# Patient Record
Sex: Male | Born: 1965 | Race: White | Hispanic: No | Marital: Single | Smoking: Never smoker
Health system: Southern US, Community
[De-identification: ages and names within clinical notes are randomized; demographics above are authoritative.]

## PROBLEM LIST (undated history)

## (undated) DIAGNOSIS — K649 Unspecified hemorrhoids: Secondary | ICD-10-CM

## (undated) DIAGNOSIS — I214 Non-ST elevation (NSTEMI) myocardial infarction: Secondary | ICD-10-CM

## (undated) DIAGNOSIS — T8859XA Other complications of anesthesia, initial encounter: Secondary | ICD-10-CM

## (undated) DIAGNOSIS — I10 Essential (primary) hypertension: Secondary | ICD-10-CM

## (undated) DIAGNOSIS — K8020 Calculus of gallbladder without cholecystitis without obstruction: Secondary | ICD-10-CM

## (undated) DIAGNOSIS — K824 Cholesterolosis of gallbladder: Secondary | ICD-10-CM

## (undated) DIAGNOSIS — T4145XA Adverse effect of unspecified anesthetic, initial encounter: Secondary | ICD-10-CM

## (undated) DIAGNOSIS — R112 Nausea with vomiting, unspecified: Secondary | ICD-10-CM

## (undated) DIAGNOSIS — I2510 Atherosclerotic heart disease of native coronary artery without angina pectoris: Secondary | ICD-10-CM

## (undated) DIAGNOSIS — Z9889 Other specified postprocedural states: Secondary | ICD-10-CM

## (undated) DIAGNOSIS — K219 Gastro-esophageal reflux disease without esophagitis: Secondary | ICD-10-CM

## (undated) DIAGNOSIS — F419 Anxiety disorder, unspecified: Secondary | ICD-10-CM

## (undated) HISTORY — DX: Anxiety disorder, unspecified: F41.9

## (undated) HISTORY — DX: Cholesterolosis of gallbladder: K82.4

## (undated) HISTORY — PX: HEMORROIDECTOMY: SUR656

## (undated) HISTORY — DX: Gastro-esophageal reflux disease without esophagitis: K21.9

## (undated) HISTORY — DX: Calculus of gallbladder without cholecystitis without obstruction: K80.20

## (undated) HISTORY — DX: Non-ST elevation (NSTEMI) myocardial infarction: I21.4

## (undated) HISTORY — PX: KNEE ARTHROSCOPY: SUR90

---

## 2001-02-12 ENCOUNTER — Encounter: Payer: Self-pay | Admitting: Orthopedic Surgery

## 2001-02-16 ENCOUNTER — Encounter: Payer: Self-pay | Admitting: Orthopedic Surgery

## 2001-02-16 ENCOUNTER — Ambulatory Visit (HOSPITAL_COMMUNITY): Admission: RE | Admit: 2001-02-16 | Discharge: 2001-02-16 | Payer: Self-pay | Admitting: Orthopedic Surgery

## 2001-02-24 ENCOUNTER — Ambulatory Visit (HOSPITAL_COMMUNITY): Admission: RE | Admit: 2001-02-24 | Discharge: 2001-02-24 | Payer: Self-pay | Admitting: Orthopedic Surgery

## 2002-01-27 ENCOUNTER — Ambulatory Visit (HOSPITAL_COMMUNITY): Admission: RE | Admit: 2002-01-27 | Discharge: 2002-01-27 | Payer: Self-pay | Admitting: Family Medicine

## 2003-03-17 ENCOUNTER — Ambulatory Visit (HOSPITAL_COMMUNITY): Admission: RE | Admit: 2003-03-17 | Discharge: 2003-03-17 | Payer: Self-pay | Admitting: General Surgery

## 2005-02-12 ENCOUNTER — Ambulatory Visit: Payer: Self-pay | Admitting: Orthopedic Surgery

## 2005-02-14 ENCOUNTER — Ambulatory Visit (HOSPITAL_COMMUNITY): Admission: RE | Admit: 2005-02-14 | Discharge: 2005-02-14 | Payer: Self-pay | Admitting: Orthopedic Surgery

## 2005-02-21 ENCOUNTER — Ambulatory Visit: Payer: Self-pay | Admitting: Orthopedic Surgery

## 2005-03-06 ENCOUNTER — Encounter (HOSPITAL_COMMUNITY): Admission: RE | Admit: 2005-03-06 | Discharge: 2005-04-05 | Payer: Self-pay | Admitting: Orthopedic Surgery

## 2005-03-12 ENCOUNTER — Ambulatory Visit: Payer: Self-pay | Admitting: Orthopedic Surgery

## 2007-12-26 ENCOUNTER — Ambulatory Visit (HOSPITAL_COMMUNITY): Admission: RE | Admit: 2007-12-26 | Discharge: 2007-12-26 | Payer: Self-pay | Admitting: Internal Medicine

## 2008-02-04 ENCOUNTER — Ambulatory Visit: Payer: Self-pay | Admitting: Internal Medicine

## 2008-02-06 HISTORY — PX: ESOPHAGOGASTRODUODENOSCOPY: SHX1529

## 2008-02-06 HISTORY — PX: BRAVO PH STUDY: SHX5421

## 2008-02-09 ENCOUNTER — Ambulatory Visit: Payer: Self-pay | Admitting: Internal Medicine

## 2008-02-09 ENCOUNTER — Ambulatory Visit (HOSPITAL_COMMUNITY): Admission: RE | Admit: 2008-02-09 | Discharge: 2008-02-09 | Payer: Self-pay | Admitting: Internal Medicine

## 2008-03-16 ENCOUNTER — Ambulatory Visit: Payer: Self-pay | Admitting: Internal Medicine

## 2008-09-16 ENCOUNTER — Ambulatory Visit: Payer: Self-pay | Admitting: Internal Medicine

## 2009-01-24 ENCOUNTER — Telehealth (INDEPENDENT_AMBULATORY_CARE_PROVIDER_SITE_OTHER): Payer: Self-pay | Admitting: *Deleted

## 2009-01-25 ENCOUNTER — Encounter: Payer: Self-pay | Admitting: Internal Medicine

## 2009-07-01 ENCOUNTER — Ambulatory Visit (HOSPITAL_COMMUNITY): Admission: RE | Admit: 2009-07-01 | Discharge: 2009-07-01 | Payer: Self-pay | Admitting: Family Medicine

## 2010-02-27 DIAGNOSIS — K219 Gastro-esophageal reflux disease without esophagitis: Secondary | ICD-10-CM | POA: Insufficient documentation

## 2010-02-27 DIAGNOSIS — K648 Other hemorrhoids: Secondary | ICD-10-CM | POA: Insufficient documentation

## 2010-02-27 DIAGNOSIS — K5900 Constipation, unspecified: Secondary | ICD-10-CM | POA: Insufficient documentation

## 2010-02-28 ENCOUNTER — Ambulatory Visit: Payer: Self-pay | Admitting: Internal Medicine

## 2010-03-09 ENCOUNTER — Encounter: Payer: Self-pay | Admitting: Gastroenterology

## 2010-09-21 ENCOUNTER — Encounter (INDEPENDENT_AMBULATORY_CARE_PROVIDER_SITE_OTHER): Payer: Self-pay | Admitting: *Deleted

## 2010-11-07 NOTE — Medication Information (Signed)
Summary: PA for dexilant  PA for dexilant   Imported By: Hendricks Limes LPN 16/07/9603 54:09:81  _____________________________________________________________________  External Attachment:    Type:   Image     Comment:   External Document

## 2010-11-07 NOTE — Assessment & Plan Note (Signed)
Summary: MEDICATIONS REFILLS/SS   Visit Type:  Follow-up Visit Primary Care Provider:  [facility]  Chief Complaint:  F/U medications.  History of Present Illness: Pleasant 45 year old gentleman with long-standing gastroesophageal reflux disease. Here for two-year followup. Symptoms well-controlled on Dexalant 60 mg orally daily. Over time, he notes if he misses even a single dose of Dexilant, within  24 hours, he has recurrent symptoms. He has not had any melena or dysphagia. He has gained almost 20 pounds since his last visit.  Due for routine screening colonoscopy at age 63. He has not had any intercurrent illnesses.  Current Medications (verified): 1)  Alprazolam 0.5 Mg Tabs (Alprazolam) .... As Needed 2)  Multivitamins  Tabs (Multiple Vitamin) .... Once Daily 3)  Dexilant 60 Mg .... Take 1 Tablet By Mouth Once A Day  Allergies (verified): No Known Drug Allergies  Past History:  Family History: Last updated: 02/28/2010 Father: Living age 69   healthy Mother: Living age 41  healthy Siblings: None  Social History: Last updated: 02/28/2010 Marital Status:single Children: none Occupation: Maintenance  Past Medical History: Reflux Anxiety Allergies  Past Surgical History: HEMORRHOIDECTOMY LEFT KNEE SURGERY  Family History: Father: Living age 71   healthy Mother: Living age 22  healthy Siblings: None  Social History: Marital Status:single Children: none Occupation: Maintenance  Vital Signs:  Patient profile:   45 year old male Height:      72 inches Weight:      197 pounds BMI:     26.81 Temp:     98.3 degrees F oral Pulse rate:   84 / minute BP sitting:   134 / 88  (left arm) Cuff size:   regular  Vitals Entered By: Cloria Spring LPN (Feb 28, 2010 10:21 AM)  Physical Exam  General:  pleasant alert gentleman in no acute distress Eyes:  no scleral icterus. Conjunctivae were pink Abdomen:  nondistended positive bowel sounds soft nontender without  appreciable mass or organomegaly  Impression & Recommendations: Impression: 45 year old gentleman who has  well controlled GERD. He is gaining weight. I reviewed the multi-pronged approach to reflux with this gentleman today.  Recommendations: Continue Dexalant 60 mg orally daily -  two-year prescription refill provided. I feel the benefits of this approach far outweigh the risks. Weight loss encouraged. Unless something comes up, will plan to see him back in 2 years.  Appended Document: Orders Update    Clinical Lists Changes  Orders: Added new Service order of Est. Patient Level III (16109) - Signed      Appended Document: MEDICATIONS REFILLS/SS reminder in computer

## 2010-11-09 NOTE — Letter (Signed)
Summary: Recall Office Visit  [facility]  [address]   [place], [place] [address]   Phone: [phone]  Fax: [phone]      September 21, 2010   NORMAL RECINOS [address] [place], [place]  [address] 1966/06/25   Dear Mr. Bussie,   According to our records, it is time for you to schedule a follow-up office visit with [place].   At your convenience, please call [phone] to schedule an office visit. If you have any questions, concerns, or feel that this letter is in error, we would appreciate your call.   Sincerely,    Diana Eves  [facility] Ph: [phone]   Fax: [phone]

## 2011-02-20 NOTE — Assessment & Plan Note (Signed)
NAMEMarland Kitchen  Eric Fisher, Eric Fisher              CHART#:  [id number]   DATE:  09/16/2008                       DOB:  1966-10-06   FOLLOWUP:  Gastroesophageal reflux disease.   The patient was last seen on 03/16/2008, in followup who is having  refractory reflux symptoms manifested primarily by atypical chest pain.  Prior cardiac workup negative. The Bravo pH study demonstrated abnormal  gastroesophageal reflux.  These symptoms, however, now have been nicely  squelched with Kapidex 60 mg orally daily.  He is essentially  asymptomatic these days.  He tells me he was taking Kapidex first thing  in the morning on 2 occasions since June and had recurrence of his  symptoms by me today.  He is not having odynophagia or dysphagia.  Overall, he is very pleased with his response with Kapidex.  There is no  family history of GI malignancies.  No tobacco or alcohol use.   PHYSICAL EXAMINATION:  GENERAL:  Today, a pleasant 45 year old  gentleman, resting comfortably.  VITAL SIGNS:  Weight 178.5, height 6 feet, temperature 97.8, BP 120/78,  and pulse 72.  SKIN:  Warm and dry.  ABDOMEN:  Flat, positive bowel sounds, soft, nontender without  appreciable mass or organomegaly.   ASSESSMENT:  Gastroesophageal reflux disease with atypical chest pain.  His primary symptom resolved on Kapidex 60 mg orally daily.  We had a  discussion about multiform approaches for treatment of gastroesophageal  reflux disease.  Given his relatively young age, I did talk him briefly  about the option of antireflux procedure unless it could potentially  give him a long-lasting symptom relief, but likely would not be a  lifelong fix.  The potential for new problems related reflux surgery are  also discussed.  I told the patient it was entirely safe for him to plan  to take Kapidex for a long haul, if he did not mind taking an extra  capsule daily.  He is variable content with taking this approach for the  time being.  He consequently  have recommended he just simply stay on  Kapidex 60 mg orally daily.  He will see that he has refills for the  next 2 years.  Unless something comes up, I will touch base within 2  years from now.      [name], M.D.  Electronically Signed    RMR/MEDQ  D:  09/16/2008  T:  09/16/2008  Job:  [id number]   cc:   Patrica Duel, M.D.

## 2011-02-20 NOTE — Consult Note (Signed)
NAME:  Eric Fisher, PANIK NO.:  [id number]   MEDICAL RECORD NO.:  [id number]          PATIENT TYPE:  AMB   LOCATION:  DAY                           FACILITY:  [facility]   PHYSICIAN:  R. Roetta Sessions, M.D. DATE OF BIRTH:  [birth year]   DATE OF CONSULTATION:  02/04/2008  DATE OF DISCHARGE:                                 CONSULTATION   REASON FOR CONSULTATION:  Noncardiac chest pain.   HISTORY OF PRESENT ILLNESS:  The patient is a 45 year old Caucasian  gentleman who presents today for further evaluation of what was thought  to be a noncardiac chest pain.  He says over the past 9 weeks, he has  developed chest tightening and discomfort in his chest.  He states he  feels like he has a 25-pound weight on his chest.  He states the pain is  constant.  If he plays golf, it seems to be a little bit worse.  However, he states he is able to walk and exert himself without any  increased discomfort.  He denies any shortness of breath.  When this  occurs, he feels nauseated and loses his appetite.  He states in the  beginning, he lost about 12 to 15 pounds within the first couple of week  of these symptoms.  He denies any vomiting.  Every 3 to 4 days, he will  also have a sharper pain, which migrates throughout the chest left to  the right side.  He denies any typical heartburn or indigestion.  He has  had sensation of something in his throat over the past several weeks.  This seems to be unrelated to his chest discomfort.  He sometimes does a  hard cough and has a thick mucus come out.  He says his food is going  down okay.  His bowel movements are regular.  He denies any blood in the  stool.  He has been tried on Aciphex for about 2 weeks, Protonix for  about 3 weeks, currently on omeprazole, and has not noticed any  improvement with any of these medications.  He has taken Maalox on one  occasion which seemed to help but has not helped on subsequent  occasions.  He has been  evaluated by Dr. Allyson Sabal.  He reports having an  unremarkable echocardiogram and stress test.  He states all of his blood  work and thyroid check was normal.  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  Labs from December 24, 2007, revealed a normal CBC,  sed rate, the fibrin D-dimer was 0.22.  MET 7, LFTs, and TSH all were  normal.   CURRENT MEDICATIONS:  1. Omeprazole 20 mg daily.  2. Xanax 0.5 mg p.r.n.  3. Fluticasone 50 mcg daily.   ALLERGIES:  No known drug allergies.   PAST MEDICAL HISTORY:  1. He was treated for sinus infection several weeks ago.  2. History of hemorrhoids.  3. He has had hemorrhoid surgery.  4. Left knee surgery.  5. The patient has had a flexible sigmoidoscopy in February 2001,      which revealed internal hemorrhoids, otherwise normal study to 50      cm.   FAMILY HISTORY:  Negative for chronic GI illnesses and colorectal  cancers.   SOCIAL HISTORY:  He is single.  He does maintenance work.  He has never  been a smoker.  No alcohol use.   REVIEW OF SYSTEMS:  See HPI for GI.  CONSTITUTIONAL:  See HPI.  CARDIOPULMONARY:  See HPI.  GENITOURINARY:  No dysuria or hematuria.   PHYSICAL EXAMINATION:  VITAL SIGNS:  Weight 169, height 6 feet,  temperature 98.1, blood pressure 120/88, and pulse 88.  GENERAL:  Pleasant, well-nourished, and well-developed Caucasian  gentleman in no acute distress.  SKIN:  Warm and dry.  No jaundice.  HEENT:  Sclerae nonicteric.  Oropharyngeal mucosa moist and pink.  No  lesions, erythema, or exudate.  NECK:  No lymphadenopathy or thyromegaly.  CHEST:  Lungs are clear to auscultation.  CARDIAC:  Reveals regular rate and rhythm.  Normal S1 and S2.  No  murmurs, rubs, or gallops.  ABDOMEN:  Positive bowel sounds.   Abdomen is soft, nontender, and  nondistended.  No organomegaly or masses.  No rebound or guarding.  No  abdominal bruits or hernias.  LOWER EXTREMITIES:  No edema.   IMPRESSION:  Shrihaan is a 46 year old gentleman with 8- to 9-week  history of atypical chest pain.  He describes having tightness in his  chest as well as feeling like a weight on his chest.  His symptoms are  not exertional.  He has associated nausea and anorexia.  He had a 12- to  15-pound weight loss initially, but this has now stabilized.  He also  describes globus-type symptoms.  He denies any dysphagia.  He denies any  typical heartburn-like symptoms.  He has not responded to PPI therapy as  outlined above.  I feel that it is unlikely that his chest pain is  related to reflux, given that he has been on multiple PPIs without any  response.   PLAN:  1. EGD in the near future with Dr. Jena Gauss.  If his EGD is normal      endoscopically, then we would proceed Bravo pH testing off the PPI      therapy.  Therefore, we will have the patient hold his omeprazole      for now.  2. Further recommendations to follow.   I would like to thank Dr. Jena Gauss  for allowing [place] to take part in the  care of this patient.      Tana Coast, P.AJonathon Bellows, M.D.  Electronically Signed    LL/MEDQ  D:  02/04/2008  T:  02/05/2008  Job:  [id number]   cc:   Patrica Duel, M.D.  Fax: [phone]

## 2011-02-20 NOTE — Op Note (Signed)
NAME:  Eric Fisher, Eric Fisher NO.:  [id number]   MEDICAL RECORD NO.:  [id number]          PATIENT TYPE:  AMB   LOCATION:  DAY                           FACILITY:  [facility]   PHYSICIAN:  R. Roetta Sessions, M.D. DATE OF BIRTH:  14-Jan-1966   DATE OF PROCEDURE:  02/09/2008  DATE OF DISCHARGE:                               OPERATIVE REPORT   Esophagogastroduodenoscopy with Bravo probe placement.   INDICATIONS FOR PROCEDURE:  A 45 year old gentleman with atypical chest  pain.  Cardiac workup has been unrevealing.  He does have typical reflux  symptoms and really has not had any meaningful response to the empiric  acid suppression therapy.  EGD with Bravo placement as appropriate is  now being done.  Risks, benefits, and alternatives have been reviewed.  Please see documentation in the medical record.   PROCEDURE NOTE:  O2 saturation, blood pressure, pulse, and respirations  were monitored throughout the entire procedure.   CONSCIOUS SEDATION:  Versed 7 mg IV and Demerol 125 mg IV in divided  doses.   INSTRUMENT:  Pentax video chip system.   FINDINGS:  Examination of the tubular esophagus revealed normal-  appearing mucosa.  EG junction identified at 41 cm/42 cm from the  incisors with the stomach inflated and deflated respectively.  The EG  junction was easily traversed.  Stomach:  Gastric cavity was empty and  insufflated well with air.  Thorough examination of the gastric mucosa  including retroflex view of the proximal stomach and esophagogastric  junction demonstrated only a small hiatal hernia.  Pylorus was patent  and easily traversed.  Examination of the bulb and second portion  revealed no abnormalities.   THERAPEUTIC/DIAGNOSTIC MANEUVERS PERFORMED:  Scope was withdrawn.  The  Bravo introducing deployment catheter was obtained after the Bravo probe  had been calibrated.  It was advanced blindly to 36 cm with the  incisors.  It was deployed in standard fashion.  A  look back revealed  the probe was indeed attached to the mucosa at the proper level.  The  patient tolerated the procedure well.   IMPRESSION:  Normal esophagus, small hiatal hernia, otherwise normal  stomach, D1 and D2, status post Bravo pH probe placement as described  above.   RECOMMENDATIONS:  No acid suppression whatsoever for the next 2 days  (during the stay).  The patient is to go back to his usual routine.  Keep the diary as instructed by nursing staff.  Further recommendations  to follow.      [name], M.D.  Electronically Signed     RMR/MEDQ  D:  02/09/2008  T:  02/10/2008  Job:  [id number]   cc:   Patrica Duel, M.D.  Fax: [phone]

## 2011-02-20 NOTE — Assessment & Plan Note (Signed)
NAME:  Eric Fisher, Eric Fisher              CHART#:  [id number]   DATE:  03/16/2008                       DOB:  10/15/1965   PRIMARY CARE PHYSICIAN:  Patrica Duel, MD.   CHIEF COMPLAINT:  Followup refractory GERD/atypical chest pain.   PROBLEM LIST:  1. Chronic refractory GERD with positive Bravo pH study by Dr. Jena Gauss      on Feb 09, 2008.  He had abnormal GERD documented during the study      and 2/3 episodes of chest pain correlated with reflux.  2. Constipation since the addition of Kapidex.  3. Hemorrhoidectomy.  4. Flexible sigmoidoscopy in February 2001, internal hemorrhoids;      otherwise, normal exam to 50 cm.   SUBJECTIVE:  Eric Fisher is a 45 year old Caucasian male.  He had recent  abnormal Bravo pH study off PPI.  He has been taking Kapidex 60 mg  b.i.d. for over a month now.  He is feeling much better.  He did have  one episode where he went out to eat pizza and a salad of pineapple and  pepperonis.  He did seem to have some chest pain.  He also notes that  this morning he began to note some chest tightness as well.  The  tightness is not associated with any shortness of breath, cough,  wheezing, dizziness, or palpitations.  He has previously had a normal  cardiac evaluation by [facility] which included a stress  test, echocardiogram, and chest CT; all of which were benign.  He denies  any abdominal pain, does have some transient nausea with these episodes,  but denies any vomiting.  His weight has remained stable.  His appetite  is good.  He denies any dysphagia or odynophagia.  He has noted some  constipation.  He can go up to 4 days without bowel movement since he  started Kapidex; however, he began using fiber 1 cereal and this is no  longer a problem.   CURRENT MEDICATIONS:  See the list from March 16, 2008.   ALLERGIES:  No known drug allergies.   OBJECTIVE:  VITAL SIGNS:  Weight 167.5 pounds, height 72 inches,  temperature 98.1, blood pressure 118/80,  and pulse 80.  GENERAL:  Eric Fisher is a well-developed, well-nourished Caucasian  male, in no acute distress.  HEENT:  Sclerae nonicteric.  Conjunctivae pink.  Oropharynx pink and  moist without any lesions.  NECK:  Supple without mass or thyromegaly.  CHEST:  Heart is regular rate and rhythm.  Normal S1, S2.  No murmurs,  clicks, rubs, or gallops.  Lungs are clear to auscultation bilaterally.  ABDOMEN:  Positive bowel sounds x4.  No bruits auscultated.  Soft,  nontender, and nondistended.  No palpable mass or hepatosplenomegaly.  No rebound, tenderness, or guarding.  EXTREMITIES:  Without clubbing or edema bilaterally.   ASSESSMENT:  Eric Fisher is a 45 year old male with refractory  gastroesophageal reflux disease, which has responded to b.i.d. proton  pump inhibitor.  He does have persistent symptom of intermittent chest  pressure, which is not necessarily related to movement and no associated  factors, such as diaphoresis, shortness of breath, cough, wheezing,  dizziness, or palpitations.  Much of this has resolved since he began  b.i.d. proton pump inhibitor; however, I am not completely convinced  that this is all due to  gastroesophageal reflux disease.   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   Constipation with Kapidex.   PLAN:  1. Fiber supplement of choice.  2. GERD diet and constipation literature given for his review.  3. Continue Kapidex 60 mg.  We will decrease to once daily since he      has relatively good control of his symptoms, #90, with 3 refills.  4. .  If his chest pressure persists, he is going to call for further      evaluation.       Lorenza Burton, N.P.  Electronically Signed     R. Roetta Sessions, M.D.  Electronically Signed    KJ/MEDQ  D:  03/16/2008  T:  03/17/2008  Job:  [id number]   cc:   Patrica Duel, M.D.

## 2011-02-20 NOTE — Op Note (Signed)
NAME:  Eric Fisher, Eric Fisher NO.:  [id number]   MEDICAL RECORD NO.:  [id number]          PATIENT TYPE:  AMB   LOCATION:  DAY                           FACILITY:  [facility]   PHYSICIAN:  R. Roetta Sessions, M.D. DATE OF BIRTH:  10-05-1966   DATE OF PROCEDURE:  02/09/2008  DATE OF DISCHARGE:  02/09/2008                               OPERATIVE REPORT   INDICATIONS FOR PROCEDURE:  Chest pain with unclear etiology.  This is a  45 year old gentleman with a 9-week history of atypical chest pain.  Cardiac workup negative.  He has had some anorexia and nausea.  He has  failed to improve on short-term aspiration therapy in the way of  Protonix and Aciphex.  He has had no dysphagia.  A 48-hour ambulatory pH  probe study is now being done off acid suppression therapy.  This  approach has been discussed with the patient at length.   FINDINGS:  EGD Feb 09, 2008.  Please see dictated procedure note.  The  patient had a small hiatal hernia.  This procedure was done off acid  suppression therapy.  Findings on day #1:  Number of reflux episodes  155, number of long reflux symptoms greater than 5 minutes 1, duration  of longest reflux episode minutes 29.  Time of pH less than 4 minutes  131, fraction time pH less than four 10.4, DeMeester score 41.7.  Day  #2:  Number of reflux episodes 51, number of longest reflux episode  greater than 5 minutes 2, duration of longest reflux in minutes 9, pH  less than 4 minutes 41, fraction time pH less than four 3.3, DeMeester  score 12.4.   On reviewing the patient's diary, the patient had 3 episodes of chest  pain during the study.  Two of which correlated with reflux, one did  not.  The patient was otherwise virtually asymptomatic during the study.   IMPRESSION:  The patient has abnormal gastroesophageal reflux documented  during the study.   However, he only had 3 episodes of chest pain during the 2 days.  Two of  which were correlated with episodes of  reflux.   RECOMMENDATIONS:  The patient deserves a longer course of more  aggressive acid suppression therapy.  Acid reflux may be a major  contributing factor to his recent chest pain.  I specifically recommend  Capadex 60 mg orally twice daily for the next month.  We will see him  back in the office in 1 month to assess his progress.      [name], M.D.  Electronically Signed     RMR/MEDQ  D:  02/16/2008  T:  02/16/2008  Job:  [id number]

## 2011-02-23 NOTE — H&P (Signed)
   NAME:  Eric Fisher, Eric Fisher                       ACCOUNT NO.:  [id number]   MEDICAL RECORD NO.:  [id number]                   PATIENT TYPE:  AMB   LOCATION:  DAY                                  FACILITY:  [facility]   PHYSICIAN:  Dalia Heading, M.D.               DATE OF BIRTH:  January 17, 1966   DATE OF ADMISSION:  DATE OF DISCHARGE:                                HISTORY & PHYSICAL   CHIEF COMPLAINT:  Thrombosed hemorrhoid.   HISTORY OF PRESENT ILLNESS:  The patient is a 45 year old white male who was  referred for evaluation, treatment of a thrombosed hemorrhoid.  He has had  hemorrhoidal problems in the past, but recently the rectal pain has been  worsening over the past three days.  He does notice a swelling in the  rectum.  No active bleeding is noted.   PAST MEDICAL HISTORY:  Unremarkable.   PAST SURGICAL HISTORY:  Knee surgery.   CURRENT MEDICATIONS:  A pain medication.   ALLERGIES:  No known drug allergies.   REVIEW OF SYSTEMS:  Noncontributory.   PHYSICAL EXAMINATION:  GENERAL:  The patient is a well-developed, well-  nourished,  white male in no acute distress.  VITAL SIGNS:  He is afebrile and vital signs are stable.  LUNGS:  Clear to auscultation with equal breath sounds bilaterally.  HEART:  Reveals a regular rate and rhythm without S3, S4 or murmurs.  RECTAL:  Reveals a thrombosed hemorrhoid noted along the left lateral aspect  of the anus.  It is tender to touch.  Examination is limited secondary to  pain.   IMPRESSION:  Thrombosed hemorrhoid.    PLAN:  The patient was scheduled for a hemorrhoidectomy on March 17, 2003.  The risks and benefits of the procedure including bleeding, infection and  recurrence of the hemorrhoids were fully explained to the patient.  He gave  informed consent.                                                 Dalia Heading, M.D.    MAJ/MEDQ  D:  03/16/2003  T:  03/16/2003  Job:  [id number]   cc:   Patrica Duel, M.D.  [address], [address]  [place]  [place] [address]  Fax: [phone]

## 2011-02-23 NOTE — Op Note (Signed)
   NAME:  OZ, GAMMEL                       ACCOUNT NO.:  [id number]   MEDICAL RECORD NO.:  [id number]                   PATIENT TYPE:  AMB   LOCATION:  DAY                                  FACILITY:  [facility]   PHYSICIAN:  Dalia Heading, M.D.               DATE OF BIRTH:  May 30, 1966   DATE OF PROCEDURE:  03/17/2003  DATE OF DISCHARGE:                                 OPERATIVE REPORT   PREOPERATIVE DIAGNOSIS:  Thrombosed hemorrhoid.   POSTOPERATIVE DIAGNOSIS:  Thrombosed hemorrhoid.   PROCEDURE:  Internal and external hemorrhoidectomy.   SURGEON:  Dalia Heading, M.D.   ANESTHESIA:  General   INDICATIONS:  The patient is a 45 year old white male who presents with a  thrombosed hemorrhoid along the left lateral aspect of the anus.  The risks  and benefits of the procedure including bleeding, infection, pain, and the  possibility of recurrence of the hemorrhoid were fully explained to the  patient, who gave informed consent.   DESCRIPTION OF PROCEDURE:  The patient was placed in the lithotomy position  after general anesthesia was administered. The perineum was prepped and  draped usual sterile technique with Betadine.  Surgical site confirmation  was performed.   The patient had a large internal and external hemorrhoid noted along the  left lateral aspect of the anus. This was excised sharply without  difficulty.  The mucosal edges were reapproximated using a running 2-0  Vicryl running suture.  The rest of the rectum and anus were within normal  limits.  Sensorcaine 0.5% was instilled into the surrounding wound.  No  abnormal bleeding was noted at the end of the procedure.  The rectum was  packed with Surgicel and viscous Xylocaine.   All tape and needle counts correct at the end of the procedure.  The patient  was awakened and transferred to PACU in stable condition.   COMPLICATIONS:  None.    SPECIMEN:  Thrombosed hemorrhoid.   BLOOD LOSS:  Minimal.                                         Dalia Heading, M.D.    MAJ/MEDQ  D:  03/17/2003  T:  03/17/2003  Job:  [id number]   cc:   Patrica Duel, M.D.  [address]  [place]  [place] [address]  Fax: [phone]

## 2011-03-09 DIAGNOSIS — I214 Non-ST elevation (NSTEMI) myocardial infarction: Secondary | ICD-10-CM

## 2011-03-09 HISTORY — DX: Non-ST elevation (NSTEMI) myocardial infarction: I21.4

## 2011-03-11 ENCOUNTER — Inpatient Hospital Stay (HOSPITAL_COMMUNITY)
Admission: EM | Admit: 2011-03-11 | Discharge: 2011-03-16 | DRG: 122 | Disposition: A | Discharge status: Home / Self Care | Payer: BC Managed Care – PPO | Source: Other Acute Inpatient Hospital | Attending: Internal Medicine | Admitting: Internal Medicine

## 2011-03-11 ENCOUNTER — Emergency Department (HOSPITAL_COMMUNITY)
Admission: EM | Admit: 2011-03-11 | Discharge: 2011-03-11 | Disposition: A | Discharge status: Other Acute Inpatient Hospital | Payer: BC Managed Care – PPO | Attending: Emergency Medicine | Admitting: Emergency Medicine

## 2011-03-11 ENCOUNTER — Emergency Department (HOSPITAL_COMMUNITY): Payer: BC Managed Care – PPO

## 2011-03-11 DIAGNOSIS — I2510 Atherosclerotic heart disease of native coronary artery without angina pectoris: Secondary | ICD-10-CM

## 2011-03-11 DIAGNOSIS — R079 Chest pain, unspecified: Secondary | ICD-10-CM

## 2011-03-11 DIAGNOSIS — R7309 Other abnormal glucose: Secondary | ICD-10-CM | POA: Diagnosis present

## 2011-03-11 DIAGNOSIS — Z7901 Long term (current) use of anticoagulants: Secondary | ICD-10-CM

## 2011-03-11 DIAGNOSIS — R0602 Shortness of breath: Secondary | ICD-10-CM | POA: Insufficient documentation

## 2011-03-11 DIAGNOSIS — I219 Acute myocardial infarction, unspecified: Secondary | ICD-10-CM | POA: Insufficient documentation

## 2011-03-11 DIAGNOSIS — K219 Gastro-esophageal reflux disease without esophagitis: Secondary | ICD-10-CM | POA: Diagnosis present

## 2011-03-11 DIAGNOSIS — Z7982 Long term (current) use of aspirin: Secondary | ICD-10-CM

## 2011-03-11 DIAGNOSIS — I214 Non-ST elevation (NSTEMI) myocardial infarction: Principal | ICD-10-CM | POA: Diagnosis present

## 2011-03-11 HISTORY — PX: CARDIAC CATHETERIZATION: SHX172

## 2011-03-11 LAB — BASIC METABOLIC PANEL
CO2: 24 mEq/L (ref 19–32)
Chloride: 105 mEq/L (ref 96–112)
Creatinine, Ser: 0.7 mg/dL (ref 0.4–1.5)
GFR calc Af Amer: >60 mL/min (ref >60)
Glucose, Bld: 130 mg/dL — ABNORMAL HIGH (ref 70–99)

## 2011-03-11 LAB — DIFFERENTIAL
Basophils Relative: 0 % (ref 0–1)
Eosinophils Absolute: 0.1 10*3/uL (ref 0.0–0.7)
Eosinophils Relative: 1 % (ref 0–5)
Monocytes Absolute: 0.5 10*3/uL (ref 0.1–1.0)
Monocytes Relative: 6 % (ref 3–12)
Neutro Abs: 6 10*3/uL (ref 1.7–7.7)

## 2011-03-11 LAB — CARDIAC PANEL(CRET KIN+CKTOT+MB+TROPI)
Relative Index: 9.3 — ABNORMAL HIGH (ref 0.0–2.5)
Troponin I: >25 ng/mL (ref <0.30)

## 2011-03-11 LAB — CBC
Hemoglobin: 13 g/dL (ref 13.0–17.0)
MCH: 29.9 pg (ref 26.0–34.0)
MCHC: 34.6 g/dL (ref 30.0–36.0)
RDW: 12.8 % (ref 11.5–15.5)

## 2011-03-11 LAB — GLUCOSE, CAPILLARY: Glucose-Capillary: 99 mg/dL (ref 70–99)

## 2011-03-11 LAB — MRSA PCR SCREENING: MRSA by PCR: NEGATIVE

## 2011-03-11 LAB — CK TOTAL AND CKMB (NOT AT ARMC)
CK, MB: 32.4 ng/mL (ref 0.3–4.0)
Relative Index: 8.5 — ABNORMAL HIGH (ref 0.0–2.5)
Total CK: 241 U/L — ABNORMAL HIGH (ref 7–232)

## 2011-03-12 LAB — CBC
Platelets: 145 10*3/uL — ABNORMAL LOW (ref 150–400)
RBC: 4.22 MIL/uL (ref 4.22–5.81)
WBC: 7.5 10*3/uL (ref 4.0–10.5)

## 2011-03-12 LAB — LIPID PANEL
HDL: 41 mg/dL (ref >39)
LDL Cholesterol: 93 mg/dL (ref 0–99)
Triglycerides: 125 mg/dL (ref <150)

## 2011-03-12 LAB — BASIC METABOLIC PANEL
GFR calc Af Amer: >60 mL/min (ref >60)
GFR calc non Af Amer: >60 mL/min (ref >60)
Potassium: 3.6 mEq/L (ref 3.5–5.1)
Sodium: 139 mEq/L (ref 135–145)

## 2011-03-12 LAB — HEPARIN LEVEL (UNFRACTIONATED): Heparin Unfractionated: 0.3 IU/mL (ref 0.30–0.70)

## 2011-03-13 DIAGNOSIS — I219 Acute myocardial infarction, unspecified: Secondary | ICD-10-CM

## 2011-03-13 HISTORY — PX: TRANSTHORACIC ECHOCARDIOGRAM: SHX275

## 2011-03-13 LAB — HEPARIN LEVEL (UNFRACTIONATED): Heparin Unfractionated: 0.59 IU/mL (ref 0.30–0.70)

## 2011-03-13 NOTE — Consult Note (Signed)
NAME:  Eric Fisher, Eric Fisher NO.:  [id number]  MEDICAL RECORD NO.:  [id number]           PATIENT TYPE:  I  LOCATION:  2920                         FACILITY:  [facility]  PHYSICIAN:  Salvatore Decent. Cornelius Moras, M.D. DATE OF BIRTH:  [birth year]  DATE OF CONSULTATION:  03/11/2011 DATE OF DISCHARGE:                                CONSULTATION   REQUESTING PHYSICIAN:  Arturo Morton. 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  HISTORY OF PRESENT ILLNESS:  Eric Fisher is a 45 year old previously healthy white male with no risk factors for coronary artery disease. The patient presented to [facility] after he was awoke from his sleep last night with severe pain in his left elbow that ultimately radiated to the left upper chest.  Pain was associated with some mild nausea but no shortness of breath or diaphoresis.  The patient took baby aspirin but continued to have pain and ultimately went to the emergency department at [facility].  Initial electrocardiogram has demonstrated sinus tachycardia with slight ST-segment elevation in lead III and lead aVF.  Repeat EKG 2 hours later showed some slight improvement the cardiac enzymes were abnormal with a total CK of 241 with a CK-MB of 14 and a troponin-I of 1.5.  Followup enzymes went up further with total CK 380, CK-MB 32, and troponin-I 3.4.  The patient was admitted to [facility] and underwent cardiac catheterization earlier today by Dr. Riley Kill.  He was found to have a large ectatic proximal left anterior descending coronary artery.  There is subtotal occlusion of first septal perforating branch.  There is some suggestion on some pictures that there may be clot or plaque in the left anterior descending coronary artery but there is no flow-limiting lesions.  There may be some lesion at the proximal takeoff of a large diagonal branch but again no flow-limiting lesions.  No  other significant coronary artery disease was noted and left ventricular function is normal. Cardiothoracic surgical consultation was requested.  REVIEW OF SYSTEMS:  Documented in the chart and otherwise unremarkable.  PAST MEDICAL HISTORY:  Notable only for GE reflux disease.  The patient has no previous history of hypertension, hyperlipidemia, diabetes, nor heart disease.  PAST SURGICAL HISTORY:  Notable for arthroscopic surgery of left knee and hemorrhoidectomy.  FAMILY HISTORY:  Noncontributory.  MEDICATIONS AT HOME:  Dexilant 60 mg daily.  DRUG ALLERGIES:  None known.  SOCIAL HISTORY:  The patient lives in [place], works in Production designer, theatre/television/film. He is a nonsmoker.  He only rarely uses alcohol.  PHYSICAL EXAMINATION:  GENERAL:  Notable for well-appearing male who appears his stated age in no acute distress.  He is without chest pain. VITAL SIGNS:  Blood pressure is normal.  He is in sinus rhythm. HEENT:  Unrevealing. CHEST:  Clear to auscultation.  No wheezes, rales, or rhonchi are noted. CARDIOVASCULAR:  Notable for regular rate and rhythm. ABDOMEN:  Soft, nontender. EXTREMITIES:  Warm and well perfused.  There is no lower extremity edema.  LABORATORY DATA:  Notable only for abnormal cardiac enzymes as noted previously.  All other  laboratory data are normal.  Cardiac catheterization performed by Dr. Riley Kill is reviewed.  This demonstrates a very large somewhat ectatic proximal left anterior descending coronary artery.  There is no flow-limiting lesion in this vessel at all.  There is subtotal occlusion of first septal perforating branch.  There may be some clot and/or plaque in the wall of the proximal left anterior descending coronary artery as suggested on some views, but there is clearly no flow-limiting lesion.  There is no aneurysm at all.  There may be some plaque at the ostium of the diagonal branch but again no flow-limiting stenosis.  There is normal  left ventricular function.  All of the other coronary artery anatomy is normal.  IMPRESSION:  I suspect the first septal perforating branch is the culprit for the patient's acute coronary syndrome and mildly positive cardiac enzymes.  The possibility of embolization cannot be ruled out. At this point, I cannot recommend surgical intervention.  I agree with plans for delayed repeat catheterization with possible intracoronary ultrasound to further sort out the coronary anatomy, but I am skeptical that surgery will play a role in this gentleman's care.  RECOMMENDATIONS:  Please contact us if repeat catheterization demonstrate findings that suggest that this patient might benefit from surgical revascularization.     Salvatore Decent. Cornelius Moras, M.D.     CHO/MEDQ  D:  03/11/2011  T:  03/12/2011  Job:  [id number]  cc:   Arturo Morton. Riley Kill, MD, [facility] [name], M.D.  Electronically Signed by Tressie Stalker M.D. on 03/13/2011 03:48:51 PM

## 2011-03-14 LAB — BASIC METABOLIC PANEL
BUN: 10 mg/dL (ref 6–23)
Calcium: 8.9 mg/dL (ref 8.4–10.5)
Creatinine, Ser: 0.71 mg/dL (ref 0.4–1.5)
GFR calc non Af Amer: >60 mL/min (ref >60)
Glucose, Bld: 96 mg/dL (ref 70–99)
Potassium: 3.5 mEq/L (ref 3.5–5.1)

## 2011-03-14 LAB — CBC
HCT: 39.8 % (ref 39.0–52.0)
MCH: 30.4 pg (ref 26.0–34.0)
MCHC: 35.7 g/dL (ref 30.0–36.0)
MCV: 85.2 fL (ref 78.0–100.0)
RBC: 4.67 MIL/uL (ref 4.22–5.81)
RDW: 13.1 % (ref 11.5–15.5)
WBC: 7.1 10*3/uL (ref 4.0–10.5)

## 2011-03-14 LAB — HEPARIN LEVEL (UNFRACTIONATED)

## 2011-03-15 LAB — CBC
MCHC: 34.9 g/dL (ref 30.0–36.0)
Platelets: 160 10*3/uL (ref 150–400)
RDW: 13.1 % (ref 11.5–15.5)
WBC: 6.1 10*3/uL (ref 4.0–10.5)

## 2011-03-15 LAB — HEPARIN LEVEL (UNFRACTIONATED): Heparin Unfractionated: 0.38 IU/mL (ref 0.30–0.70)

## 2011-03-15 LAB — BASIC METABOLIC PANEL
Calcium: 8.6 mg/dL (ref 8.4–10.5)
Creatinine, Ser: 0.65 mg/dL (ref 0.4–1.5)
GFR calc Af Amer: >60 mL/min (ref >60)
GFR calc non Af Amer: >60 mL/min (ref >60)
Sodium: 138 mEq/L (ref 135–145)

## 2011-03-16 DIAGNOSIS — I214 Non-ST elevation (NSTEMI) myocardial infarction: Secondary | ICD-10-CM

## 2011-03-16 LAB — CBC
Hemoglobin: 14.3 g/dL (ref 13.0–17.0)
MCH: 29.5 pg (ref 26.0–34.0)
Platelets: 172 10*3/uL (ref 150–400)
RBC: 4.84 MIL/uL (ref 4.22–5.81)

## 2011-03-16 LAB — PROTIME-INR
INR: 1.07 (ref 0.00–1.49)
Prothrombin Time: 14.1 seconds (ref 11.6–15.2)

## 2011-03-20 ENCOUNTER — Ambulatory Visit (INDEPENDENT_AMBULATORY_CARE_PROVIDER_SITE_OTHER): Payer: BC Managed Care – PPO | Admitting: *Deleted

## 2011-03-20 DIAGNOSIS — I749 Embolism and thrombosis of unspecified artery: Secondary | ICD-10-CM

## 2011-03-21 DIAGNOSIS — I749 Embolism and thrombosis of unspecified artery: Secondary | ICD-10-CM | POA: Insufficient documentation

## 2011-03-22 ENCOUNTER — Ambulatory Visit (INDEPENDENT_AMBULATORY_CARE_PROVIDER_SITE_OTHER): Payer: BC Managed Care – PPO | Admitting: *Deleted

## 2011-03-22 DIAGNOSIS — I749 Embolism and thrombosis of unspecified artery: Secondary | ICD-10-CM

## 2011-03-22 LAB — POCT INR: INR: 2.7

## 2011-03-25 ENCOUNTER — Emergency Department (HOSPITAL_COMMUNITY): Payer: BC Managed Care – PPO

## 2011-03-25 ENCOUNTER — Inpatient Hospital Stay (HOSPITAL_COMMUNITY)
Admission: EM | Admit: 2011-03-25 | Discharge: 2011-03-28 | DRG: 247 | Disposition: A | Discharge status: Home / Self Care | Payer: BC Managed Care – PPO | Source: Other Acute Inpatient Hospital | Attending: Cardiovascular Disease | Admitting: Cardiovascular Disease

## 2011-03-25 ENCOUNTER — Emergency Department (HOSPITAL_COMMUNITY)
Admission: EM | Admit: 2011-03-25 | Discharge: 2011-03-25 | Disposition: A | Discharge status: Home / Self Care | Payer: BC Managed Care – PPO | Source: Home / Self Care | Attending: Emergency Medicine | Admitting: Emergency Medicine

## 2011-03-25 DIAGNOSIS — K219 Gastro-esophageal reflux disease without esophagitis: Secondary | ICD-10-CM | POA: Diagnosis present

## 2011-03-25 DIAGNOSIS — R209 Unspecified disturbances of skin sensation: Secondary | ICD-10-CM | POA: Diagnosis present

## 2011-03-25 DIAGNOSIS — Z79899 Other long term (current) drug therapy: Secondary | ICD-10-CM | POA: Insufficient documentation

## 2011-03-25 DIAGNOSIS — I252 Old myocardial infarction: Secondary | ICD-10-CM

## 2011-03-25 DIAGNOSIS — I2510 Atherosclerotic heart disease of native coronary artery without angina pectoris: Secondary | ICD-10-CM | POA: Diagnosis present

## 2011-03-25 DIAGNOSIS — Z7982 Long term (current) use of aspirin: Secondary | ICD-10-CM | POA: Insufficient documentation

## 2011-03-25 DIAGNOSIS — Z7901 Long term (current) use of anticoagulants: Secondary | ICD-10-CM

## 2011-03-25 DIAGNOSIS — I209 Angina pectoris, unspecified: Secondary | ICD-10-CM | POA: Insufficient documentation

## 2011-03-25 DIAGNOSIS — M79609 Pain in unspecified limb: Principal | ICD-10-CM | POA: Diagnosis present

## 2011-03-25 DIAGNOSIS — E785 Hyperlipidemia, unspecified: Secondary | ICD-10-CM | POA: Diagnosis present

## 2011-03-25 LAB — CK TOTAL AND CKMB (NOT AT ARMC): Total CK: 87 U/L (ref 7–232)

## 2011-03-25 LAB — CARDIAC PANEL(CRET KIN+CKTOT+MB+TROPI)
CK, MB: 1.5 ng/mL (ref 0.3–4.0)
Relative Index: INVALID (ref 0.0–2.5)
Troponin I: <0.3 ng/mL (ref <0.30)

## 2011-03-25 LAB — CBC
MCH: 30 pg (ref 26.0–34.0)
MCV: 86.3 fL (ref 78.0–100.0)
Platelets: 205 10*3/uL (ref 150–400)
RBC: 4.9 MIL/uL (ref 4.22–5.81)
RDW: 13 % (ref 11.5–15.5)
WBC: 9.7 10*3/uL (ref 4.0–10.5)

## 2011-03-25 LAB — BASIC METABOLIC PANEL
BUN: 10 mg/dL (ref 6–23)
CO2: 27 mEq/L (ref 19–32)
Calcium: 9.7 mg/dL (ref 8.4–10.5)
Chloride: 99 mEq/L (ref 96–112)
Creatinine, Ser: 0.94 mg/dL (ref 0.50–1.35)
Glucose, Bld: 121 mg/dL — ABNORMAL HIGH (ref 70–99)

## 2011-03-25 LAB — DIFFERENTIAL
Basophils Relative: 0 % (ref 0–1)
Eosinophils Absolute: 0.1 10*3/uL (ref 0.0–0.7)
Eosinophils Relative: 1 % (ref 0–5)
Lymphs Abs: 3.4 10*3/uL (ref 0.7–4.0)
Monocytes Relative: 7 % (ref 3–12)
Neutrophils Relative %: 57 % (ref 43–77)

## 2011-03-26 ENCOUNTER — Encounter: Payer: Self-pay | Admitting: Cardiovascular Disease

## 2011-03-26 DIAGNOSIS — R079 Chest pain, unspecified: Secondary | ICD-10-CM

## 2011-03-26 LAB — CARDIAC PANEL(CRET KIN+CKTOT+MB+TROPI)
Relative Index: INVALID (ref 0.0–2.5)
Relative Index: INVALID (ref 0.0–2.5)
Total CK: 64 U/L (ref 7–232)
Troponin I: <0.3 ng/mL (ref <0.30)

## 2011-03-26 LAB — PROTIME-INR
INR: 2.51 — ABNORMAL HIGH (ref 0.00–1.49)
Prothrombin Time: 27.5 seconds — ABNORMAL HIGH (ref 11.6–15.2)

## 2011-03-27 LAB — PROTIME-INR
INR: 1.34 (ref 0.00–1.49)
Prothrombin Time: 16.8 seconds — ABNORMAL HIGH (ref 11.6–15.2)

## 2011-03-28 ENCOUNTER — Encounter: Payer: BC Managed Care – PPO | Admitting: *Deleted

## 2011-03-28 LAB — CBC
MCHC: 34.7 g/dL (ref 30.0–36.0)
RDW: 13.1 % (ref 11.5–15.5)

## 2011-03-28 LAB — BASIC METABOLIC PANEL
BUN: 12 mg/dL (ref 6–23)
GFR calc Af Amer: >60 mL/min (ref >60)
GFR calc non Af Amer: >60 mL/min (ref >60)
Potassium: 3.5 mEq/L (ref 3.5–5.1)
Sodium: 140 mEq/L (ref 135–145)

## 2011-03-28 LAB — PROTIME-INR
INR: 1.27 (ref 0.00–1.49)
Prothrombin Time: 16.2 seconds — ABNORMAL HIGH (ref 11.6–15.2)

## 2011-03-29 NOTE — H&P (Signed)
NAME:  Eric Fisher, BEAGLE NO.:  [id number]  MEDICAL RECORD NO.:  [id number]           PATIENT TYPE:  I  LOCATION:  2920                         FACILITY:  [facility]  PHYSICIAN:  [name], M.D. DATE OF BIRTH:  16-Feb-1966  DATE OF ADMISSION:  03/11/2011 DATE OF DISCHARGE:                             HISTORY & PHYSICAL   REASON FOR ADMISSION:  Mr. Riera is a very pleasant 45 year old male, with no prior history of heart disease, who initially presented to [facility] ED with complaint of severe left elbow and anterior chest discomfort.  He states that he was awakened at approximately 11:00 p.m. last night with severe left elbow pain, which was new, and subsequently radiated to the left upper chest region.  He had some mild associated nausea, but no vomiting, and no diaphoresis or dyspnea.  He took two baby aspirin, but continued to have discomfort, and was taken directly to [facility] ED.  He was treated with an additional two baby aspirin tablets, as well as sublingual nitroglycerin, Nitropaste, morphine, and intravenous heparin.  The initial electrocardiogram indicated sinus tachycardia at 104 bpm, with normal axis and slight J-point elevation of as much as 1 mm in leads III, and slightly less in aVF.  There were no previous studies for comparison.  A followup EKG approximately 2 hours later showed some slight improvement in the ST elevation in leads III and aVF, with no other significant changes.  The patient presents with no antecedent history of exertional angina pectoris or dyspnea.  He reportedly underwent a cardiac evaluation in 2009, involving an exercise stress test and echocardiography, for evaluation of chest discomfort referred to as "tightness."  The patient also has reflux disease, currently quiescent, and states that his presenting symptoms today are altogether different.  Serial cardiac markers were drawn and notable for initial total  CPK of 241/14, with a corresponding troponin I of 1.5.  A followup set indicates total CPK 380/32, with a corresponding troponin of 3.4.  The patient is currently hemodynamically stable and pain free.  ALLERGIES:  No known drug allergies.  HOME MEDICATIONS:  Dexilant DR 60 mg daily.  PAST MEDICAL HISTORY:  GERD.  SURGICAL HISTORY: 1. Arthroscopic left knee. 2. Hemorrhoidectomy.  SOCIAL HISTORY:  The patient lives in [place] with his parents.  He works in Production designer, theatre/television/film.  He is not married and has no children.  He has never smoked tobacco and drinks alcohol only on rare occasion.  FAMILY HISTORY:  Father age 48, atrial fibrillation.  REVIEW OF SYSTEMS:  Denies history of hypertension, diabetes mellitus, or dyslipidemia.  Denies any exertional chest pain or dyspnea.  Denies any evidence of overt bleeding.  Denies any symptoms of active reflux disease.  Remaining systems reviewed, and are negative.  PHYSICAL EXAMINATION:  VITAL SIGNS:  Blood pressure currently 129/83, pulse 103 and regular, respirations 14, temperature 98.2, sats 97% on 2 L. GENERAL:  A 45 year old male, well nourished, well developed, lying supine, in no apparent distress. HEENT:  Normocephalic, atraumatic.  PERRLA.  EOMI. NECK:  Palpable bilateral carotid pulse without bruits; no JVD at 30 degrees. LUNGS:  Clear to auscultation bilaterally. HEART:  Regular rate and rhythm.  No significant murmurs.  No rubs or gallops. ABDOMEN:  Soft, nontender with intact bowel sounds. EXTREMITIES:  Palpable bilateral femoral pulses without bruits; intact distal pulses without edema. SKIN:  Warm, dry. MUSCULOSKELETAL:  No obvious deformity. NEUROLOGIC:  No focal deficit.  Admission chest x-ray:  No acute changes. Admission EKG:  Sinus tachycardia, 104 bpm; normal axis; approximate 1- mm ST-segment elevation in lead III and less than 1 mm in aVF, with no other acute changes.  LABORATORY DATA:  CPK at 241/14 and 380/32;  troponin I 1.5 and 3.4.  WBC 7.9, hemoglobin 13, hematocrit 38, and platelet 175.  Sodium 137, potassium 3.5, BUN 15, creatinine 0.7, and glucose 130.  IMPRESSION: 1. Acute coronary syndrome.     a.     Probable evolving inferior myocardial infarction. 2. Gastroesophageal reflux disease. 3. Hyperglycemia.  PLAN:  Recommendation is to proceed with coronary angiography with probable percutaneous intervention.  The patient is currently hemodynamically stable and pain free.  However, the initial electrocardiogram is concerning for evolving inferior MI, with some persistent ST-segment changes on the repeat studies.  Moreover, serial cardiac markers were abnormal with upward trending troponins of 1.5 and 3.4.  Following consultation between Dr. Elease Hashimoto and Dr. Riley Kill, recommendation is to proceed directly to the cardiac catheterization lab for emergent angiography and possible percutaneous intervention.  The patient is agreeable with this recommendation to proceed, and the risks/benefits were discussed, in conjunction with Dr. Elease Hashimoto.     Gene Serpe, PA-C   ______________________________ [name], M.D.    GS/MEDQ  D:  03/11/2011  T:  03/11/2011  Job:  [id number]  Electronically Signed by Rozell Searing PA-C on 03/16/2011 06:13:26 PM Electronically Signed by Kristeen Miss M.D. on 03/29/2011 09:15:46 AM

## 2011-03-30 ENCOUNTER — Ambulatory Visit: Payer: BC Managed Care – PPO | Admitting: Cardiovascular Disease

## 2011-04-03 NOTE — H&P (Signed)
NAMEMarland Fisher  MANA, MORISON NO.:  [id number]  MEDICAL RECORD NO.:  [id number]  LOCATION:  2025                         FACILITY:  [facility]  PHYSICIAN:  Armanda Magic, M.D.     DATE OF BIRTH:  1966-04-02  DATE OF ADMISSION:  03/25/2011 DATE OF DISCHARGE:                             HISTORY & PHYSICAL   PRIMARY CARDIOLOGIST:  [name], MD  REFERRING PHYSICIAN:  [name].  CHIEF COMPLAINT:  Chest pain and left arm pain.  HISTORY OF PRESENT ILLNESS:  This is a 45 year old white male with a history of recent non-ST-elevation MI from a proximal LAD aneurysm with organized thrombus who presented today with complaints of sharp pain in his left elbow similar to what he had have with his prior MI, although his prior MI, the arm pain was more significant and extended further down his arm.  He took one sublingual nitroglycerin and upon reaching for his nitroglycerin bottle his left arm became numb.  He took a nitroglycerin which resolved his pain.  He was pain free by the time he arrived in the ER.  He said he had one more episode of pain when he arrived at [facility], but has none now.  He said the pain is similar in quality to his left arm pain with his MI, but the numbness is new.  He denies any shortness of breath, nausea, vomiting, or diaphoresis.  PAST MEDICAL HISTORY: 1. GERD. 2. 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  Cath on March 11, 2011,     showed a large coronary aneurysm in the proximal LAD with     compromise of the septal perforator which was subtotaled and second     diagonal 70% narrowed.  He was placed on anticoagulation and repeat     cath on the 7th showed a large residual filling defect in the LAD     and 30-40% distal RCA stenosis.  He was placed on Coumadin as well     as Brilinta and aspirin and discharged to home with plans for     followup cath in  about 3 months.  SOCIAL HISTORY:  He lives in [place] with his parents.  He works in Production designer, theatre/television/film.  He denies any tobacco use.  He occasionally drinks alcohol on a rare basis.  FAMILY HISTORY:  His father has AFib and is alive and well.  His mother is alive and well.  ALLERGIES:  None.  MEDICATIONS: 1. Aspirin 81 mg daily. 2. Coumadin. 3. Lipitor 80 mg daily. 4. Metoprolol tartrate 25 mg b.i.d. 5. Protonix 40 mg 2 tablets daily. 6. Brilinta 90 mg q.12 h. 7. Multivitamin. 8. Vitamin B daily. 9. Vitamin C daily. 10.Zicam 2 sprays p.r.n.  PAST SURGICAL HISTORY:  Left knee arthroscopy and hemorrhoidectomy.  REVIEW OF SYSTEMS:  Otherwise as stated in HPI is negative.  PHYSICAL EXAMINATION:  VITAL SIGNS:  Blood pressure is 127/80.  Heart rate 88.  He is afebrile.  O2 saturation is 95% on room air. GENERAL:  This is a well-developed, well-nourished white male in no acute distress. HEENT:  Benign. NECK:  Supple without lymphadenopathy.  No bruits. LUNGS:  Clear to auscultation throughout. HEART: Regular rate and rhythm with no murmurs, rubs, or gallops. Normal S1 and S2. ABDOMEN:  Soft, nontender, and nondistended.  Normoactive bowel sounds. No hepatosplenomegaly. EXTREMITIES:  No cyanosis, erythema, or edema.  +2 dorsalis pedis pulses bilaterally.  LABORATORY DATA:  Sodium 137, potassium 3.6, chloride 99, bicarb 27, BUN 10, and creatinine 0.94.  White cell count 9.7, hemoglobin 14.7, hematocrit 42.3, and platelet count 205.  Troponin less than 0.3, CPK 81, and MB 2.1.  INR 3.01.  Chest x-ray shows no active disease.  ASSESSMENT: 1. Left arm pain and numbness similar to prior sensation with his     prior non-ST-elevation myocardial infarction, although not as     severe.  Initial cardiac enzymes are negative x1.  His symptoms     have now resolved.  His EKG in the emergency room demonstrated     sinus rhythm at 92 beats per minute with no ST changes. 2. Coronary artery  disease with recent non-ST-elevation myocardial     infarction with proximal left anterior descending aneurysm and     organized thrombus, 70% second diagonal branch, and subtotaled     septal perforator, now Coumadin, aspirin, and Brilinta.  Plan was     for repeat cath in 3 months. 3. Gastroesophageal reflux disease.  PLAN:  Admit to telemetry bed.  Cycle cardiac enzymes.  We will hold Coumadin for now in case his [place] cardiologist wants to pursue cardiac catheterization.  We will continue Brilinta and aspirin. Questionable need for repeat cath earlier, but we will leave decision up to Dr. Elease Hashimoto.     Armanda Magic, M.D.     TT/MEDQ  D:  03/25/2011  T:  03/26/2011  Job:  [id number]  Electronically Signed by Armanda Magic M.D. on 04/03/2011 10:03:04 PM

## 2011-04-05 ENCOUNTER — Encounter: Payer: Self-pay | Admitting: Cardiovascular Disease

## 2011-04-05 ENCOUNTER — Ambulatory Visit (INDEPENDENT_AMBULATORY_CARE_PROVIDER_SITE_OTHER): Payer: BC Managed Care – PPO | Admitting: Nurse Practitioner

## 2011-04-05 ENCOUNTER — Encounter: Payer: Self-pay | Admitting: Nurse Practitioner

## 2011-04-05 ENCOUNTER — Ambulatory Visit (INDEPENDENT_AMBULATORY_CARE_PROVIDER_SITE_OTHER): Payer: BC Managed Care – PPO | Admitting: *Deleted

## 2011-04-05 DIAGNOSIS — Z7901 Long term (current) use of anticoagulants: Secondary | ICD-10-CM | POA: Insufficient documentation

## 2011-04-05 DIAGNOSIS — K219 Gastro-esophageal reflux disease without esophagitis: Secondary | ICD-10-CM

## 2011-04-05 DIAGNOSIS — I2510 Atherosclerotic heart disease of native coronary artery without angina pectoris: Secondary | ICD-10-CM | POA: Insufficient documentation

## 2011-04-05 DIAGNOSIS — R072 Precordial pain: Secondary | ICD-10-CM

## 2011-04-05 DIAGNOSIS — I749 Embolism and thrombosis of unspecified artery: Secondary | ICD-10-CM

## 2011-04-05 DIAGNOSIS — R079 Chest pain, unspecified: Secondary | ICD-10-CM

## 2011-04-05 LAB — CBC WITH DIFFERENTIAL/PLATELET
Basophils Absolute: 0 10*3/uL (ref 0.0–0.1)
Eosinophils Relative: 0.4 % (ref 0.0–5.0)
MCV: 88.9 fl (ref 78.0–100.0)
Monocytes Absolute: 0.5 10*3/uL (ref 0.1–1.0)
Neutrophils Relative %: 79.7 % — ABNORMAL HIGH (ref 43.0–77.0)
Platelets: 193 10*3/uL (ref 150.0–400.0)
RDW: 13.3 % (ref 11.5–14.6)
WBC: 9.7 10*3/uL (ref 4.5–10.5)

## 2011-04-05 LAB — TROPONIN I: Troponin I: <0.3 ng/mL (ref <0.30)

## 2011-04-05 LAB — CREATININE KINASE MB: CK-MB: 1.2 ng/mL (ref 0.3–4.0)

## 2011-04-05 MED ORDER — DEXLANSOPRAZOLE 60 MG PO CPDR: 60.0000 mg | DELAYED_RELEASE_CAPSULE | Freq: Every day | ORAL | Status: AC

## 2011-04-05 NOTE — Assessment & Plan Note (Signed)
Eric Fisher  presents today for followup of his coronary disease. He has a history of coronary thrombosis. He originally presented with chest pain and abnormal EKG G. And an abnormal cardiac enzymes. Cardiac catheterization revealed a large thrombus in his left and her descending artery. He was treated very aggressively with heparin and will into in the thrombus cleared up quite nicely. He's had 3 cardiac catheterizations so far.  He presents today with some atypical episodes of chest pain. These symptoms feel more like esophageal reflux and do not necessarily feel like his previous episodes of angina pain.  His EKG is unremarkable.  We will get a CPK MB level and troponin level. I changed his Prevacid to Dexilant which he thinks works better. I'll see him again in 3 months for followup visit. I'll see him sooner if needed.

## 2011-04-05 NOTE — Assessment & Plan Note (Signed)
John will stay on Coumadin for now. His INR is 4.0 his and we will adjust his Coumadin dose down. He is also on Brilinta and aspirin. I anticipate stopping the Brilinta after about 6 months. Given the large size of his dilated coronary arteries, I think that Coumadin therapy plus low-dose aspirin will be our best way to prevent further thrombus formation in his coronary arteries. We'll continue to draw his INRs on a regular basis.

## 2011-04-05 NOTE — Progress Notes (Signed)
Eric Fisher Date of Birth  09-12-1966 [name] [address]     [address] [place], [place]  [address] [phone]  Fax  [phone]  History of Present Illness:  Pt has a history of CAD - dilated LAD with an associated thrombus.  Treated with coumadin, brilinta, and ASA.  Re-cath showed thrombus to be completely resolved.  Was at work yesterday and had substernal cp.  Had barbeque for dinner the night before. Still has some chest pressure today.  No energy. No appetite. No dyspnea.  No syncope. No bleeding in stool or urine.   Current Outpatient Prescriptions on File Prior to Visit  Medication Sig Dispense Refill  . aspirin 81 MG EC tablet Take 81 mg by mouth daily.        Marland Kitchen atorvastatin (LIPITOR) 80 MG tablet Take 80 mg by mouth daily.        . metoprolol tartrate (LOPRESSOR) 25 MG tablet Take 25 mg by mouth 2 (two) times daily.        . nitroGLYCERIN (NITROSTAT) 0.4 MG SL tablet Place 0.4 mg under the tongue every 5 (five) minutes as needed.        . pantoprazole (PROTONIX) 40 MG tablet Take 80 mg by mouth daily.        . Ticagrelor (BRILINTA) 90 MG TABS tablet Take 180 mg by mouth daily.        Marland Kitchen warfarin (COUMADIN) 5 MG tablet Take 5 mg by mouth as directed.        Marland Kitchen DISCONTD: Ascorbic Acid (VITAMIN C PO) Take by mouth daily.        Marland Kitchen DISCONTD: enoxaparin (LOVENOX) 150 MG/ML injection Inject 90 mg into the skin every 12 (twelve) hours.        Marland Kitchen DISCONTD: Multiple Vitamin (MULTIVITAMIN) tablet Take 1 tablet by mouth daily.        Marland Kitchen DISCONTD: Thiamine HCl (VITAMIN B-1 PO) Take by mouth daily.          No Known Allergies  Past Medical History  Diagnosis Date  . GERD (gastroesophageal reflux disease)   . MI, acute, non ST segment elevation   . Hyperglycemia     Past Surgical History  Procedure Date  . Knee arthroscopy   . Hemorroidectomy   . Cardiac catheterization 03/11/2011    This demonstrated marked ectasia of the  proximal LAD with suggestion of a large filling defect consistent  with thrombus.  There was clot extending into the first septal   perforating branch which had slow flow.   . Transthoracic echocardiogram 03/13/2011    Left ventricle: Mild distal septal hypokinesis The cavity size was normal. Systolic function was normal. The estimated ejection fraction was 55%. Wall motion was normal    History  Smoking status  . Never Smoker   Smokeless tobacco  . Not on file    History  Alcohol Use  . Yes    Family History  Problem Relation Age of Onset  . Arrhythmia Father     Reviw of Systems:  Reviewed in the HPI.  All other systems are negative.  Physical Exam: BP 110/80  Pulse 88  Wt 184 lb (83.462 kg) The patient is alert and oriented x 3.  The mood and affect are normal.   Skin: warm and dry.  Color is normal.    HEENT:   the sclera are nonicteric.  The mucous membranes are moist.  The carotids are 2+  without bruits.  There is no thyromegaly.  There is no JVD.    Lungs: clear.  The chest wall is non tender.    Heart: regular rate with a normal S1 and S2.  There are no murmurs, gallops, or rubs. The PMI is not displaced.     Abdomin: good bowel sounds.  There is no guarding or rebound.  There is no hepatosplenomegaly or tenderness.  There are no masses.   Extremities:  no clubbing, cyanosis, or edema.  The legs are without rashes.  The distal pulses are intact.   Neuro:  Cranial nerves II - XII are intact.  Motor and sensory functions are intact.    The gait is normal.  ECG:  Assessment / Plan:

## 2011-04-05 NOTE — Patient Instructions (Signed)
Start Dexilant instead of Prevacid.  Return to see your GI doctor for evaluation of your reflux. Return to see me in 3 months.

## 2011-04-09 ENCOUNTER — Telehealth: Payer: Self-pay | Admitting: Cardiology

## 2011-04-09 NOTE — Telephone Encounter (Signed)
Physician would like to speak to doc on call for dr nahser.

## 2011-04-09 NOTE — Telephone Encounter (Signed)
I talked with Dr. Eric Form who called about this patient.  Patient continues to be very anxious about ongoing chest pain.  He apparently has had several recent cardiac caths by Dr. Elease Hashimoto.  Dr. Clelia Croft is going to give him someXanax to see if this will help and the patient will continue his other medications including sublingual nitroglycerin p.r.n.  Patient will call if he is not improved.

## 2011-04-16 ENCOUNTER — Other Ambulatory Visit: Payer: Self-pay | Admitting: Cardiology

## 2011-04-16 ENCOUNTER — Encounter (INDEPENDENT_AMBULATORY_CARE_PROVIDER_SITE_OTHER): Payer: BC Managed Care – PPO | Admitting: *Deleted

## 2011-04-18 ENCOUNTER — Other Ambulatory Visit: Payer: Self-pay | Admitting: *Deleted

## 2011-04-18 MED ORDER — METOPROLOL TARTRATE 25 MG PO TABS: 25.0000 mg | ORAL_TABLET | Freq: Two times a day (BID) | ORAL | Status: DC

## 2011-04-18 NOTE — Telephone Encounter (Signed)
Completed jodette rn

## 2011-04-18 NOTE — Telephone Encounter (Signed)
Pharmacy called to change dr name to dr Elease Hashimoto, dr Shirlee Latch has not seen pt.

## 2011-04-19 ENCOUNTER — Encounter (HOSPITAL_COMMUNITY): Payer: Self-pay

## 2011-04-19 ENCOUNTER — Ambulatory Visit (INDEPENDENT_AMBULATORY_CARE_PROVIDER_SITE_OTHER): Payer: BC Managed Care – PPO | Admitting: *Deleted

## 2011-04-19 ENCOUNTER — Encounter (HOSPITAL_COMMUNITY)
Admission: RE | Admit: 2011-04-19 | Discharge: 2011-04-19 | Disposition: A | Discharge status: Home / Self Care | Payer: BC Managed Care – PPO | Source: Ambulatory Visit | Attending: Cardiovascular Disease | Admitting: Cardiovascular Disease

## 2011-04-19 DIAGNOSIS — Z5189 Encounter for other specified aftercare: Secondary | ICD-10-CM | POA: Insufficient documentation

## 2011-04-19 DIAGNOSIS — I252 Old myocardial infarction: Secondary | ICD-10-CM | POA: Insufficient documentation

## 2011-04-19 DIAGNOSIS — I749 Embolism and thrombosis of unspecified artery: Secondary | ICD-10-CM

## 2011-04-19 DIAGNOSIS — I2510 Atherosclerotic heart disease of native coronary artery without angina pectoris: Secondary | ICD-10-CM | POA: Insufficient documentation

## 2011-04-19 HISTORY — DX: Atherosclerotic heart disease of native coronary artery without angina pectoris: I25.10

## 2011-04-19 LAB — POCT INR: INR: 1.6

## 2011-04-19 NOTE — Patient Instructions (Signed)
During orientation advised patient on arrival and appointment times what to wear, what to do before, during and after exercise. Reviewed attendance and class policy. Talked about inclement weather and class consultation policy. Pt is going to be coming to the early class because he will going to work after class is done.

## 2011-04-19 NOTE — Progress Notes (Signed)
Orientation completed. Patient is scheduled to start on Monday 04/23/11 at 6:45. Pt is registered and records have been requested. Pt is eager to get started. Tyronda Vizcarrondo [employer]

## 2011-04-23 ENCOUNTER — Encounter (HOSPITAL_COMMUNITY)
Admission: RE | Admit: 2011-04-23 | Discharge: 2011-04-23 | Disposition: A | Discharge status: Home / Self Care | Payer: BC Managed Care – PPO | Source: Ambulatory Visit | Attending: Cardiovascular Disease | Admitting: Cardiovascular Disease

## 2011-04-25 ENCOUNTER — Encounter (HOSPITAL_COMMUNITY)
Admission: RE | Admit: 2011-04-25 | Discharge: 2011-04-25 | Disposition: A | Discharge status: Home / Self Care | Payer: BC Managed Care – PPO | Source: Ambulatory Visit | Attending: Cardiovascular Disease | Admitting: Cardiovascular Disease

## 2011-04-26 NOTE — Discharge Summary (Signed)
NAMEMarland Fisher  [name], Eric NO.:  [id number]  MEDICAL RECORD NO.:  [id number]  LOCATION:  3714                         FACILITY:  [facility]  PHYSICIAN:  Eric Fisher, M.D. DATE OF BIRTH:  1966-08-08  DATE OF ADMISSION:  03/11/2011 DATE OF DISCHARGE:  03/16/2011                              DISCHARGE SUMMARY   PRIMARY CARDIOLOGIST:  Eric Mixer, MD  DISCHARGE DIAGNOSES: 1. Non-ST-elevation myocardial infarction secondary to left anterior     descending coronary artery thrombus. 2. Preserved left ventricular function.  SECONDARY DIAGNOSES: 1. Gastroesophageal reflux disease. 2. Hyperglycemia.  PROCEDURES/DIAGNOSTICS PERFORMED DURING HOSPITALIZATION: 1. Left heart catheterization with selective coronary arteriography     and selective left ventriculography on March 11, 2011.     a.     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  It was noted      there was compromise of the septal perforator as well as the      diagonal branch at its ostium.     b.     Preserved left ventricular systolic function without a      definite wall motion abnormality. 2. Selective coronary angiography on March 15, 2011.     a.     Proximal LAD with marked ectasia and large residual filling      defects.  TIMI 3 flow in the LAD, diagonal, and first SP. 3. Echocardiogram on March 13, 2011:  Left ventricular systolic function     was normal, EF 55%.  No regional wall motion abnormalities.  Left     ventricle with mild distal septal hypokinesis. 4. Chest x-ray on March 11, 2011:  No acute cardiopulmonary disease.  REASON FOR HOSPITALIZATION:  This is a 45 year old gentleman without prior history of heart disease who presented to the [facility] Emergency Department with complaints of severe left elbow and anterior chest discomfort.  He was awakened from his sleep with his pain.  The pain was not relieved after taking aspirin, therefore he presented to the emergency department.  Initial EKG  demonstrated sinus tachycardia at a rate of 104 beats per minute with slight J-point elevation approximately 1-mm in leads III as well as aVF.  The patient was treated with sublingual nitroglycerin, nitro paste, aspirin, morphine, and intravenous heparin.  Followup EKG showed slight improvement in ST elevation.  Initial troponin was noted to be 1.5 with followup troponin of 3.4.  The patient was transferred to [facility] for cardiac catheterization and preserved management.  HOSPITAL COURSE:  The patient was brought urgently to the cardiac cath lab by Dr. Riley Kill, informed consent was obtained.  Dr. Riley Kill, noted the left anterior descending artery had a large area of aneurysmal dilation proximally.  There was suggestion of a thrombus organized within the aneurysm.  Noted that the septal perforator was also subtotally occluded proximally and this was felt to be the source of the enzyme elevation.  Second diagonal also had a 70% narrowing.  The thrombus was noted between 10-15 mm in size can be seen in the proximal LAD.  Besides the above, there is no further suggestion of atherosclerotic disease.  The patient also demonstrated well-preserved left ventricular function without wall motion  abnormalities.  After discussion with Dr. Elease Hashimoto, it was felt patient should be started on antiplatelet therapy as well as intravenous heparin.  The patient tolerated the procedure and was taken for observation to CCU.  Dr. Barry Dienes was asked to evaluate the films for questionable cardiothoracic surgical intervention.  After review, Dr. Barry Dienes, felt that at this time surgery would not play a role in the patient's care. He recommended relook catheterization.  His consultation was greatly appreciated.  An echocardiogram was obtained at this time.  This showed left ventricle with mild distal septal hypokinesis.  There was preserved ejection fraction.  At this time, the patient was felt stable and was transferred to  the telemetry unit on March 12, 2011.  He was continued on heparin as well as Brilinta while waiting for relook catheterization. The patient had no further complaints of chest pain.  On March 15, 2011, Dr. Excell Seltzer, brought the patient back to the cath lab for selective coronary angiography.  Again, risks, benefits, indications were discussed with the patient and he voiced understanding.  Informed consent was obtained.  It was noted that the patient had proximal LAD with marked ectasia enlarged residual filling defect.  This was suggestive of an organized thrombus.  It was noted the patient had TIMI 3 flow in all vessels and the area has not worsened.  Although the Promus did remain persistent despite being on heparin, aspirin, and Brilinta.  After reviewing the films with Dr. Riley Kill and Dr. Melburn Popper, and it was felt the patient should be resumed on heparin and started on Coumadin.  The patient has since been transitioned to Lovenox and this will be great until the patient's INR is between 2-3.  Of note, Dr. Excell Seltzer, recommended relook catheterization in 2-3 months.  The patient tolerated the procedure well and was taken for overnight observation on the telemetry unit.  His right radial site was without signs of hematoma.  The patient was ambulating without chest pain or shortness of breath.  On day of discharge, Dr. Elease Hashimoto evaluated the patient and noted him stable for home with close outpatient followup. 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 Elease Hashimoto.  DISCHARGE LABORATORY DATA:  INR 1.07.  CBC 6.4, hemoglobin 14.3, hematocrit 41.1.  DISCHARGE MEDICATIONS: 1. Aspirin 81 mg enteric-coated 1 tablet daily. 2. Coumadin 5 mg 1 to 1-1/2 tablet daily. 3. Lovenox 90 mg subcutaneously every 12 hours. 4. Lipitor 80 mg 1 tablet daily. 5. Metoprolol tartrate 25 mg 1 tablet twice daily. 6. Nitroglycerin 0.4 mg tablets sublingual 1  tablet under the tongue     q.5 minutes as needed for chest pain. 7. Protonix 40 mg 2 tablets daily. 8. Brilinta 90 mg 1 tablet every 12 hours. 9. Multivitamin daily. 10.Vitamin B daily. 11.Vitamin C daily. 12.Zicam 2 sprays nasally as needed. 13.Dexilant - please stop taking.  FOLLOWUP PLANS AND INSTRUCTIONS: 1. The patient will follow up in the Coumadin Clinic on March 20, 2011,     at 4:15.  He is to continue Lovenox until this time. 2. The patient is to follow up with Dr. Elease Hashimoto on March 30, 2011, at     3:45. 3. The patient is to return to work after further discussion with Dr.     Melburn Popper, his followup appointment on March 30, 2011. 4. The patient to increase activity slowly.  The patient may shower,     but no bathing. 5. The patient  is not to lift anything for 2 weeks greater than 5     pounds. 6. No driving for 2 days. 7. No sexual activity for 1 week.  The patient is to keep his cath     site clean and dry and to call office for any problems.  The     patient is to continue low-sodium, heart-healthy diet. 8. The patient to avoid straining and stop any activity that causes     chest pain or shortness of breath.  Duration discharge greater 30 minutes with physician and physician extender time.     Leonette Monarch, PA-C   ______________________________ Eric Fisher, M.D.    NB/MEDQ  D:  03/16/2011  T:  03/17/2011  Job:  [id number]  Electronically Signed by Alen Blew P.A. on 04/01/2011 04:56:12 PM Electronically Signed by Kristeen Miss M.D. on 04/26/2011 02:43:55 PM

## 2011-04-26 NOTE — Discharge Summary (Signed)
NAMEMarland Kitchen  Eric Fisher, Eric Fisher NO.:  [id number]  MEDICAL RECORD NO.:  [id number]  LOCATION:  2025                         FACILITY:  [facility]  PHYSICIAN:  [name], M.D. DATE OF BIRTH:  1966/01/19  DATE OF ADMISSION:  03/25/2011 DATE OF DISCHARGE:  03/28/2011                              DISCHARGE SUMMARY   PRIMARY CARDIOLOGIST:  [name], MD  PRIMARY CARE PROVIDER:  Kari Baars, MD  DISCHARGE DIAGNOSIS:  Noncardiac left arm pain.  SECONDARY DIAGNOSES: 1. Coronary artery disease status post non-ST-elevation myocardial     infarction, March 11, 2011 showing significant left anterior     descending thrombus which was improved by catheterization this     admission. 2. Gastroesophageal reflux disease. 3. Hyperlipidemia.  ALLERGIES:  No known drug allergies.  PROCEDURES:  Coronary angiography performed March 27, 2011 showing persistent aneurysmal dilatation of the proximal LAD with marked improvement in the appearance of intraluminal thrombus.  Otherwise nonobstructive CAD.  HISTORY OF PRESENT ILLNESS:  A 45 year old male who was recently admitted to [facility] March 11, 2011 secondary to chest and left arm/elbow pain.  During that admission, the patient was ruled in for non ST  elevation MI with catheterization revealing aneurysmal dilatation of the proximal LAD with large area of thrombus.  The patient was maintained on aspirin, heparin, and Brilinta therapy with relook catheterization performed June 7 showing persistent LAD thrombus, but with TIMI 3 flow on all vessels.  Decision was made to continue aspirin and  Brilinta therapy as well as add Coumadin.  The patient was finally discharged home on June 8 and had been doing well in the outpatient setting.  However, on June 17, the patient developed recurrent sharp left elbow pain similar to prior MI symptoms associated with left arm numbness.  This resolved with sublingual nitroglycerin.  Presented  to [facility] for further evaluation and was admitted to [facility].  HOSPITAL COURSE:  The patient ruled out for MI.  He had no further episodes of left elbow pain or numbness.  As the patient has been on Coumadin, his admission INR was 3.01 and he was treated with vitamin K and plans for cardiac catheterization were made.  The patient underwent diagnostic cardiac catheterization on June 19 showing persistent proximal LAD aneurysmal dilatation, but with significant improvement in the appearance of the intraluminal thrombus which was not readily evident on the current study.  The patient otherwise had nonobstructive disease and no targets for intervention. Continued medical therapy was recommended.  The patient has been reinitiated on Coumadin therapy and will continue on aspirin and Brilinta.  The patient will be discharged home today in good condition.  DISCHARGE LABS:  Hemoglobin 13.2, hematocrit 37.5, WBC 7.0, platelets 195, INR 1.27.  Sodium 140, potassium 3.5, chloride 105, CO2 25, BUN 12, creatinine 0.80, glucose 89, CK 70, MB 1.6, troponin I less than 0.30.  DISPOSITION:  The patient will be discharged home today in good condition.  FOLLOWUP PLANS/APPOINTMENTS:  The patient will follow up with [facility] on June 28 where he will have his INR checked at 8:30 and then see Norma Fredrickson, nurse practitioner at 8:45 a.m.  DISCHARGE  MEDICATIONS: 1. Coumadin 5 mg alternating with 7.5 mg every other day. 2. Aspirin 81 mg daily. 3. Lipitor 80 mg at bedtime. 4. Metoprolol tartrate 25 mg b.i.d. 5. Nitroglycerin 0.4 mg subcu p.r.n. chest pain. 6. Protonix 40 mg daily. 7. Ticagrelor 90 mg q.12 h.  OUTSTANDING LABS AND STUDIES:  Followup INR June 28.  DURATION DISCHARGE ENCOUNTER:  40 minutes including physician time.     [name], ANP   ______________________________ [name], M.D.    CB/MEDQ  D:  03/28/2011  T:  03/28/2011   Job:  [id number]  cc:   Kari Baars, M.D.  Electronically Signed by [name] ANP on 03/30/2011 02:33:39 PM Electronically Signed by Kristeen Miss M.D. on 04/26/2011 02:43:59 PM

## 2011-04-26 NOTE — Cardiovascular Report (Signed)
NAMEMarland Kitchen  Eric Fisher, Eric Fisher NO.:  [id number]  MEDICAL RECORD NO.:  [id number]  LOCATION:  3714                         FACILITY:  [facility]  PHYSICIAN:  Veverly Fells. Excell Seltzer, MD  DATE OF BIRTH:  13-Feb-1966  DATE OF PROCEDURE: DATE OF DISCHARGE:                           CARDIAC CATHETERIZATION   PROCEDURE:  Selective coronary angiography.  PROCEDURAL INDICATIONS:  Mr. Eric Fisher is a 45 year old gentleman who presented with non-ST-elevation infarction.  He underwent cardiac catheterization on March 11, 2011.  This demonstrated marked ectasia of the proximal LAD with suggestion of a large filling defect consistent with thrombus.  There was clot extending into the first septal perforating branch which had slow flow.  The patient has been continued on heparin, Brilinta, and aspirin.  He was referred for a relook cardiac catheterization.  Risks and indication of the procedure were reviewed with the patient and informed consent was obtained.  The right wrist was prepped, draped and anesthetized with 1% lidocaine.  Using modified Seldinger technique, a 6- French sheath was placed in the right radial artery.  5000 units of unfractionated heparin was given.  Verapamil 3 mg was administered through the sheath.  An XB LAD 3.5-cm guide catheter was inserted for coronary angiography.  A guide catheter was chosen so that the vessel could be well visualized.  Standard views were obtained in multiple planes.  The patient tolerated the procedure well.  There were no immediate complications.  FINDINGS:  Aortic pressure 121/88 with a mean of 105.  The left mainstem has diffuse plaque.  There is no high-grade obstructive disease.  The left main is fairly normal in caliber.  The left main divides into the LAD and left circumflex.  Left circumflex.  The circumflex is fairly small in caliber.  There is a moderate caliber intermediate branch with no stenosis.  The AV groove circumflex is  small and supplies a single small OM.  LAD.  The LAD is very large in caliber.  There is marked ectasia in the proximal LAD with an eccentric large filling defect along the septal perforator side of the vessel.  The lumen of the LAD is well preserved, but there clearly is a well circumscribed filling defect in the proximal vessel.  The septal perforator has ostial narrowing with TIMI 3 flow. There is also haziness at the ostium of a large first diagonal branch. There is TIMI 3 flow throughout the entirety of the LAD.  The mid and distal LAD have minor luminal irregularities and returned to normal caliber.  The vessel throughout the proximal LAD at the location of the filling defect is probably 10 mm in diameter.  FINAL ASSESSMENT:  Large residual filling defect in the proximal left anterior descending suggestive of organized thrombus.  The patient has TIMI 3 flow in all vessels and the area has not worsened.  However, there is persistence of this despite aggressive anticoagulation with heparin, aspirin, and Brilinta.  The films were reviewed with both Dr. Riley Fisher and Dr. Elease Fisher.  We plan on resuming heparin and starting warfarin.  The patient will be transitioned to Lovenox so that he can be discharged home.  He will remain on triple therapy with low-dose aspirin,  Brilinta, and Coumadin. His INR will need to be managed closely.  We probably will repeat his coronary angiogram in 2-3 months.     Veverly Fells. Excell Seltzer, MD     MDC/MEDQ  D:  03/15/2011  T:  03/15/2011  Job:  [id number]  cc:   Eric Fisher, M.D. Eric Fisher. Eric Kill, MD, [facility] Eric Fisher. Eric Fisher, M.D.  Electronically Signed by Eric Bollman MD on 04/26/2011 12:13:11 AM

## 2011-04-26 NOTE — Cardiovascular Report (Signed)
  Eric Fisher, Eric Fisher NO.:  [id number]  MEDICAL RECORD NO.:  [id number]  LOCATION:  2025                         FACILITY:  [facility]  PHYSICIAN:  Arturo Morton. Riley Kill, MD, FACCDATE OF BIRTH:  [birth year]  DATE OF PROCEDURE:  03/27/2011 DATE OF DISCHARGE:                           CARDIAC CATHETERIZATION   INDICATIONS:  [name] is well-known to [place].  Approximately 2 weeks ago, he presented with chest pain.  Catheterization revealed a large aneurysmal dilatation in the proximal left anterior descending artery and what appeared to be thrombus.  He was treated aggressively with anticoagulation, brought back to the laboratory which demonstrated some continued thrombus.  He was continued on Coumadin, Ticagrelor, and aspirin.  He developed left arm numbness and was readmitted.  His Coumadin was withheld, and reversed with vitamin K by Dr. Elease Hashimoto.  He was set up for diagnostic cardiac catheterization.  PROCEDURES: 1. Placement of catheters without left heart catheterization. 2. Coronary arteriography. 3. Femoral closure.  DESCRIPTION OF PROCEDURE:  The procedure was formed for the right femoral artery with 4-French catheters.  With the potential for restarting Lovenox and Coumadin, we elected to close the femoral vessel. A 6-French Angio-Seal was then placed in the right femoral artery, and good hemostasis achieved.  There were no major complications.  We discussed the case with his family.  He was taken to the holding area in satisfactory clinical condition.  HEMODYNAMIC DATA:  Central aortic pressure 109/69, mean 90.  ANGIOGRAPHIC DATA: 1. The left main is free of critical disease. 2. There is aneurysmal dilatation of the proximal left anterior     descending artery compared to the previous study by myself and then     subsequently by Dr. Excell Seltzer, there is marked improvement in the     appearance of intraluminal thrombus.  It was not readily evident on     the  current study.  Flow down to the septal perforator was markedly     improved.  In addition, the diagonal looked compromised than on the     previous study.  There was good flow to the distal left anterior     descending artery. 3. The circumflex demonstrates a circumflex branch which is free of     critical disease. 4. The right coronary artery is unchanged from the prior studies with     about a 50% area of distal narrowing.  CONCLUSIONS:  Marked aneurysmal dilatation of proximal left anterior descending artery, well in excess of 8 mm.  Importantly, the presence of intraluminal thrombus is no longer seen.  I have reviewed the films with Dr. Elease Hashimoto.  He will be restarted back on his Coumadin at night. Consideration of anticoagulation for an additional 2 months will be undertaken and then he might be able to be followed by CTA.  I will have Dr. Elease Hashimoto speak with Dr. Eden Emms.     Arturo Morton. Riley Kill, MD, [facility]     TDS/MEDQ  D:  03/27/2011  T:  03/28/2011  Job:  [id number]  cc:   [name], M.D. CV Laboratory Veverly Fells. Excell Seltzer, MD  Electronically Signed by Shawnie Pons MD [facility] on 04/26/2011 07:21:25 AM

## 2011-04-26 NOTE — Cardiovascular Report (Signed)
NAMEMarland Kitchen  QUADRE, BRISTOL NO.:  [id number]  MEDICAL RECORD NO.:  [id number]           PATIENT TYPE:  I  LOCATION:  2920                         FACILITY:  [facility]  PHYSICIAN:  Arturo Morton. Riley Kill, MD, FACCDATE OF BIRTH:  10-11-1965  DATE OF PROCEDURE:  03/11/2011 DATE OF DISCHARGE:                           CARDIAC CATHETERIZATION   INDICATIONS:  Mr. Wiltgen is a 45 year old gentleman, who presented to [facility].  He has no prior cardiac history.  He is not a smoker. There is no family history of coronary artery disease at a premature age.  He presented with left arm pain, which he extended up into the left chest.  He underwent evaluation in the [facility] emergency room, which demonstrated some ST-segment change, initial cardiac markers were positive.  He was subsequently transferred, where he was seen by Dr. Elease Hashimoto.  His EKG had normalized, but his CK-MB was over 30, and it was felt that urgent catheterization would be best indicated.  Risks, benefits, and options were discussed with the patient.  He consented to proceed.  He was brought to the laboratory as an urgent catheterization on the weekend.  PROCEDURES: 1. Left heart catheterization. 2. Selective coronary arteriography. 3. Selective left ventriculography.  DESCRIPTION OF PROCEDURE:  The procedure was performed from the right radial artery.  An anterior puncture and 6-French sheath were placed, 3 mg of intraarterial verapamil and 4000 units of intravenous heparin were administered.  Views of the left and right coronary arteries were obtained in multiple angiographic projections.  We took a significant number of left coronary images to best lay out the LAD, shows evidence of coronary aneurysm, diagonal lesion, and a septal perforating narrowing.  The coronary aneurysmal segment appeared to be up to 10 mm or more in size, so additional views were obtained.  Following this, ventriculography was done,  particularly in the RAO and LAO projections to assess wall motion abnormalities which identify the source.  This was not forthcoming.  I had Dr. Kristeen Miss come to laboratory and I reviewed the studies with him.  I subsequently called Dr. Fredderick Phenix to help monitor his progress in the hospital.  Importantly, the patient had no pain here in the laboratory.  All catheters were subsequently removed and a TR band placed.  We spoke with his family.  A plan was put into place.  He was transferred back to the CCU in satisfactory clinical condition.  HEMODYNAMIC DATA: 1. Central aortic pressure is 111/82, mean 97. 2. LV pressure was 115/86 with a mean of 101. 3. There was no gradient or pullback across the aortic valve.  ANGIOGRAPHIC DATA: 1. The right coronary artery is a large-caliber vessel, being about 4     mm or more throughout.  Distally, there is about an area of 30%-40%     narrowing.  There is a modest amount of ectasia of the vessel     itself.  The PDA and posterolateral branches are without     significant compromise.2. The left main coronary artery is intact and appears to be of normal     size. 3. The left anterior  descending artery has a large area of aneurysmal     dilatation proximally.  In some views, particularly the RAO cranial     views, there is a suggestion of thrombus burden within the     aneurysm, although, it looks less impressive in some other views.     The first diagonal is widely patent.  There is a septal perforator     that is very small in caliber.  It is subtotally occluded     proximally and could be the source of enzyme elevation.  There is     also a second diagonal, it has an eccentric area of narrowing and     about 70%, although, the residual lumen was opened, and the     residual lumen is about 2 mm in size.  There is excellent runoff in     both the LAD and the diagonal branch.  The size of the aneurysm is     uncertain.  Nonetheless, in  multiple views.  It would appear to be     between 10 and 15 mm in size, encompassing the whole proximal LAD.     In the RAO cranial views, the LAD be narrows down after the first     diagonal with a lumen, it is about 5 mm in size, proximal to the     diagonal, and this is the area of potential concern with regard to     intraluminal thrombus. 4. The circumflex provides a smaller AV circumflex with fairly small     marginal system, and this does not appear to demonstrate     significant vascular compromise. 5. Ventriculography in both the RAO and LAO projections demonstrated     vigorous global systolic function without a definite segmental wall     motion abnormality.  CONCLUSION: 1. Well-preserved left ventricular systolic function without a     definite wall motion abnormality. 2. 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  There is compromise of the septal perforator     and there is some compromise of the diagonal branch at its ostium.     There is not a lot of other suggestion of atherosclerotic disease.     Whether these represents embolized thrombus from the LAD aneurysmal     territory is unclear.  DISPOSITION:  Dr. Elease Hashimoto and I have reviewed the films carefully.  We are going to start him on antiplatelet therapy as well as intravenous heparin.  I would suggest restudying him on Thursday of this week.  In the interim, we may opt to get a CAT scan to get a better idea of the coronary aneurysmal appearance and size.  The diagonal does not appear to be critical at the present time, and the proximal compromise in both the diagonal and septal perforator certainly could represent thrombus as well.  While consideration of intravascular ultrasound might be reasonable, I would be somewhat reluctant to pass an ultrasound catheter into the proximal LAD at the present time.  At the time of restudy, I would consider using a 7-French guide from  the femoral artery approach to get the best possible maximum opacification of the artery is possible.  We did use six diagnostic catheters with what appears to be fairly good opacification.  I will plan to review with my colleagues in detail.  We have asked Dr. Barry Dienes to see the patient as well.     Arturo Morton. Riley Kill,  MD, [facility]     TDS/MEDQ  D:  03/11/2011  T:  03/12/2011  Job:  [id number]  cc:   [name], M.D. CV Laboratory  Electronically Signed by Shawnie Pons MD [facility] on 04/26/2011 07:21:09 AM

## 2011-04-27 ENCOUNTER — Encounter (HOSPITAL_COMMUNITY)
Admission: RE | Admit: 2011-04-27 | Discharge: 2011-04-27 | Disposition: A | Discharge status: Home / Self Care | Payer: BC Managed Care – PPO | Source: Ambulatory Visit | Attending: Cardiovascular Disease | Admitting: Cardiovascular Disease

## 2011-04-30 ENCOUNTER — Encounter (HOSPITAL_COMMUNITY)
Admission: RE | Admit: 2011-04-30 | Discharge: 2011-04-30 | Disposition: A | Discharge status: Home / Self Care | Payer: BC Managed Care – PPO | Source: Ambulatory Visit | Attending: Cardiovascular Disease | Admitting: Cardiovascular Disease

## 2011-05-01 ENCOUNTER — Ambulatory Visit (INDEPENDENT_AMBULATORY_CARE_PROVIDER_SITE_OTHER): Payer: BC Managed Care – PPO | Admitting: *Deleted

## 2011-05-01 DIAGNOSIS — I749 Embolism and thrombosis of unspecified artery: Secondary | ICD-10-CM

## 2011-05-02 ENCOUNTER — Encounter (HOSPITAL_COMMUNITY)
Admission: RE | Admit: 2011-05-02 | Discharge: 2011-05-02 | Disposition: A | Discharge status: Home / Self Care | Payer: BC Managed Care – PPO | Source: Ambulatory Visit | Attending: Cardiovascular Disease | Admitting: Cardiovascular Disease

## 2011-05-04 ENCOUNTER — Encounter (HOSPITAL_COMMUNITY)
Admission: RE | Admit: 2011-05-04 | Discharge: 2011-05-04 | Disposition: A | Discharge status: Home / Self Care | Payer: BC Managed Care – PPO | Source: Ambulatory Visit | Attending: Cardiovascular Disease | Admitting: Cardiovascular Disease

## 2011-05-07 ENCOUNTER — Encounter (HOSPITAL_COMMUNITY)
Admission: RE | Admit: 2011-05-07 | Discharge: 2011-05-07 | Disposition: A | Discharge status: Home / Self Care | Payer: BC Managed Care – PPO | Source: Ambulatory Visit | Attending: Cardiovascular Disease | Admitting: Cardiovascular Disease

## 2011-05-07 ENCOUNTER — Ambulatory Visit (INDEPENDENT_AMBULATORY_CARE_PROVIDER_SITE_OTHER): Payer: BC Managed Care – PPO | Admitting: Gastroenterology

## 2011-05-07 ENCOUNTER — Encounter: Payer: Self-pay | Admitting: Gastroenterology

## 2011-05-07 VITALS — BP 133/85 | HR 81 | Temp 98.0°F | Ht 72.0 in | Wt 186.0 lb

## 2011-05-07 DIAGNOSIS — K219 Gastro-esophageal reflux disease without esophagitis: Secondary | ICD-10-CM

## 2011-05-07 NOTE — Progress Notes (Signed)
Primary Care Physician: Kari Baars, MD  Primary Gastroenterologist:  Roetta Sessions, MD   Chief Complaint  Patient presents with  . want to talk about medications    HPI: Eric Fisher is a 45 y.o. male here for followup of gastroesophageal reflux disease. Last seen in May 2011. 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 He is looking at life long Coumadin and possibly antiplatelet therapy. He presents today with questions regarding his acid reflux medication.  He states while he was in the hospital he was placed on pantoprazole. He states he did okay while in the hospital. He was sent home on pantoprazole 80 mg once daily. After about 3 weeks he started having pressure in his chest like he did previously with his acid reflux. He saw his family doctor who advised him to go back on Dexilant after discussion with his cardiologist. Patient has been back on Dexilant for 2 weeks. He is feeling better at this point. He admits to having a lot of anxiety since his heart issues have developed. He has concerns that he may not know when the chest pain is related to his heart or the reflux.   Current Outpatient Prescriptions  Medication Sig Dispense Refill  . ALPRAZolam (XANAX) 0.5 MG tablet       . aspirin 81 MG EC tablet Take 81 mg by mouth daily.        Marland Kitchen atorvastatin (LIPITOR) 80 MG tablet Take 80 mg by mouth daily.        Marland Kitchen dexlansoprazole (DEXILANT) 60 MG capsule Take 60 mg by mouth daily.        . metoprolol tartrate (LOPRESSOR) 25 MG tablet Take 1 tablet (25 mg total) by mouth 2 (two) times daily.  60 tablet  5  . nitroGLYCERIN (NITROSTAT) 0.4 MG SL tablet Place 0.4 mg under the tongue every 5 (five) minutes as needed.        . Ticagrelor (BRILINTA) 90 MG TABS tablet Take 180 mg by mouth daily.        Marland Kitchen warfarin (COUMADIN) 5 MG tablet Take 5 mg by mouth as directed. 1 pill everyday but Thursday On  Thursday take 1.5 pill        Allergies as of 05/07/2011  . (No Known Allergies)    ROS:  General: Negative for anorexia, weight loss, fever, chills, fatigue, weakness. ENT: Negative for hoarseness, difficulty swallowing , nasal congestion. CV: Negative for chest pain, angina, palpitations, dyspnea on exertion, peripheral edema.  Respiratory: Negative for dyspnea at rest, dyspnea on exertion, cough, sputum, wheezing.  GI: See history of present illness. No constipation, diarrhea, abd pain, vomiting, dysphagia. GU:  Negative for dysuria, hematuria, urinary incontinence, urinary frequency, nocturnal urination.  Endo: Negative for unusual weight change. Patient dropped about 10 pounds while in the hospital with his heart attack.   Physical Examination:   BP 133/85  Pulse 81  Temp(Src) 98 F (36.7 C) (Temporal)  Ht 6' (1.829 m)  Wt 186 lb (84.369 kg)  BMI 25.23 kg/m2  General: Well-nourished, well-developed in no acute distress.  Eyes: No icterus. Mouth: Oropharyngeal mucosa moist and pink , no lesions erythema or exudate. Lungs: Clear to auscultation bilaterally.  Heart: Regular rate and rhythm, no murmurs rubs or gallops.  Abdomen: Bowel sounds are normal, nontender, nondistended, no hepatosplenomegaly or masses, no abdominal bruits or hernia , no rebound or guarding.  Extremities: No lower extremity edema. No clubbing or deformities. Neuro: Alert and oriented x 4   Skin: Warm and dry, no jaundice.   Psych: Alert and cooperative, normal mood and affect.

## 2011-05-07 NOTE — Assessment & Plan Note (Signed)
Gastroesophageal reflux disease well documented on prior Bravo pH study in 2009. He did have correlation with this chest pain mass reflux disease at that time. Patient tells me that his cardiologist is aware that he is on Dexilant at this point. There should be no issues between Dexilant and Coumadin/Brilinta. At this point it is important to adequately control his reflux in hopes to eliminate it as a cause of chest pain. Reinforced antireflux measures with patient today as well.  As discussed with patient today, I will bring Dr.Rourk up-to-date on patient's medical history. We'll let patient know if there any further recommendations.  Office visit in one year or sooner if needed.

## 2011-05-07 NOTE — Progress Notes (Signed)
Cc to PCP 

## 2011-05-09 ENCOUNTER — Encounter (HOSPITAL_COMMUNITY)
Admission: RE | Admit: 2011-05-09 | Discharge: 2011-05-09 | Disposition: A | Discharge status: Home / Self Care | Payer: BC Managed Care – PPO | Source: Ambulatory Visit | Attending: Cardiovascular Disease | Admitting: Cardiovascular Disease

## 2011-05-09 DIAGNOSIS — K8020 Calculus of gallbladder without cholecystitis without obstruction: Secondary | ICD-10-CM

## 2011-05-09 DIAGNOSIS — I2510 Atherosclerotic heart disease of native coronary artery without angina pectoris: Secondary | ICD-10-CM | POA: Insufficient documentation

## 2011-05-09 DIAGNOSIS — Z5189 Encounter for other specified aftercare: Secondary | ICD-10-CM | POA: Insufficient documentation

## 2011-05-09 DIAGNOSIS — I252 Old myocardial infarction: Secondary | ICD-10-CM | POA: Insufficient documentation

## 2011-05-09 DIAGNOSIS — K824 Cholesterolosis of gallbladder: Secondary | ICD-10-CM

## 2011-05-09 HISTORY — DX: Cholesterolosis of gallbladder: K82.4

## 2011-05-09 HISTORY — DX: Calculus of gallbladder without cholecystitis without obstruction: K80.20

## 2011-05-11 ENCOUNTER — Encounter (HOSPITAL_COMMUNITY)
Admission: RE | Admit: 2011-05-11 | Discharge: 2011-05-11 | Disposition: A | Discharge status: Home / Self Care | Payer: BC Managed Care – PPO | Source: Ambulatory Visit | Attending: Cardiovascular Disease | Admitting: Cardiovascular Disease

## 2011-05-12 ENCOUNTER — Other Ambulatory Visit: Payer: Self-pay

## 2011-05-12 ENCOUNTER — Emergency Department (HOSPITAL_COMMUNITY): Payer: BC Managed Care – PPO

## 2011-05-12 ENCOUNTER — Emergency Department (HOSPITAL_COMMUNITY)
Admission: EM | Admit: 2011-05-12 | Discharge: 2011-05-12 | Disposition: A | Discharge status: Home / Self Care | Payer: BC Managed Care – PPO | Attending: Emergency Medicine | Admitting: Emergency Medicine

## 2011-05-12 ENCOUNTER — Encounter (HOSPITAL_COMMUNITY): Payer: Self-pay | Admitting: *Deleted

## 2011-05-12 DIAGNOSIS — Z7901 Long term (current) use of anticoagulants: Secondary | ICD-10-CM | POA: Insufficient documentation

## 2011-05-12 DIAGNOSIS — I252 Old myocardial infarction: Secondary | ICD-10-CM | POA: Insufficient documentation

## 2011-05-12 DIAGNOSIS — F411 Generalized anxiety disorder: Secondary | ICD-10-CM | POA: Insufficient documentation

## 2011-05-12 DIAGNOSIS — Z7982 Long term (current) use of aspirin: Secondary | ICD-10-CM | POA: Insufficient documentation

## 2011-05-12 DIAGNOSIS — I2510 Atherosclerotic heart disease of native coronary artery without angina pectoris: Secondary | ICD-10-CM | POA: Insufficient documentation

## 2011-05-12 DIAGNOSIS — R079 Chest pain, unspecified: Secondary | ICD-10-CM

## 2011-05-12 LAB — CARDIAC PANEL(CRET KIN+CKTOT+MB+TROPI)
Relative Index: 2.1 (ref 0.0–2.5)
Total CK: 116 U/L (ref 7–232)

## 2011-05-12 LAB — COMPREHENSIVE METABOLIC PANEL
ALT: 63 U/L — ABNORMAL HIGH (ref 0–53)
AST: 29 U/L (ref 0–37)
CO2: 25 mEq/L (ref 19–32)
Calcium: 10 mg/dL (ref 8.4–10.5)
Chloride: 104 mEq/L (ref 96–112)
Creatinine, Ser: 0.72 mg/dL (ref 0.50–1.35)
GFR calc Af Amer: >60 mL/min (ref >60)
GFR calc non Af Amer: >60 mL/min (ref >60)
Glucose, Bld: 125 mg/dL — ABNORMAL HIGH (ref 70–99)
Total Bilirubin: 0.7 mg/dL (ref 0.3–1.2)

## 2011-05-12 LAB — DIFFERENTIAL
Basophils Absolute: 0 10*3/uL (ref 0.0–0.1)
Eosinophils Relative: 1 % (ref 0–5)
Lymphocytes Relative: 29 % (ref 12–46)
Lymphs Abs: 1.8 10*3/uL (ref 0.7–4.0)
Monocytes Absolute: 0.5 10*3/uL (ref 0.1–1.0)
Neutro Abs: 3.9 10*3/uL (ref 1.7–7.7)

## 2011-05-12 LAB — CBC
MCH: 29.4 pg (ref 26.0–34.0)
MCHC: 33.6 g/dL (ref 30.0–36.0)
Platelets: 174 10*3/uL (ref 150–400)
RBC: 4.83 MIL/uL (ref 4.22–5.81)
RDW: 13.7 % (ref 11.5–15.5)

## 2011-05-12 LAB — PROTIME-INR: INR: 3.42 — ABNORMAL HIGH (ref 0.00–1.49)

## 2011-05-12 MED ORDER — KETOROLAC TROMETHAMINE 30 MG/ML IJ SOLN
30.0000 mg | Freq: Once | INTRAMUSCULAR | Status: AC
  Administered 2011-05-12: 30 mg via INTRAVENOUS
  Filled 2011-05-12: qty 1

## 2011-05-12 MED ORDER — HYDROCODONE-ACETAMINOPHEN 5-500 MG PO TABS: 1.0000 | ORAL_TABLET | Freq: Four times a day (QID) | ORAL | Status: AC | PRN

## 2011-05-12 NOTE — ED Provider Notes (Signed)
History     CSN: [id number] Arrival date & time: 05/12/2011  9:12 AM  Chief Complaint  Patient presents with  . Chest Pain    pt states pain began this am at 0645   HPI Comments: Patient states had an MI in June.  This morning woke up and states he had sharp pain in the center of his chest.  He reports this as different from his prior cardiac pain.  It is worse with a deep breath and there is no radiation to arm or jaw, sob, diaphoresis or nausea.    The history is provided by the patient.    Past Medical History  Diagnosis Date  . GERD (gastroesophageal reflux disease)   . 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 Hyperglycemia   . Coronary artery disease   . Anxiety     Past Surgical History  Procedure Date  . Knee arthroscopy   . Hemorroidectomy   . Cardiac catheterization 03/11/2011    This demonstrated marked ectasia of the proximal LAD with suggestion of a large filling defect consistent  with thrombus.  There was clot extending into the first septal   perforating branch which had slow flow.   . Transthoracic echocardiogram 03/13/2011    Left ventricle: Mild distal septal hypokinesis The cavity size was normal. Systolic function was normal. The estimated ejection fraction was 55%. Wall motion was normal  . Esophagogastroduodenoscopy 02/2008    [facility]  . Bravo ph study 02/2008    Day 1, 155 episodes of acid reflux with longest episode 29 minutes, [place] 41.7. Day 2, 51 episodes, [place] 12.4. Two of three episodes of chest pain correlated with episode of acid reflux. Study done OFF PPI.    Family History  Problem Relation Age of Onset  . Arrhythmia Father   . Colon cancer Neg Hx     History  Substance Use Topics  . Smoking status: Never Smoker   . Smokeless tobacco: Not on file  . Alcohol Use: No      Review of Systems  Constitutional: Negative for fever and activity change.  Respiratory: Negative for cough and shortness of breath.     Cardiovascular: Positive for chest pain. Negative for palpitations and leg swelling.  All other systems reviewed and are negative.    Physical Exam  BP 116/80  Pulse 80  Temp(Src) 98 F (36.7 C) (Oral)  Resp 18  Ht 6' (1.829 m)  Wt 184 lb (83.462 kg)  BMI 24.95 kg/m2  SpO2 97%  Physical Exam  Constitutional: He is oriented to person, place, and time. He appears well-developed and well-nourished. No distress.  HENT:  Head: Normocephalic and atraumatic.  Neck: Normal range of motion. Neck supple.  Cardiovascular: Normal rate, regular rhythm and normal heart sounds.  Exam reveals no gallop and no friction rub.   No murmur heard. Pulmonary/Chest: Effort normal and breath sounds normal. No respiratory distress. He has no rales.  Abdominal: Soft. There is no tenderness. There is no rebound.  Musculoskeletal: Normal range of motion. He exhibits no edema and no tenderness.  Neurological: He is alert and oriented to person, place, and time.  Skin: Skin is warm and dry. He is not diaphoretic.    ED Course  Procedures  MDM EKG shows NSR @ 79 bpm.  Feels better with toradol.    Sp[facility] with [facility] Dietrich Pates at [facility], recommends rule out for MI here.  He reviewed chart and  believes patient is on maximal anti-thrombotic therapy.  Had no lesions on cath and does not require repeat cath.  Repeat enzymes okay, will discharge to home.      Geoffery Lyons, MD 05/12/11 [phone]

## 2011-05-12 NOTE — ED Notes (Signed)
Pt states he had heart attack this past June 3rd pain is not similar to pain in past. Pt c/o dull pain to center of chest.

## 2011-05-14 ENCOUNTER — Encounter (HOSPITAL_COMMUNITY): Payer: BC Managed Care – PPO

## 2011-05-15 ENCOUNTER — Ambulatory Visit (INDEPENDENT_AMBULATORY_CARE_PROVIDER_SITE_OTHER): Payer: BC Managed Care – PPO | Admitting: *Deleted

## 2011-05-15 DIAGNOSIS — I749 Embolism and thrombosis of unspecified artery: Secondary | ICD-10-CM

## 2011-05-16 ENCOUNTER — Encounter (HOSPITAL_COMMUNITY)
Admission: RE | Admit: 2011-05-16 | Discharge: 2011-05-16 | Disposition: A | Discharge status: Home / Self Care | Payer: BC Managed Care – PPO | Source: Ambulatory Visit | Attending: Cardiovascular Disease | Admitting: Cardiovascular Disease

## 2011-05-17 NOTE — Progress Notes (Signed)
agree

## 2011-05-18 ENCOUNTER — Encounter (HOSPITAL_COMMUNITY)
Admission: RE | Admit: 2011-05-18 | Discharge: 2011-05-18 | Disposition: A | Discharge status: Home / Self Care | Payer: BC Managed Care – PPO | Source: Ambulatory Visit | Attending: Cardiovascular Disease | Admitting: Cardiovascular Disease

## 2011-05-21 ENCOUNTER — Encounter (HOSPITAL_COMMUNITY)
Admission: RE | Admit: 2011-05-21 | Discharge: 2011-05-21 | Disposition: A | Discharge status: Home / Self Care | Payer: BC Managed Care – PPO | Source: Ambulatory Visit | Attending: Cardiovascular Disease | Admitting: Cardiovascular Disease

## 2011-05-23 ENCOUNTER — Encounter (HOSPITAL_COMMUNITY)
Admission: RE | Admit: 2011-05-23 | Discharge: 2011-05-23 | Disposition: A | Discharge status: Home / Self Care | Payer: BC Managed Care – PPO | Source: Ambulatory Visit | Attending: Cardiovascular Disease | Admitting: Cardiovascular Disease

## 2011-05-25 ENCOUNTER — Encounter (HOSPITAL_COMMUNITY)
Admission: RE | Admit: 2011-05-25 | Discharge: 2011-05-25 | Disposition: A | Discharge status: Home / Self Care | Payer: BC Managed Care – PPO | Source: Ambulatory Visit | Attending: Cardiovascular Disease | Admitting: Cardiovascular Disease

## 2011-05-28 ENCOUNTER — Encounter (HOSPITAL_COMMUNITY)
Admission: RE | Admit: 2011-05-28 | Discharge: 2011-05-28 | Disposition: A | Discharge status: Home / Self Care | Payer: BC Managed Care – PPO | Source: Ambulatory Visit | Attending: Cardiovascular Disease | Admitting: Cardiovascular Disease

## 2011-05-29 ENCOUNTER — Ambulatory Visit (INDEPENDENT_AMBULATORY_CARE_PROVIDER_SITE_OTHER): Payer: BC Managed Care – PPO | Admitting: *Deleted

## 2011-05-29 ENCOUNTER — Encounter: Payer: Self-pay | Admitting: Gastroenterology

## 2011-05-29 ENCOUNTER — Other Ambulatory Visit: Payer: Self-pay | Admitting: Gastroenterology

## 2011-05-29 ENCOUNTER — Ambulatory Visit (INDEPENDENT_AMBULATORY_CARE_PROVIDER_SITE_OTHER): Payer: BC Managed Care – PPO | Admitting: Gastroenterology

## 2011-05-29 VITALS — BP 126/74 | HR 87 | Temp 97.7°F | Ht 73.0 in | Wt 186.4 lb

## 2011-05-29 DIAGNOSIS — I749 Embolism and thrombosis of unspecified artery: Secondary | ICD-10-CM

## 2011-05-29 DIAGNOSIS — R1013 Epigastric pain: Secondary | ICD-10-CM

## 2011-05-29 DIAGNOSIS — K219 Gastro-esophageal reflux disease without esophagitis: Secondary | ICD-10-CM

## 2011-05-29 LAB — HEPATIC FUNCTION PANEL
ALT: 57 U/L — ABNORMAL HIGH (ref 0–53)
AST: 32 U/L (ref 0–37)
Alkaline Phosphatase: 64 U/L (ref 39–117)
Bilirubin, Direct: 0.2 mg/dL (ref 0.0–0.3)
Total Bilirubin: 0.8 mg/dL (ref 0.3–1.2)

## 2011-05-29 MED ORDER — RANITIDINE HCL 150 MG PO TABS: 150.0000 mg | ORAL_TABLET | Freq: Two times a day (BID) | ORAL | Status: DC

## 2011-05-29 NOTE — Assessment & Plan Note (Signed)
Epigastric pain, acute onset, associated with acid taste in mouth. Last ED visit, ALT up some. GB in situ. Check abd u/s. Check labs. Take Zantac 150mg  po bid prn for epigastric tenderness or GERD symptoms. Discussed with patient that if symptoms are recurrent, would consider esophagram/ugi series vs EGD on anticoagulation/antiplatelet therapy.

## 2011-05-29 NOTE — Progress Notes (Signed)
Cc to PCP 

## 2011-05-29 NOTE — Progress Notes (Signed)
Primary Care Physician: Kari Baars, MD  Primary Gastroenterologist:  Roetta Sessions, MD   Chief Complaint  Patient presents with  . Gastrophageal Reflux    hurting in chest because of acid    HPI: Eric Fisher is a 45 y.o. male here for further evaluation of GERD. Last seen in July 2012. 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 He is on Coumadin and Brilinta. At last OV he was doing well on Dexilant. Failed pantoprazole 80mg  daily. Seen in ED on 05/12/11 for substernal chest pain, ruled-out for MI. ?musculoskeletal. Treated with single dose of Toradol IV and Vicodin. No NSAIDS as outpatient. Patient was doing well until yesterday. Two hours after completing cardiac rehab he noted sharp epigastric pain, bad taste in mouth. Took Vicodin for the pain, 1/2 mg Xanax. Ate bowl of cereal prior to cardiac rehab. Pain resolved later yesterday afternoon but he still feels acid taste in his mouth. Ate lightly afterwards without issues. Denies bowel issues. No blood in stool. Goes back for INR check today, last one was above 4.   Current Outpatient Prescriptions  Medication Sig Dispense Refill  . ALPRAZolam (XANAX) 0.5 MG tablet Take 0.5 mg by mouth daily as needed. For anxiety       . aspirin 81 MG EC tablet Take 81 mg by mouth daily.        Marland Kitchen atorvastatin (LIPITOR) 80 MG tablet Take 80 mg by mouth at bedtime.       Marland Kitchen dexlansoprazole (DEXILANT) 60 MG capsule Take 60 mg by mouth daily.        . metoprolol tartrate (LOPRESSOR) 25 MG tablet Take 1 tablet (25 mg total) by mouth 2 (two) times daily.  60 tablet  5  . nitroGLYCERIN (NITROSTAT) 0.4 MG SL tablet Place 0.4 mg under the tongue every 5 (five) minutes as needed.        . Ticagrelor (BRILINTA) 90 MG TABS tablet Take 90 mg by mouth every 12 (twelve) hours.       Marland Kitchen warfarin (COUMADIN) 5 MG tablet Take 5 mg by mouth at bedtime. 1 pill everyday but  Thursday On Thursday take 1.5 pill      . ranitidine (ZANTAC) 150 MG tablet Take 1 tablet (150 mg total) by mouth 2 (two) times daily.  60 tablet  1    Allergies as of 05/29/2011  . (No Known Allergies)    ROS:  General: Negative for anorexia, weight loss, fever, chills, fatigue, weakness. ENT: Negative for hoarseness, difficulty swallowing , nasal congestion. CV: Negative for chest pain, angina, palpitations, dyspnea on exertion, peripheral edema.  Respiratory: Negative for dyspnea at rest, dyspnea on exertion, cough, sputum, wheezing.  GI: See history of present illness. GU:  Negative for dysuria, hematuria, urinary incontinence, urinary frequency, nocturnal urination.  Endo: Negative for unusual weight change.    Physical Examination:   BP 126/74  Pulse 87  Temp(Src) 97.7 F (36.5 C) (Temporal)  Ht 6\' 1"  (1.854 m)  Wt 186 lb 6.4 oz (84.55 kg)  BMI 24.59 kg/m2  General: Well-nourished, well-developed in no acute distress.  Eyes: No icterus. Mouth: Oropharyngeal mucosa moist and pink , no lesions erythema or exudate. Lungs: Clear to auscultation bilaterally.  Heart: Regular rate and rhythm, no murmurs rubs or gallops.  Abdomen: Bowel sounds are normal, nontender, nondistended, no hepatosplenomegaly or masses, no abdominal bruits or hernia , no  rebound or guarding.   Extremities: No lower extremity edema. No clubbing or deformities. Neuro: Alert and oriented x 4   Skin: Warm and dry, no jaundice.   Psych: Alert and cooperative, normal mood and affect.  Labs:  Lab Results  Component Value Date   WBC 6.1 05/12/2011   HGB 14.2 05/12/2011   HCT 42.3 05/12/2011   MCV 87.6 05/12/2011   PLT 174 05/12/2011   Lab Results  Component Value Date   ALT 63* 05/12/2011   AST 29 05/12/2011   ALKPHOS 79 05/12/2011   BILITOT 0.7 05/12/2011   Lab Results  Component Value Date   CREATININE 0.72 05/12/2011   BUN 8 05/12/2011   NA 141 05/12/2011   K 3.7 05/12/2011   CL 104 05/12/2011   CO2 25 05/12/2011     No results found for this basename: LIPASE    Imaging Studies: Dg Chest 2 View  05/12/2011  *RADIOLOGY REPORT*  Clinical Data: Chest pain.  History of MI in June.  CHEST - 2 VIEW  Comparison: 03/25/2011  Findings: Cardiac size is within normal limits.  No evidence for pulmonary edema.  There are no focal consolidations or pleural effusions. Visualized osseous structures have a normal appearance.  IMPRESSION: Negative exam.  Original Report Authenticated By: Patterson Hammersmith, M.D.

## 2011-05-30 ENCOUNTER — Encounter (HOSPITAL_COMMUNITY)
Admission: RE | Admit: 2011-05-30 | Discharge: 2011-05-30 | Disposition: A | Discharge status: Home / Self Care | Payer: BC Managed Care – PPO | Source: Ambulatory Visit | Attending: Cardiovascular Disease | Admitting: Cardiovascular Disease

## 2011-05-30 NOTE — Progress Notes (Signed)
Quick Note:  ALT up a little. May be secondary to lipitor. Add on Hep C ab, Heb B surface antigen, iron and tibc, ferritin. Await abd u/s. ______

## 2011-05-31 LAB — FERRITIN: Ferritin: 225 ng/mL (ref 22–322)

## 2011-05-31 LAB — HEPATITIS B SURFACE ANTIGEN: Hepatitis B Surface Ag: NEGATIVE

## 2011-05-31 LAB — HEPATITIS C ANTIBODY: HCV Ab: NEGATIVE

## 2011-05-31 LAB — IRON AND TIBC
%SAT: 26 % (ref 20–55)
TIBC: 374 ug/dL (ref 215–435)
UIBC: 276 ug/dL

## 2011-06-01 ENCOUNTER — Ambulatory Visit (HOSPITAL_COMMUNITY)
Admission: RE | Admit: 2011-06-01 | Discharge: 2011-06-01 | Disposition: A | Discharge status: Home / Self Care | Payer: BC Managed Care – PPO | Source: Ambulatory Visit | Attending: Gastroenterology | Admitting: Gastroenterology

## 2011-06-01 ENCOUNTER — Encounter (HOSPITAL_COMMUNITY)
Admission: RE | Admit: 2011-06-01 | Discharge: 2011-06-01 | Disposition: A | Discharge status: Home / Self Care | Payer: BC Managed Care – PPO | Source: Ambulatory Visit | Attending: Cardiovascular Disease | Admitting: Cardiovascular Disease

## 2011-06-01 DIAGNOSIS — K838 Other specified diseases of biliary tract: Secondary | ICD-10-CM | POA: Insufficient documentation

## 2011-06-01 DIAGNOSIS — K824 Cholesterolosis of gallbladder: Secondary | ICD-10-CM | POA: Insufficient documentation

## 2011-06-01 DIAGNOSIS — R1013 Epigastric pain: Secondary | ICD-10-CM | POA: Insufficient documentation

## 2011-06-04 ENCOUNTER — Other Ambulatory Visit: Payer: Self-pay | Admitting: Gastroenterology

## 2011-06-04 ENCOUNTER — Encounter (HOSPITAL_COMMUNITY)
Admission: RE | Admit: 2011-06-04 | Discharge: 2011-06-04 | Disposition: A | Discharge status: Home / Self Care | Payer: BC Managed Care – PPO | Source: Ambulatory Visit | Attending: Cardiovascular Disease | Admitting: Cardiovascular Disease

## 2011-06-04 NOTE — Progress Notes (Signed)
Quick Note:  Iron/tibc, viral markers normal/negative. Recheck LFTs in two months. See Abd u/s report. ______

## 2011-06-04 NOTE — Progress Notes (Signed)
Quick Note:  Tried to call pt- LM with mother for return call ______

## 2011-06-04 NOTE — Progress Notes (Signed)
Quick Note:  Please let patient know, he has no evidence of acute gallbladder inflammation/infection BUT he does have gb polyp and gallstones. Epigastric pain could have been secondary to gallstone or GERD.  If he has another bad episode of pain, would recommend LFTs in 4-6 hours after pain begins. This may have in determining if symptoms secondary to gallstones. Would recommend hold on any elective surgeries (ie elective gb surgery) for now since MI in 03/2011.  I would like for patient to see RMR to discuss all this. Have patient call with any further episodes or if GERD not well-controlled. ______

## 2011-06-04 NOTE — Progress Notes (Signed)
Quick Note:  He will need abd u/s in one year to follow-up on gb polyp unless he ends up with cholecystectomy in the interim. ______

## 2011-06-05 ENCOUNTER — Other Ambulatory Visit: Payer: Self-pay | Admitting: Gastroenterology

## 2011-06-05 LAB — HEPATIC FUNCTION PANEL
ALT: 50 U/L (ref 0–53)
Albumin: 4.9 g/dL (ref 3.5–5.2)
Total Protein: 7.4 g/dL (ref 6.0–8.3)

## 2011-06-05 MED ORDER — HYDROCODONE-ACETAMINOPHEN 7.5-500 MG PO TABS: 1.0000 | ORAL_TABLET | ORAL | Status: AC | PRN

## 2011-06-05 NOTE — Progress Notes (Signed)
Quick Note:  Tried to call patient, LMOAM at home and mobile number.  LFTs normal.  Need to discuss ongoing abdominal pain with patient. May need EGD prior to referral to surgeon for gb. ______

## 2011-06-05 NOTE — Progress Notes (Signed)
Quick Note:  Discussed with patient. Having pain in epigastrium and into shoulder blades every day. Worse with movement. Not worse with meals. Couple of episodes of acid taste in mouth better with Zantac. Zantac does not help pain. No symptoms on the weekend when he wasn't at work. No symptoms at cardiac rehab on M/W/F.   Please call in Lortab 7.5/500mg , take one every 4-6 hours prn. #20, no refills. I will discuss with Dr. Jena Gauss, and get back in touch with patient later today. Suspect EGD as next step. ______

## 2011-06-06 ENCOUNTER — Encounter (HOSPITAL_COMMUNITY)
Admission: RE | Admit: 2011-06-06 | Discharge: 2011-06-06 | Disposition: A | Discharge status: Home / Self Care | Payer: BC Managed Care – PPO | Source: Ambulatory Visit | Attending: Cardiovascular Disease | Admitting: Cardiovascular Disease

## 2011-06-06 ENCOUNTER — Other Ambulatory Visit: Payer: Self-pay | Admitting: Internal Medicine

## 2011-06-06 DIAGNOSIS — K219 Gastro-esophageal reflux disease without esophagitis: Secondary | ICD-10-CM

## 2011-06-06 NOTE — Progress Notes (Signed)
Quick Note:  Discussed with Dr. Jena Gauss. Symptoms somewhat atypical for gb disease and gerd but he recommends EGD to look at esophagus/stomach. He will not take any biopsies etc due to patients coumadin and antiplatelet therapy.   Please schedule EGD with RMR. Continue coumadin and brilinta. ______

## 2011-06-06 NOTE — Progress Notes (Signed)
Quick Note:  Pt scheduled for 09/11 @ 11:15- Pt aware and instructions mailed ______

## 2011-06-08 ENCOUNTER — Other Ambulatory Visit: Payer: Self-pay | Admitting: Internal Medicine

## 2011-06-08 ENCOUNTER — Encounter (HOSPITAL_COMMUNITY)
Admission: RE | Admit: 2011-06-08 | Discharge: 2011-06-08 | Disposition: A | Discharge status: Home / Self Care | Payer: BC Managed Care – PPO | Source: Ambulatory Visit | Attending: Cardiovascular Disease | Admitting: Cardiovascular Disease

## 2011-06-08 DIAGNOSIS — K219 Gastro-esophageal reflux disease without esophagitis: Secondary | ICD-10-CM

## 2011-06-08 NOTE — Progress Notes (Signed)
Pt was originally scheduled for 11:15, per Selena Batten @ [facility], pts case has been moved to 1:00, I called [name] and informed him of this and offered him another day but he wished to stay on 09/11 and was ok with the time being moved. New instructions mailed

## 2011-06-11 ENCOUNTER — Encounter (HOSPITAL_COMMUNITY): Payer: BC Managed Care – PPO

## 2011-06-12 ENCOUNTER — Ambulatory Visit (INDEPENDENT_AMBULATORY_CARE_PROVIDER_SITE_OTHER): Payer: BC Managed Care – PPO | Admitting: *Deleted

## 2011-06-12 DIAGNOSIS — I749 Embolism and thrombosis of unspecified artery: Secondary | ICD-10-CM

## 2011-06-12 LAB — POCT INR: INR: 3.3

## 2011-06-13 ENCOUNTER — Encounter (HOSPITAL_COMMUNITY)
Admission: RE | Admit: 2011-06-13 | Discharge: 2011-06-13 | Disposition: A | Discharge status: Home / Self Care | Payer: BC Managed Care – PPO | Source: Ambulatory Visit | Attending: Cardiovascular Disease | Admitting: Cardiovascular Disease

## 2011-06-13 DIAGNOSIS — I252 Old myocardial infarction: Secondary | ICD-10-CM | POA: Insufficient documentation

## 2011-06-13 DIAGNOSIS — I2510 Atherosclerotic heart disease of native coronary artery without angina pectoris: Secondary | ICD-10-CM | POA: Insufficient documentation

## 2011-06-13 DIAGNOSIS — Z5189 Encounter for other specified aftercare: Secondary | ICD-10-CM | POA: Insufficient documentation

## 2011-06-15 ENCOUNTER — Encounter (HOSPITAL_COMMUNITY)
Admission: RE | Admit: 2011-06-15 | Discharge: 2011-06-15 | Disposition: A | Discharge status: Home / Self Care | Payer: BC Managed Care – PPO | Source: Ambulatory Visit | Attending: Cardiovascular Disease | Admitting: Cardiovascular Disease

## 2011-06-18 ENCOUNTER — Encounter (HOSPITAL_COMMUNITY)
Admission: RE | Admit: 2011-06-18 | Discharge: 2011-06-18 | Disposition: A | Discharge status: Home / Self Care | Payer: BC Managed Care – PPO | Source: Ambulatory Visit | Attending: Cardiovascular Disease | Admitting: Cardiovascular Disease

## 2011-06-18 MED ORDER — SODIUM CHLORIDE 0.45 % IV SOLN
Freq: Once | INTRAVENOUS | Status: AC
  Administered 2011-06-19: 12:00:00 via INTRAVENOUS

## 2011-06-19 ENCOUNTER — Encounter (HOSPITAL_COMMUNITY)
Admission: RE | Disposition: A | Discharge status: Home / Self Care | Payer: Self-pay | Source: Ambulatory Visit | Attending: Internal Medicine

## 2011-06-19 ENCOUNTER — Ambulatory Visit (HOSPITAL_COMMUNITY)
Admission: RE | Admit: 2011-06-19 | Discharge: 2011-06-19 | Disposition: A | Discharge status: Home / Self Care | Payer: BC Managed Care – PPO | Source: Ambulatory Visit | Attending: Internal Medicine | Admitting: Internal Medicine

## 2011-06-19 ENCOUNTER — Encounter (HOSPITAL_COMMUNITY): Payer: Self-pay | Admitting: *Deleted

## 2011-06-19 DIAGNOSIS — R079 Chest pain, unspecified: Secondary | ICD-10-CM

## 2011-06-19 DIAGNOSIS — R1013 Epigastric pain: Secondary | ICD-10-CM

## 2011-06-19 DIAGNOSIS — K449 Diaphragmatic hernia without obstruction or gangrene: Secondary | ICD-10-CM | POA: Insufficient documentation

## 2011-06-19 DIAGNOSIS — Z7901 Long term (current) use of anticoagulants: Secondary | ICD-10-CM | POA: Insufficient documentation

## 2011-06-19 DIAGNOSIS — Z7982 Long term (current) use of aspirin: Secondary | ICD-10-CM | POA: Insufficient documentation

## 2011-06-19 HISTORY — PX: ESOPHAGOGASTRODUODENOSCOPY: SHX5428

## 2011-06-19 SURGERY — EGD (ESOPHAGOGASTRODUODENOSCOPY)
Anesthesia: Moderate Sedation

## 2011-06-19 MED ORDER — MEPERIDINE HCL 100 MG/ML IJ SOLN
INTRAMUSCULAR | Status: DC | PRN
  Administered 2011-06-19: 25 mg via INTRAVENOUS
  Administered 2011-06-19: 50 mg via INTRAVENOUS
  Administered 2011-06-19: 25 mg via INTRAVENOUS

## 2011-06-19 MED ORDER — MIDAZOLAM HCL 5 MG/5ML IJ SOLN
INTRAMUSCULAR | Status: DC | PRN
  Administered 2011-06-19 (×3): 2 mg via INTRAVENOUS

## 2011-06-19 MED ORDER — MIDAZOLAM HCL 5 MG/5ML IJ SOLN
INTRAMUSCULAR | Status: AC
  Filled 2011-06-19: qty 10

## 2011-06-19 MED ORDER — RABEPRAZOLE SODIUM 20 MG PO TBEC: 20.0000 mg | DELAYED_RELEASE_TABLET | Freq: Every day | ORAL | Status: DC

## 2011-06-19 MED ORDER — MEPERIDINE HCL 100 MG/ML IJ SOLN
INTRAMUSCULAR | Status: AC
  Filled 2011-06-19: qty 2

## 2011-06-19 MED ORDER — BUTAMBEN-TETRACAINE-BENZOCAINE 2-2-14 % EX AERO
INHALATION_SPRAY | CUTANEOUS | Status: DC | PRN
  Administered 2011-06-19: 2 via TOPICAL

## 2011-06-19 NOTE — H&P (Addendum)
Tana Coast, PA  05/29/2011  8:44 AM  Signed Primary Care Physician: Kari Baars, MD   Primary Gastroenterologist:  Roetta Sessions, MD      Chief Complaint   Patient presents with   .  Gastrophageal Reflux       hurting in chest because of acid      HPI: Eric Fisher is a 45 y.o. male here for further evaluation of GERD. Last seen in July 2012. 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 He is on Coumadin and Brilinta. At last OV he was doing well on Dexilant. Failed pantoprazole 80mg  daily. Seen in ED on 05/12/11 for substernal chest pain, ruled-out for MI. ?musculoskeletal. Treated with single dose of Toradol IV and Vicodin. No NSAIDS as outpatient. Patient was doing well until yesterday. Two hours after completing cardiac rehab he noted sharp epigastric pain, bad taste in mouth. Took Vicodin for the pain, 1/2 mg Xanax. Ate bowl of cereal prior to cardiac rehab. Pain resolved later yesterday afternoon but he still feels acid taste in his mouth. Ate lightly afterwards without issues. Denies bowel issues. No blood in stool. Goes back for INR check today, last one was above 4.      Current Outpatient Prescriptions   Medication  Sig  Dispense  Refill   .  ALPRAZolam (XANAX) 0.5 MG tablet  Take 0.5 mg by mouth daily as needed. For anxiety           .  aspirin 81 MG EC tablet  Take 81 mg by mouth daily.           Marland Kitchen  atorvastatin (LIPITOR) 80 MG tablet  Take 80 mg by mouth at bedtime.          Marland Kitchen  dexlansoprazole (DEXILANT) 60 MG capsule  Take 60 mg by mouth daily.           .  metoprolol tartrate (LOPRESSOR) 25 MG tablet  Take 1 tablet (25 mg total) by mouth 2 (two) times daily.   60 tablet   5   .  nitroGLYCERIN (NITROSTAT) 0.4 MG SL tablet  Place 0.4 mg under the tongue every 5 (five) minutes as needed.           .  Ticagrelor (BRILINTA) 90 MG TABS tablet  Take 90 mg by mouth every 12 (twelve)  hours.          Marland Kitchen  warfarin (COUMADIN) 5 MG tablet  Take 5 mg by mouth at bedtime. 1 pill everyday but Thursday  On Thursday take 1.5 pill         .  ranitidine (ZANTAC) 150 MG tablet  Take 1 tablet (150 mg total) by mouth 2 (two) times daily.   60 tablet   1       Allergies as of 05/29/2011   .  (No Known Allergies)      ROS:   General: Negative for anorexia, weight loss, fever, chills, fatigue, weakness. ENT: Negative for hoarseness, difficulty swallowing , nasal congestion. CV: Negative for chest pain, angina, palpitations, dyspnea on exertion, peripheral edema.   Respiratory: Negative for dyspnea at rest, dyspnea on exertion, cough, sputum, wheezing.   GI: See history of present illness. GU:  Negative for dysuria, hematuria, urinary incontinence, urinary frequency, nocturnal urination.   Endo: Negative for unusual weight change.     Physical Examination:  BP 126/74  Pulse 87  Temp(Src) 97.7 F (36.5 C) (Temporal)  Ht 6\' 1"  (1.854 m)  Wt 186 lb 6.4 oz (84.55 kg)  BMI 24.59 kg/m2   General: Well-nourished, well-developed in no acute distress.   Eyes: No icterus. Mouth: Oropharyngeal mucosa moist and pink , no lesions erythema or exudate. Lungs: Clear to auscultation bilaterally.   Heart: Regular rate and rhythm, no murmurs rubs or gallops.   Abdomen: Bowel sounds are normal, nontender, nondistended, no hepatosplenomegaly or masses, no abdominal bruits or hernia , no rebound or guarding.    Extremities: No lower extremity edema. No clubbing or deformities. Neuro: Alert and oriented x 4    Skin: Warm and dry, no jaundice.    Psych: Alert and cooperative, normal mood and affect.   Labs:   Lab Results   Component  Value  Date     WBC  6.1  05/12/2011     HGB  14.2  05/12/2011     HCT  42.3  05/12/2011     MCV  87.6  05/12/2011     PLT  174  05/12/2011    Lab Results   Component  Value  Date     ALT  63*  05/12/2011     AST  29  05/12/2011     ALKPHOS  79  05/12/2011      BILITOT  0.7  05/12/2011    Lab Results   Component  Value  Date     CREATININE  0.72  05/12/2011     BUN  8  05/12/2011     NA  141  05/12/2011     K  3.7  05/12/2011     CL  104  05/12/2011     CO2  25  05/12/2011    No results found for this basename: LIPASE      Imaging Studies: Dg Chest 2 View   05/12/2011  *RADIOLOGY REPORT*  Clinical Data: Chest pain.  History of MI in June.  CHEST - 2 VIEW  Comparison: 03/25/2011  Findings: Cardiac size is within normal limits.  No evidence for pulmonary edema.  There are no focal consolidations or pleural effusions. Visualized osseous structures have a normal appearance.  IMPRESSION: Negative exam.  Original Report Authenticated By: Patterson Hammersmith, M.D.                 Glendora Score  05/29/2011  9:08 AM  Signed Cc to PCP  Tana Coast, PA  05/30/2011  8:16 AM  Signed Quick Note:   ALT up a little. May be secondary to lipitor. Add on Hep C ab, Heb B surface antigen, iron and tibc, ferritin. Await abd u/s. ______        Epigastric pain - Tana Coast, PA  05/29/2011  8:43 AM  Signed Epigastric pain, acute onset, associated with acid taste in mouth. Last ED visit, ALT up some. GB in situ. Check abd u/s. Check labs. Take Zantac 150mg  po bid prn for epigastric tenderness or GERD symptoms. Discussed with patient that if symptoms are recurrent, would consider esophagram/ugi series vs EGD on anticoagulation/antiplatelet therapy.     I have seen the patient prior to the procedure(s) today and reviewed the history and physical / consultation from 05/28/20 .  There have been no changes. After consideration of the risks, benefits, alternatives and imponderables, the patient has consented to the procedure(s).

## 2011-06-20 ENCOUNTER — Encounter (HOSPITAL_COMMUNITY): Payer: BC Managed Care – PPO

## 2011-06-22 ENCOUNTER — Encounter (HOSPITAL_COMMUNITY): Payer: BC Managed Care – PPO

## 2011-06-25 ENCOUNTER — Encounter (HOSPITAL_COMMUNITY)
Admission: RE | Admit: 2011-06-25 | Discharge: 2011-06-25 | Disposition: A | Discharge status: Home / Self Care | Payer: BC Managed Care – PPO | Source: Ambulatory Visit | Attending: Cardiovascular Disease | Admitting: Cardiovascular Disease

## 2011-06-26 ENCOUNTER — Ambulatory Visit (INDEPENDENT_AMBULATORY_CARE_PROVIDER_SITE_OTHER): Payer: BC Managed Care – PPO | Admitting: *Deleted

## 2011-06-26 DIAGNOSIS — I749 Embolism and thrombosis of unspecified artery: Secondary | ICD-10-CM

## 2011-06-26 LAB — POCT INR: INR: 2

## 2011-06-27 ENCOUNTER — Encounter (HOSPITAL_COMMUNITY): Payer: Self-pay | Admitting: Internal Medicine

## 2011-06-27 ENCOUNTER — Encounter (HOSPITAL_COMMUNITY)
Admission: RE | Admit: 2011-06-27 | Discharge: 2011-06-27 | Disposition: A | Discharge status: Home / Self Care | Payer: BC Managed Care – PPO | Source: Ambulatory Visit | Attending: Cardiovascular Disease | Admitting: Cardiovascular Disease

## 2011-06-29 ENCOUNTER — Encounter (HOSPITAL_COMMUNITY)
Admission: RE | Admit: 2011-06-29 | Discharge: 2011-06-29 | Disposition: A | Discharge status: Home / Self Care | Payer: BC Managed Care – PPO | Source: Ambulatory Visit | Attending: Cardiovascular Disease | Admitting: Cardiovascular Disease

## 2011-07-02 ENCOUNTER — Encounter (HOSPITAL_COMMUNITY)
Admission: RE | Admit: 2011-07-02 | Discharge: 2011-07-02 | Disposition: A | Discharge status: Home / Self Care | Payer: BC Managed Care – PPO | Source: Ambulatory Visit | Attending: Cardiovascular Disease | Admitting: Cardiovascular Disease

## 2011-07-03 ENCOUNTER — Telehealth: Payer: Self-pay

## 2011-07-03 NOTE — Telephone Encounter (Signed)
Pt called to give [place] an update.He stated the he is doing better no burning but there is some hurting across the chest area from right to left someday's.He said they he call Monday and left a message on the voice mail.

## 2011-07-03 NOTE — Telephone Encounter (Signed)
RMR had requested PR. Please save for RMR. Appears patient is better. Will let RMR decide about the HIDA.

## 2011-07-04 ENCOUNTER — Encounter (HOSPITAL_COMMUNITY)
Admission: RE | Admit: 2011-07-04 | Discharge: 2011-07-04 | Disposition: A | Discharge status: Home / Self Care | Payer: BC Managed Care – PPO | Source: Ambulatory Visit | Attending: Cardiovascular Disease | Admitting: Cardiovascular Disease

## 2011-07-04 MED ORDER — RABEPRAZOLE SODIUM 20 MG PO TBEC: 20.0000 mg | DELAYED_RELEASE_TABLET | Freq: Every day | ORAL | Status: DC

## 2011-07-04 NOTE — Telephone Encounter (Signed)
rx faxed to [facility].

## 2011-07-04 NOTE — Telephone Encounter (Signed)
Pt stated he was feeling a little better. The discomfort is located under his ribs on both sides instead of in the center of his chest now. Pt feels like aciphex is helping and needs an rx written and sent to [facility] for #90 day supply.  Informed him we will let him know if RMR wants HIDA done.

## 2011-07-05 ENCOUNTER — Encounter: Payer: Self-pay | Admitting: Cardiovascular Disease

## 2011-07-05 ENCOUNTER — Ambulatory Visit (INDEPENDENT_AMBULATORY_CARE_PROVIDER_SITE_OTHER): Payer: BC Managed Care – PPO | Admitting: *Deleted

## 2011-07-05 ENCOUNTER — Ambulatory Visit (INDEPENDENT_AMBULATORY_CARE_PROVIDER_SITE_OTHER): Payer: BC Managed Care – PPO | Admitting: Cardiovascular Disease

## 2011-07-05 VITALS — BP 118/72 | HR 64 | Ht 71.0 in | Wt 179.6 lb

## 2011-07-05 DIAGNOSIS — I749 Embolism and thrombosis of unspecified artery: Secondary | ICD-10-CM

## 2011-07-05 DIAGNOSIS — I2510 Atherosclerotic heart disease of native coronary artery without angina pectoris: Secondary | ICD-10-CM

## 2011-07-05 LAB — POCT INR: INR: 2.4

## 2011-07-05 NOTE — Progress Notes (Signed)
Eric Fisher Date of Birth  11/16/1965 [facility] [address]    [address] [place], [place]  [address] [phone]  Fax  [phone]  Present Illness:  45 yo gentleman with a history of dilated coronary arteries with associated thrombus.   He's been treated with Brilinta, Coumadin, and aspirin and has done well. He's having some episodes of chest discomfort he thinks it's more related to his GI tract. He has been found to have gallstones. He is scheduled to have a HIADA  scan soon.  He exercises at cardiac rehabilitation and has not had any episodes of chest pain. At other times he'll develop some nagging upper epigastric or lower chest pain after working hard day at work.    Current Outpatient Prescriptions on File Prior to Visit  Medication Sig Dispense Refill  . ALPRAZolam (XANAX) 0.5 MG tablet Take 0.5 mg by mouth daily as needed. For anxiety       . aspirin 81 MG EC tablet Take 81 mg by mouth daily.        Marland Kitchen atorvastatin (LIPITOR) 80 MG tablet Take 80 mg by mouth at bedtime.       . metoprolol tartrate (LOPRESSOR) 25 MG tablet Take 1 tablet (25 mg total) by mouth 2 (two) times daily.  60 tablet  5  . nitroGLYCERIN (NITROSTAT) 0.4 MG SL tablet Place 0.4 mg under the tongue every 5 (five) minutes as needed.        . RABEprazole (ACIPHEX) 20 MG tablet Take 1 tablet (20 mg total) by mouth daily.  90 tablet  3  . Ticagrelor (BRILINTA) 90 MG TABS tablet Take 90 mg by mouth every 12 (twelve) hours.       Marland Kitchen warfarin (COUMADIN) 5 MG tablet Take 5 mg by mouth at bedtime. 1 pill everyday but Thursday On Thursday take 1.5 pill        No Known Allergies  Past Medical History  Diagnosis Date  . GERD (gastroesophageal reflux disease)   . 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 Hyperglycemia   . Coronary artery disease   . Anxiety   . Myocardial infarction 03/11/2011    Past Surgical History  Procedure Date  . Knee arthroscopy   . Hemorroidectomy    . Transthoracic echocardiogram 03/13/2011    Left ventricle: Mild distal septal hypokinesis The cavity size was normal. Systolic function was normal. The estimated ejection fraction was 55%. Wall motion was normal  . Esophagogastroduodenoscopy 02/2008    small hh  . Bravo ph study 02/2008    Day 1, 155 episodes of acid reflux with longest episode 29 minutes, DeMeester 41.7. Day 2, 51 episodes, DeMeester 12.4. Two of three episodes of chest pain correlated with episode of acid reflux. Study done OFF PPI.  . Cardiac catheterization 03/11/2011    This demonstrated marked ectasia of the proximal LAD with suggestion of a large filling defect consistent  with thrombus.  There was clot extending into the first septal   perforating branch which had slow flow.   . Esophagogastroduodenoscopy 06/19/2011    Procedure: ESOPHAGOGASTRODUODENOSCOPY (EGD);  Surgeon: Eric Ade, MD;  Location: [facility];  Service: Endoscopy;  Laterality: N/A;  11:15    History  Smoking status  . Never Smoker   Smokeless tobacco  . Not on file    History  Alcohol Use No    Family History  Problem Relation Age of Onset  .  Arrhythmia Father   . Colon cancer Neg Hx     Reviw of Systems:  Reviewed in the HPI.  All other systems are negative.  Physical Exam: BP 118/72  Pulse 64  Ht 5\' 11"  (1.803 m)  Wt 179 lb 9.6 oz (81.466 kg)  BMI 25.05 kg/m2 The patient is alert and oriented x 3.  The mood and affect are normal.   Skin: warm and dry.  Color is normal.    HEENT:   the sclera are nonicteric.  The mucous membranes are moist.  The carotids are 2+ without bruits.  There is no thyromegaly.  There is no JVD.    Lungs: clear.  The chest wall is non tender.    Heart: regular rate with a normal S1 and S2.  There are no murmurs, gallops, or rubs. The PMI is not displaced.     Abdomen: good bowel sounds.  There is no guarding or rebound.  There is no hepatosplenomegaly or tenderness.  There are no masses.    Extremities:  no clubbing, cyanosis, or edema.  The legs are without rashes.  The distal pulses are intact.   Neuro:  Cranial nerves II - XII are intact.  Motor and sensory functions are intact.    The gait is normal.   Assessment / Plan:

## 2011-07-05 NOTE — Assessment & Plan Note (Addendum)
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 I've reviewed the "up-to-date" information concerning Kawasaki's disease.  The best management for this seems to be aspirin and Coumadin.  He is doing well from a cardiac standpoint.   He has not had any further episodes of angina.  I'll see him again in 3 months to decide whether to stop his Brilinta.

## 2011-07-05 NOTE — Patient Instructions (Signed)
Call if you have any questions or angina chest pain

## 2011-07-06 ENCOUNTER — Encounter (HOSPITAL_COMMUNITY)
Admission: RE | Admit: 2011-07-06 | Discharge: 2011-07-06 | Disposition: A | Discharge status: Home / Self Care | Payer: BC Managed Care – PPO | Source: Ambulatory Visit | Attending: Cardiovascular Disease | Admitting: Cardiovascular Disease

## 2011-07-09 ENCOUNTER — Encounter (HOSPITAL_COMMUNITY)
Admission: RE | Admit: 2011-07-09 | Discharge: 2011-07-09 | Disposition: A | Discharge status: Home / Self Care | Payer: BC Managed Care – PPO | Source: Ambulatory Visit | Attending: Cardiovascular Disease | Admitting: Cardiovascular Disease

## 2011-07-09 ENCOUNTER — Ambulatory Visit: Payer: BC Managed Care – PPO | Admitting: Gastroenterology

## 2011-07-09 DIAGNOSIS — Z5189 Encounter for other specified aftercare: Secondary | ICD-10-CM | POA: Insufficient documentation

## 2011-07-09 DIAGNOSIS — I252 Old myocardial infarction: Secondary | ICD-10-CM | POA: Insufficient documentation

## 2011-07-09 DIAGNOSIS — I2510 Atherosclerotic heart disease of native coronary artery without angina pectoris: Secondary | ICD-10-CM | POA: Insufficient documentation

## 2011-07-10 ENCOUNTER — Ambulatory Visit (INDEPENDENT_AMBULATORY_CARE_PROVIDER_SITE_OTHER): Payer: BC Managed Care – PPO | Admitting: Gastroenterology

## 2011-07-10 ENCOUNTER — Encounter: Payer: Self-pay | Admitting: Gastroenterology

## 2011-07-10 VITALS — BP 127/77 | HR 87 | Temp 97.2°F | Ht 71.0 in | Wt 181.6 lb

## 2011-07-10 DIAGNOSIS — K8020 Calculus of gallbladder without cholecystitis without obstruction: Secondary | ICD-10-CM | POA: Insufficient documentation

## 2011-07-10 DIAGNOSIS — K824 Cholesterolosis of gallbladder: Secondary | ICD-10-CM | POA: Insufficient documentation

## 2011-07-10 DIAGNOSIS — R1013 Epigastric pain: Secondary | ICD-10-CM

## 2011-07-10 DIAGNOSIS — K219 Gastro-esophageal reflux disease without esophagitis: Secondary | ICD-10-CM

## 2011-07-10 MED ORDER — RABEPRAZOLE SODIUM 20 MG PO TBEC: 20.0000 mg | DELAYED_RELEASE_TABLET | Freq: Two times a day (BID) | ORAL | Status: DC

## 2011-07-10 NOTE — Progress Notes (Signed)
Primary Care Physician: Kari Baars, MD, MD  Primary Gastroenterologist:  Roetta Sessions, MD   Chief Complaint  Patient presents with  . Heartburn    HPI: Eric Fisher is a 45 y.o. male here for further evaluation of recurrent atypical chest pain, epigastric pain. Recent EGD showed a small hiatal hernia. He has a history of gallstones and a gallbladder polyp. His symptoms have not been classical for gallbladder disease and he has not been controlled with on PPI therapy. Previously on Dexilant. After his EGD he was switched to AcipHex 20 mg daily. Initially he felt he was doing better.  Friday, he developed severe 9/10 epigastric pain with radiation into chest. Symptoms began about 45 minutes after eating breakfast. He takes AcipHex every morning half an hour before breakfast. After symptoms began Friday he also took Zantac, then Xanax. He then ate a Frosty and the pain went away. Almost went to ED. He was seen by his PCP who recommended he take Aciphex BID. He also salts cardiologist last week who is not concerned that the pain was cardiac in origin.   Sat/Sunday good. This morning however he is developed some epigastric burning and into chest, acid taste in back of throat. He ate Honey Nut Cherrios and water before attack.  Ate about 5:30am, Aciphex at 5am and symptoms begin at 6:15am.   Never had symptoms during cardiac rehabilitation. Never has associated diaphoresis, shortness of breath.  Physical on Thursday, hemoccults pending.   Current Outpatient Prescriptions  Medication Sig Dispense Refill  . ALPRAZolam (XANAX) 0.5 MG tablet Take 0.5 mg by mouth daily as needed. For anxiety       . aspirin 81 MG EC tablet Take 81 mg by mouth daily.        Marland Kitchen atorvastatin (LIPITOR) 80 MG tablet Take 80 mg by mouth at bedtime.       . metoprolol tartrate (LOPRESSOR) 25 MG tablet Take 1 tablet (25 mg total) by mouth 2 (two) times daily.  60 tablet  5  . nitroGLYCERIN (NITROSTAT) 0.4 MG SL  tablet Place 0.4 mg under the tongue every 5 (five) minutes as needed.        . RABEprazole (ACIPHEX) 20 MG tablet Take 1 tablet (20 mg total) by mouth daily.  90 tablet  3  . Ticagrelor (BRILINTA) 90 MG TABS tablet Take 90 mg by mouth every 12 (twelve) hours.       Marland Kitchen warfarin (COUMADIN) 5 MG tablet Take 5 mg by mouth at bedtime. 1 pill everyday but Sunday On Sunday take 1.5 pill        Allergies as of 07/10/2011  . (No Known Allergies)    ROS:  General: Negative for anorexia, weight loss, fever, chills, fatigue, weakness. ENT: Negative for hoarseness, difficulty swallowing , nasal congestion. CV: Negative for angina, palpitations, dyspnea on exertion, peripheral edema.  Respiratory: Negative for dyspnea at rest, dyspnea on exertion, cough, sputum, wheezing.  GI: See history of present illness. GU:  Negative for dysuria, hematuria, urinary incontinence, urinary frequency, nocturnal urination.  Endo: Negative for unusual weight change.    Physical Examination:   BP 127/77  Pulse 87  Temp(Src) 97.2 F (36.2 C) (Temporal)  Ht 5\' 11"  (1.803 m)  Wt 181 lb 9.6 oz (82.373 kg)  BMI 25.33 kg/m2  General: Well-nourished, well-developed in no acute distress.  Eyes: No icterus. Mouth: Oropharyngeal mucosa moist and pink , no lesions erythema or exudate. Lungs: Clear to auscultation bilaterally.  Heart: Regular rate  and rhythm, no murmurs rubs or gallops.  Abdomen: Bowel sounds are normal, nontender, nondistended, no hepatosplenomegaly or masses, no abdominal bruits or hernia , no rebound or guarding.   Extremities: No lower extremity edema. No clubbing or deformities. Neuro: Alert and oriented x 4   Skin: Warm and dry, no jaundice.   Psych: Alert and cooperative, normal mood and affect.  Labs:  Lab Results  Component Value Date   WBC 6.1 05/12/2011   HGB 14.2 05/12/2011   HCT 42.3 05/12/2011   MCV 87.6 05/12/2011   PLT 174 05/12/2011   Lab Results  Component Value Date   ALT 50  06/04/2011   AST 31 06/04/2011   ALKPHOS 65 06/04/2011   BILITOT 0.7 06/04/2011   Lab Results  Component Value Date   LIPASE 13 05/29/2011   Lab Results  Component Value Date   CREATININE 0.72 05/12/2011   BUN 8 05/12/2011   NA 141 05/12/2011   K 3.7 05/12/2011   CL 104 05/12/2011   CO2 25 05/12/2011     Imaging Studies: abd u/s: 05/2011:   IMPRESSION:  Gallbladder wall polyp, intraluminal sludge and few small mobile  gallstones. No signs of associated cholecystitis.  Non assessed pancreas due to overlying gas.

## 2011-07-10 NOTE — Patient Instructions (Signed)
HIDA scan as scheduled.

## 2011-07-10 NOTE — Progress Notes (Signed)
Cc to PCP 

## 2011-07-10 NOTE — Assessment & Plan Note (Signed)
Epigastric pain with radiation into the chest sometimes related to meals. Known gallstones and gallbladder polyp. He has been on a couple of different PPIs without complete control of the symptoms. He does not feel the pain is related to his heart. Does not have any other associated symptoms and no exertional component although he states the symptoms do seem to be present after prolonged standing and movement at work. Symptoms may be multifactorial. We'll go ahead and pursue HIDA scan at this point. Discussed with the patient that HIDA may not be helpful if it is borderline or normal. He will continue AcipHex 20 mg twice a day. We have provided him with additional samples to supplement his prescription. Further recommendations to follow.

## 2011-07-11 ENCOUNTER — Encounter (HOSPITAL_COMMUNITY)
Admission: RE | Admit: 2011-07-11 | Discharge: 2011-07-11 | Disposition: A | Discharge status: Home / Self Care | Payer: BC Managed Care – PPO | Source: Ambulatory Visit | Attending: Gastroenterology | Admitting: Gastroenterology

## 2011-07-11 ENCOUNTER — Encounter (HOSPITAL_COMMUNITY): Payer: Self-pay

## 2011-07-11 ENCOUNTER — Encounter (HOSPITAL_COMMUNITY): Payer: BC Managed Care – PPO

## 2011-07-11 DIAGNOSIS — K824 Cholesterolosis of gallbladder: Secondary | ICD-10-CM

## 2011-07-11 DIAGNOSIS — R079 Chest pain, unspecified: Secondary | ICD-10-CM | POA: Insufficient documentation

## 2011-07-11 DIAGNOSIS — R109 Unspecified abdominal pain: Secondary | ICD-10-CM | POA: Insufficient documentation

## 2011-07-11 DIAGNOSIS — K219 Gastro-esophageal reflux disease without esophagitis: Secondary | ICD-10-CM

## 2011-07-11 DIAGNOSIS — K8020 Calculus of gallbladder without cholecystitis without obstruction: Secondary | ICD-10-CM | POA: Insufficient documentation

## 2011-07-11 HISTORY — DX: Essential (primary) hypertension: I10

## 2011-07-11 MED ORDER — TECHNETIUM TC 99M MEBROFENIN IV KIT
5.0000 | PACK | Freq: Once | INTRAVENOUS | Status: AC | PRN
  Administered 2011-07-11: 5 via INTRAVENOUS

## 2011-07-11 MED ORDER — SINCALIDE 5 MCG IJ SOLR
INTRAMUSCULAR | Status: AC
  Administered 2011-07-11: 1.65 ug via INTRAVENOUS
  Filled 2011-07-11: qty 5

## 2011-07-11 MED ORDER — SINCALIDE 5 MCG IJ SOLR
0.0200 ug/kg | Freq: Once | INTRAMUSCULAR | Status: AC
  Administered 2011-07-11: 1.65 ug via INTRAVENOUS
  Filled 2011-07-11: qty 5

## 2011-07-12 NOTE — Progress Notes (Signed)
Quick Note:  GB with normal functioning and no reproduction of symptoms. Symptoms not clearly related to gallstones.  I discussed with Dr. Jena Gauss. Continue Aciphex 20mg  po bid. Dr. 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 Begin Elavil 10mg  po at bedtime. #30, 3 refills. May take several weeks to notice benefit.  Dr. Jena Gauss wants to see patient in office as soon as possible. Discuss with Durward Mallard about finding a spot for him.   ______

## 2011-07-13 ENCOUNTER — Telehealth: Payer: Self-pay | Admitting: Internal Medicine

## 2011-07-13 ENCOUNTER — Encounter (HOSPITAL_COMMUNITY)
Admission: RE | Admit: 2011-07-13 | Discharge: 2011-07-13 | Disposition: A | Discharge status: Home / Self Care | Payer: BC Managed Care – PPO | Source: Ambulatory Visit | Attending: Cardiovascular Disease | Admitting: Cardiovascular Disease

## 2011-07-13 NOTE — Telephone Encounter (Signed)
Discussed with LSL 

## 2011-07-13 NOTE — Telephone Encounter (Signed)
See result note from 07/12/11. New events since this telephone note started.

## 2011-07-13 NOTE — Telephone Encounter (Signed)
Pt is asking about his hida scan results. You can reach him on his cell [phone]

## 2011-07-13 NOTE — Telephone Encounter (Signed)
Pt informed of test results.

## 2011-07-13 NOTE — Telephone Encounter (Signed)
This message was sent to Darl Pikes for an appointment and Raynelle Fanning has taking care of everything else.

## 2011-07-16 ENCOUNTER — Encounter: Payer: Self-pay | Admitting: Internal Medicine

## 2011-07-16 ENCOUNTER — Ambulatory Visit (INDEPENDENT_AMBULATORY_CARE_PROVIDER_SITE_OTHER): Payer: BC Managed Care – PPO | Admitting: Internal Medicine

## 2011-07-16 ENCOUNTER — Encounter (HOSPITAL_COMMUNITY)
Admission: RE | Admit: 2011-07-16 | Discharge: 2011-07-16 | Disposition: A | Discharge status: Home / Self Care | Payer: BC Managed Care – PPO | Source: Ambulatory Visit | Attending: Cardiovascular Disease | Admitting: Cardiovascular Disease

## 2011-07-16 VITALS — BP 120/80 | HR 85 | Temp 97.1°F | Ht 71.0 in | Wt 184.0 lb

## 2011-07-16 DIAGNOSIS — R1013 Epigastric pain: Secondary | ICD-10-CM

## 2011-07-16 NOTE — Patient Instructions (Signed)
Continue present medical regimen including AcipHex 20 mg orally twice daily and Elavil 10 mg daily to combat your GI symptoms.   Continue heart healthy die.  Keep a symptom diary.  Office visit with me in 6 weeks.t

## 2011-07-16 NOTE — Progress Notes (Signed)
Primary Care Physician:  Kari Baars, MD, MD Primary Gastroenterologist:  Dr.   Pre-Procedure History & Physical: HPI:  Eric Fisher is a 45 y.o. male here for followup for atypical chest and abdominal pain. There is a postprandial component to his epigastric pain. He has known gallbladder sludge gallstones and a small gallbladder polyp on ultrasound. 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 His PCP recommended that he change his honey Cheerios to another type of cereal in the morning to see if this made a difference.  Indeed, he did switch to raisin bran as of last week. Also he's been on low dose Elavil i.e. 10 mg at bedtime prescribed through this office as of last week. He has not had a significant attacks in a good week  Past Medical History  Diagnosis Date  . GERD (gastroesophageal reflux disease)   . 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 Hyperglycemia   . Coronary artery disease   . Anxiety   . Myocardial infarction 03/11/2011  . 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 Gallstones 05/2011    on abd u/s  . Hypertension     Past Surgical History  Procedure Date  . Knee arthroscopy   . Hemorroidectomy   . Transthoracic echocardiogram 03/13/2011    Left ventricle: Mild distal septal hypokinesis The cavity size was normal. Systolic function was normal. The estimated ejection fraction was 55%. Wall motion was normal  . Esophagogastroduodenoscopy 02/2008    small hh  . Bravo ph study 02/2008    Day 1, 155 episodes of acid reflux with longest episode 29 minutes, DeMeester 41.7. Day 2, 51 episodes, DeMeester 12.4. Two of three episodes of chest pain correlated with episode of acid reflux. Study done OFF PPI.  . Cardiac catheterization 03/11/2011    This demonstrated marked ectasia of the proximal LAD with suggestion of a large filling defect consistent  with thrombus.  There was clot extending into the first septal   perforating branch which had  slow flow.   . Esophagogastroduodenoscopy 06/19/2011    Procedure: ESOPHAGOGASTRODUODENOSCOPY (EGD);  Surgeon: Corbin Ade, MD;  Location: [facility];  Service: Endoscopy;  Laterality: N/A;  11:15, small hh    Prior to Admission medications   Medication Sig Start Date End Date Taking? Authorizing Provider  ALPRAZolam Prudy Feeler) 0.5 MG tablet Take 0.5 mg by mouth daily as needed. For anxiety  05/03/11  Yes Historical Provider, MD  aspirin 81 MG EC tablet Take 81 mg by mouth daily.     Yes Historical Provider, MD  atorvastatin (LIPITOR) 80 MG tablet Take 80 mg by mouth at bedtime.    Yes Historical Provider, MD  metoprolol tartrate (LOPRESSOR) 25 MG tablet Take 1 tablet (25 mg total) by mouth 2 (two) times daily. 04/18/11  Yes [name]., MD  nitroGLYCERIN (NITROSTAT) 0.4 MG SL tablet Place 0.4 mg under the tongue every 5 (five) minutes as needed.     Yes Historical Provider, MD  RABEprazole (ACIPHEX) 20 MG tablet Take 1 tablet (20 mg total) by mouth 2 (two) times daily before a meal. 07/10/11 07/09/12 Yes Tana Coast, PA  Ticagrelor (BRILINTA) 90 MG TABS tablet Take 90 mg by mouth every 12 (twelve) hours.    Yes Historical Provider, MD  warfarin (COUMADIN) 5 MG tablet Take 5 mg by mouth at bedtime. 1 pill everyday but Sunday On Sunday take 0.5 pill  Yes Historical Provider, MD    Allergies as of 07/16/2011  . (No Known Allergies)    Family History  Problem Relation Age of Onset  . Arrhythmia Father   . Colon cancer Neg Hx     History   Social History  . Marital Status: Single    Spouse Name: N/A    Number of Children: 0  . Years of Education: N/A   Occupational History  . maintenance [employer]   Social History Main Topics  . Smoking status: Never Smoker   . Smokeless tobacco: Not on file  . Alcohol Use: No  . Drug Use: No  . Sexually Active: Not on file   Other Topics Concern  . Not on file   Social History Narrative  . No narrative on file    Review of  Systems: See HPI, otherwise negative ROS  Physical Exam: BP 120/80  Pulse 85  Temp(Src) 97.1 F (36.2 C) (Temporal)  Ht 5\' 11"  (1.803 m)  Wt 184 lb (83.462 kg)  BMI 25.66 kg/m2 General:   Alert,  Well-developed, well-nourished, pleasant and cooperative in NAD Head:  Normocephalic and atraumatic. Neck:  Supple; no masses or thyromegaly. Lungs:  Clear throughout to auscultation.   No wheezes, crackles, or rhonchi. No acute distress. Heart:  Regular rate and rhythm; no murmurs, clicks, rubs,  or gallops. Abdomen:  Bowel sounds present  Soft, nontender and nondistended. No masses, hepatosplenomegaly or hernias noted. Normal bowel sounds, without guarding, and without rebound.   Skin:  Intact without significant lesions or rashes. Cervical Nodes:  No significant cervical adenopathy. Psych:  Alert and cooperative. Normal mood and affect.  Impression/Plan:

## 2011-07-16 NOTE — Assessment & Plan Note (Signed)
Patient is somewhat vague epigastric lower retrograde xiphoid symptoms often associated with eating. He is on a very good acid suppression regimen with twice a day AcipHex. He has known gallbladder stones a gallbladder polyp and sludge. He passed his HIDA scan, however.  Although he may have an esophageal motility disorder inside by GERD or other factors, gallbladder symptoms cannot be totally discounted at this time. He was just started on Elavil.   Recommendations: Continue twice a day AcipHex. Continue Elavil 10 mg orally daily. I informed this nice patient it  may take a good 4 weeks before we see maximum efficacy with this agent. He is to keep a symptom diary.  Hold off on esophageal manometry and or impedence study at this time as I feel the yield would be pretty low.  Office visit in 6 weeks.

## 2011-07-18 ENCOUNTER — Encounter (HOSPITAL_COMMUNITY): Payer: BC Managed Care – PPO

## 2011-07-20 ENCOUNTER — Encounter (HOSPITAL_COMMUNITY)
Admission: RE | Admit: 2011-07-20 | Discharge: 2011-07-20 | Disposition: A | Discharge status: Home / Self Care | Payer: BC Managed Care – PPO | Source: Ambulatory Visit | Attending: Cardiovascular Disease | Admitting: Cardiovascular Disease

## 2011-07-23 ENCOUNTER — Telehealth: Payer: Self-pay | Admitting: Internal Medicine

## 2011-07-23 ENCOUNTER — Encounter (HOSPITAL_COMMUNITY)
Admission: RE | Admit: 2011-07-23 | Discharge: 2011-07-23 | Disposition: A | Discharge status: Home / Self Care | Payer: BC Managed Care – PPO | Source: Ambulatory Visit | Attending: Cardiovascular Disease | Admitting: Cardiovascular Disease

## 2011-07-23 NOTE — Telephone Encounter (Signed)
PATIENT IS CALLING TO LET us KNOW HE HAS STILL NOT RECEIVED HIS ACIPHEX FROM [facility]/PLEASE ADVISE??

## 2011-07-23 NOTE — Telephone Encounter (Signed)
Called [facility], they needed a new rx for pt. Gave them rx per lsl writtnen rx.  Copy of rx in media file in chart.

## 2011-07-23 NOTE — Telephone Encounter (Signed)
pts mother aware

## 2011-07-25 ENCOUNTER — Encounter (HOSPITAL_COMMUNITY)
Admission: RE | Admit: 2011-07-25 | Discharge: 2011-07-25 | Disposition: A | Discharge status: Home / Self Care | Payer: BC Managed Care – PPO | Source: Ambulatory Visit | Attending: Cardiovascular Disease | Admitting: Cardiovascular Disease

## 2011-07-27 ENCOUNTER — Encounter (HOSPITAL_COMMUNITY)
Admission: RE | Admit: 2011-07-27 | Discharge: 2011-07-27 | Disposition: A | Discharge status: Home / Self Care | Payer: BC Managed Care – PPO | Source: Ambulatory Visit | Attending: Cardiovascular Disease | Admitting: Cardiovascular Disease

## 2011-07-27 NOTE — Patient Instructions (Signed)
Patient is discharged from Cardiac Rehab at [facility] program today 07/27/11 at his 36 sessions. He achieved Long Term Goal of 30 minutes of aerobic exercise at Max Met level of 3.44mets.  Mr. Covin has progressed nicely to 30 minutes of aerobic exercise at max met level of 3.2 and 10 minutes of strength and flexibility exercises. Mr. Boeke has also lost 7 lbs. All patient's vital signs are within normal limits. Patient has met with the dietician. Discharge instructions has been reviewed in detail and patient expressed understanding. Patient plans to start working out at [employer] before going to work. Cardiac Rehab staff will make follow-up call backs at 1 month, 6 months and 1 year. Patient had no complaints of any abnormal S/S or pain.

## 2011-07-27 NOTE — Progress Notes (Signed)
Cardiac Rehab Progress Report  Orientation: 0712/2012 Graduate Date:  07/27/2011 Discharge Date:  07/27/2011  Cardiologist: Leodis Sias Family MD:  Martha Clan Class Time:  06:45  A.  Exercise Program:  Tolerates exercise @ 3.2 METS for15 minutes and Discharged to home exercise program.  Anticipated compliance:  excellent  B.  Mental Health  Good mental attitude  C.  Education/Instruction/Skills  Knows THR for exercise, Uses Perceived Exertion Scale and/or Dyspnea Scale and Attended all education classes  D.  Nutrition/Weight Control/Body Composition:  Adherence to prescribed nutrition program: good   E.  Blood Lipids    Lab Results  Component Value Date   CHOL 159 03/12/2011     Lab Results  Component Value Date   TRIG 125 03/12/2011     Lab Results  Component Value Date   HDL 41 03/12/2011     Lab Results  Component Value Date   CHOLHDL 3.9 03/12/2011     No results found for this basename: LDLDIRECT      F.  Lifestyle Changes:  Making positive lifestyle changes  G.  Symptoms noted with exercise:  Asymptomatic  Report Completed By:  Angelica Pou  Comments:  Patients Resting  Blood Pressure for today's last session was 110/70 and Resting HR 82, Peak Bp 120/60 Peak HR 109. He has progressed nicely, He plans to join the [employer].

## 2011-07-27 NOTE — Progress Notes (Addendum)
Cardiac Rehab Progress Report  Orientation:  04/19/2011 Graduate Date:  tbd Discharge Date:tbd   Cardiologist:  Leodis Sias Family MD: Martha Clan Class Time:  06:45  A.  Exercise Program:  Tolerates exercise @ 2.5 METS for 15 minutes  B.  Mental Health:  Good mental attitude  C.  Education/Instruction/Skills  Knows THR for exercise and Uses Perceived Exertion Scale and/or Dyspnea Scale  D.  Nutrition/Weight Control/Body Composition:  Adherence to prescribed nutrition program: good   E.  Blood Lipids    Lab Results  Component Value Date   CHOL 159 03/12/2011     Lab Results  Component Value Date   TRIG 125 03/12/2011     Lab Results  Component Value Date   HDL 41 03/12/2011     Lab Results  Component Value Date   CHOLHDL 3.9 03/12/2011     No results found for this basename: LDLDIRECT      F.  Lifestyle Changes:  Making positive lifestyle changes  G.  Symptoms noted with exercise:  Asymptomatic  Report Completed By:  Angelica Pou  Comments:  Patient has done very well during rehab. He achieved a peak mets of 2.5. He is very motivated to continue to exercise.

## 2011-07-27 NOTE — Progress Notes (Signed)
Cardiac Rehab Progress Report  Orientation: 04/19/2011 Graduate Date: tbd Discharge Date tbd  Cardiologist:  Leodis Sias Family MD:  Martha Clan Class Time:  06:45  A.  Exercise Program:  Tolerates exercise @ 3.2 METS for 15 minutes  B.  Mental Health:  Good mental attitude  C.  Education/Instruction/Skills  Knows THR for exercise and Uses Perceived Exertion Scale and/or Dyspnea Scale  D.  Nutrition/Weight Control/Body Composition:  Adherence to prescribed nutrition program: good   E.  Blood Lipids    Lab Results  Component Value Date   CHOL 159 03/12/2011     Lab Results  Component Value Date   TRIG 125 03/12/2011     Lab Results  Component Value Date   HDL 41 03/12/2011     Lab Results  Component Value Date   CHOLHDL 3.9 03/12/2011     No results found for this basename: LDLDIRECT      F.  Lifestyle Changes:  Making positive lifestyle changes  G.  Symptoms noted with exercise:  Asymptomatic  Report Completed By:  Angelica Pou  Comments:  Patient has done very well. He achieved a peak METS of 3.2. He is very motivated and is back at work.

## 2011-07-30 ENCOUNTER — Encounter (HOSPITAL_COMMUNITY): Payer: BC Managed Care – PPO

## 2011-08-01 ENCOUNTER — Encounter (HOSPITAL_COMMUNITY): Payer: BC Managed Care – PPO

## 2011-08-02 ENCOUNTER — Ambulatory Visit (INDEPENDENT_AMBULATORY_CARE_PROVIDER_SITE_OTHER): Payer: BC Managed Care – PPO | Admitting: *Deleted

## 2011-08-02 DIAGNOSIS — I749 Embolism and thrombosis of unspecified artery: Secondary | ICD-10-CM

## 2011-08-03 ENCOUNTER — Encounter (HOSPITAL_COMMUNITY): Payer: BC Managed Care – PPO

## 2011-08-06 ENCOUNTER — Encounter (HOSPITAL_COMMUNITY): Payer: BC Managed Care – PPO

## 2011-08-08 ENCOUNTER — Encounter (HOSPITAL_COMMUNITY): Payer: BC Managed Care – PPO

## 2011-08-10 ENCOUNTER — Encounter (HOSPITAL_COMMUNITY): Payer: BC Managed Care – PPO

## 2011-08-20 ENCOUNTER — Ambulatory Visit: Payer: BC Managed Care – PPO | Admitting: Gastroenterology

## 2011-08-20 ENCOUNTER — Ambulatory Visit: Payer: BC Managed Care – PPO | Admitting: Internal Medicine

## 2011-08-29 ENCOUNTER — Telehealth: Payer: Self-pay | Admitting: Cardiovascular Disease

## 2011-08-29 NOTE — Telephone Encounter (Signed)
New problem [facility] calling about brilinta and wanting to discuss drug therapy Please call

## 2011-08-29 NOTE — Telephone Encounter (Signed)
Pt to be on both coumadin and brillinta

## 2011-08-31 ENCOUNTER — Ambulatory Visit (INDEPENDENT_AMBULATORY_CARE_PROVIDER_SITE_OTHER): Payer: BC Managed Care – PPO | Admitting: *Deleted

## 2011-08-31 DIAGNOSIS — I749 Embolism and thrombosis of unspecified artery: Secondary | ICD-10-CM

## 2011-08-31 LAB — POCT INR: INR: 1.9

## 2011-09-03 ENCOUNTER — Telehealth: Payer: Self-pay | Admitting: Cardiovascular Disease

## 2011-09-03 NOTE — Telephone Encounter (Signed)
New message:  Pt needs samples of Brillinta if possible until his mail order comes in, this week.  If no samples, please call in 1 month supply called into [facility] [place].  Please call patient and advise if samples are available or when pres is called in.

## 2011-09-03 NOTE — Telephone Encounter (Signed)
Pt called samples available.

## 2011-09-06 NOTE — Progress Notes (Signed)
REVIEWED.  

## 2011-09-17 ENCOUNTER — Telehealth: Payer: Self-pay

## 2011-09-17 NOTE — Telephone Encounter (Signed)
Pt called requesting samples of aciphex to last until his appt with [facility] in January. Gave #[phone]

## 2011-09-17 NOTE — Telephone Encounter (Signed)
Noted  

## 2011-09-24 ENCOUNTER — Telehealth: Payer: Self-pay | Admitting: Cardiovascular Disease

## 2011-09-24 ENCOUNTER — Encounter: Payer: Self-pay | Admitting: Cardiovascular Disease

## 2011-09-24 ENCOUNTER — Ambulatory Visit (INDEPENDENT_AMBULATORY_CARE_PROVIDER_SITE_OTHER): Payer: BC Managed Care – PPO | Admitting: *Deleted

## 2011-09-24 ENCOUNTER — Ambulatory Visit (INDEPENDENT_AMBULATORY_CARE_PROVIDER_SITE_OTHER): Payer: BC Managed Care – PPO | Admitting: Cardiovascular Disease

## 2011-09-24 DIAGNOSIS — R109 Unspecified abdominal pain: Secondary | ICD-10-CM

## 2011-09-24 DIAGNOSIS — I2510 Atherosclerotic heart disease of native coronary artery without angina pectoris: Secondary | ICD-10-CM

## 2011-09-24 DIAGNOSIS — I749 Embolism and thrombosis of unspecified artery: Secondary | ICD-10-CM

## 2011-09-24 LAB — POCT INR: INR: 1.7

## 2011-09-24 MED ORDER — TICAGRELOR 90 MG PO TABS: 90.0000 mg | ORAL_TABLET | Freq: Two times a day (BID) | ORAL | Status: DC

## 2011-09-24 MED ORDER — ATORVASTATIN CALCIUM 80 MG PO TABS: 80.0000 mg | ORAL_TABLET | Freq: Every day | ORAL | Status: DC

## 2011-09-24 MED ORDER — WARFARIN SODIUM 5 MG PO TABS: ORAL_TABLET | ORAL | Status: DC

## 2011-09-24 NOTE — Assessment & Plan Note (Signed)
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 I've reviewed the "up-to-date" information concerning Kawasaki's disease.  The best management for this seems to be aspirin and Coumadin.  He is doing well from a cardiac standpoint.   He has not had any further episodes of angina. He is exercising on a regular basis and has not had any episodes of angina. I think her best course of action would be to continue the Brilinta and discontinue the aspirin. We will continue Coumadin.

## 2011-09-24 NOTE — Patient Instructions (Signed)
Your physician wants you to follow-up in: 6 months You will receive a reminder letter in the mail two months in advance. If you don't receive a letter, please call our office to schedule the follow-up appointment.   Your physician has recommended you make the following change in your medication:   STOP aspirin   Your physician recommends that you return for a FASTING lipid profile: 6 months

## 2011-09-24 NOTE — Telephone Encounter (Signed)
New msg Pt was just here for office visit Pt forgot to tell you- he needs warfarin sent [facility] also

## 2011-09-24 NOTE — Progress Notes (Signed)
Eric Fisher Date of Birth  January 04, 1966 [facility] [address]    [address] [place], [place]  [address] [phone]  Fax  [phone]  History of Present Illness:  Eric Fisher is a 45 year old gentleman with a history of coronary artery disease. 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 We treated with Brilinta, aspirin, and Coumadin. He's had some atypical episodes of chest pain  last saw him. None of his symptoms were similar to his presenting episodes of angina.  He exercises on a daily basis and does not have any episodes of chest pain.  Current Outpatient Prescriptions on File Prior to Visit  Medication Sig Dispense Refill  . ALPRAZolam (XANAX) 0.5 MG tablet Take 0.5 mg by mouth daily as needed. For anxiety       . aspirin 81 MG EC tablet Take 81 mg by mouth daily.        Marland Kitchen atorvastatin (LIPITOR) 80 MG tablet Take 80 mg by mouth at bedtime.       . metoprolol tartrate (LOPRESSOR) 25 MG tablet Take 1 tablet (25 mg total) by mouth 2 (two) times daily.  60 tablet  5  . nitroGLYCERIN (NITROSTAT) 0.4 MG SL tablet Place 0.4 mg under the tongue every 5 (five) minutes as needed.        . RABEprazole (ACIPHEX) 20 MG tablet Take 1 tablet (20 mg total) by mouth 2 (two) times daily before a meal.  20 tablet  0  . Ticagrelor (BRILINTA) 90 MG TABS tablet Take 90 mg by mouth every 12 (twelve) hours.       Marland Kitchen warfarin (COUMADIN) 5 MG tablet Take 5 mg by mouth at bedtime. 1 pill everyday but Sunday On Sunday take 0.5 pill        No Known Allergies  Past Medical History  Diagnosis Date  . GERD (gastroesophageal reflux disease)   . 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 Hyperglycemia   . Coronary artery disease   . Anxiety   . Myocardial infarction 03/11/2011  . 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 Gallstones 05/2011    on abd u/s  . Hypertension     Past Surgical History  Procedure Date  . Knee arthroscopy     . Hemorroidectomy   . Transthoracic echocardiogram 03/13/2011    Left ventricle: Mild distal septal hypokinesis The cavity size was normal. Systolic function was normal. The estimated ejection fraction was 55%. Wall motion was normal  . Esophagogastroduodenoscopy 02/2008    small hh  . Bravo ph study 02/2008    Day 1, 155 episodes of acid reflux with longest episode 29 minutes, DeMeester 41.7. Day 2, 51 episodes, DeMeester 12.4. Two of three episodes of chest pain correlated with episode of acid reflux. Study done OFF PPI.  . Cardiac catheterization 03/11/2011    This demonstrated marked ectasia of the proximal LAD with suggestion of a large filling defect consistent  with thrombus.  There was clot extending into the first septal   perforating branch which had slow flow.   . Esophagogastroduodenoscopy 06/19/2011    Procedure: ESOPHAGOGASTRODUODENOSCOPY (EGD);  Surgeon: Corbin Ade, MD;  Location: [facility];  Service: Endoscopy;  Laterality: N/A;  11:15, small hh    History  Smoking status  . Never Smoker   Smokeless tobacco  . Not on file    History  Alcohol Use No  Family History  Problem Relation Age of Onset  . Arrhythmia Father   . Colon cancer Neg Hx     Reviw of Systems:  Reviewed in the HPI.  All other systems are negative.  Physical Exam: BP 129/85  Pulse 78  Ht 5\' 11"  (1.803 m)  Wt 187 lb (84.823 kg)  BMI 26.08 kg/m2 The patient is alert and oriented x 3.  The mood and affect are normal.   Skin: warm and dry.  Color is normal.    HEENT:   Normocephalic/atraumatic. His carotids are 2+ without bruits. There is no JVD.  Lungs: Lungs are clear.   Heart: Regular S1-S2. His murmurs.    Abdomen: His abdominal exam is benign. He has good bowel sounds. There is no hepatosplenomegaly. There is no  Tenderness.   Extremities:  No clubbing cyanosis or edema.  Neuro:  Exam is nonfocal.  Gait is normal to    ECG:   Assessment / Plan:

## 2011-10-12 ENCOUNTER — Encounter: Payer: Self-pay | Admitting: Internal Medicine

## 2011-10-12 ENCOUNTER — Ambulatory Visit: Payer: BC Managed Care – PPO | Admitting: Internal Medicine

## 2011-10-12 ENCOUNTER — Ambulatory Visit (INDEPENDENT_AMBULATORY_CARE_PROVIDER_SITE_OTHER): Payer: BC Managed Care – PPO | Admitting: Internal Medicine

## 2011-10-12 VITALS — BP 130/85 | HR 71 | Temp 97.5°F | Ht 71.0 in | Wt 187.8 lb

## 2011-10-12 DIAGNOSIS — K219 Gastro-esophageal reflux disease without esophagitis: Secondary | ICD-10-CM

## 2011-10-12 NOTE — Progress Notes (Signed)
Primary Care Physician:  Kari Baars, MD, MD Primary Gastroenterologist:  Dr. Jena Gauss  Pre-Procedure History & Physical: HPI:  Eric Fisher is a 46 y.o. male here for followup of GERD and atypical chest pain. History of coronary disease (dilated coronaries with acute coronary syndrome). Reflux symptoms have settled down nicely on AcipHex 20 mg orally twice daily. Also low-dose amitriptyline has pretty much abolished his chest pain. He has had some fleeting left lateral rib cage pain from time to time it may last a few seconds to several minutes. Without aggravating factors. In association with bowel movement no abdominal pain eating or fasting does not make a difference. He notes it more when he increased workout for pancreas work on his car. Has not seen Dr. Clelia Croft recently  Past Medical History  Diagnosis Date  . GERD (gastroesophageal reflux disease)   . 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 Hyperglycemia   . Coronary artery disease   . Anxiety   . Myocardial infarction 03/11/2011  . 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 Gallstones 05/2011    on abd u/s  . Hypertension     Past Surgical History  Procedure Date  . Knee arthroscopy   . Hemorroidectomy   . Transthoracic echocardiogram 03/13/2011    Left ventricle: Mild distal septal hypokinesis The cavity size was normal. Systolic function was normal. The estimated ejection fraction was 55%. Wall motion was normal  . Esophagogastroduodenoscopy 02/2008    small hh  . Bravo ph study 02/2008    Day 1, 155 episodes of acid reflux with longest episode 29 minutes, DeMeester 41.7. Day 2, 51 episodes, DeMeester 12.4. Two of three episodes of chest pain correlated with episode of acid reflux. Study done OFF PPI.  . Cardiac catheterization 03/11/2011    This demonstrated marked ectasia of the proximal LAD with suggestion of a large filling defect consistent  with thrombus.  There was clot extending into the first  septal   perforating branch which had slow flow.   . Esophagogastroduodenoscopy 06/19/2011    Procedure: ESOPHAGOGASTRODUODENOSCOPY (EGD);  Surgeon: Corbin Ade, MD;  Location: [facility];  Service: Endoscopy;  Laterality: N/A;  11:15, small hh    Prior to Admission medications   Medication Sig Start Date End Date Taking? Authorizing Provider  ALPRAZolam Prudy Feeler) 0.5 MG tablet Take 0.5 mg by mouth daily as needed. For anxiety  05/03/11  Yes Historical Provider, MD  amitriptyline (ELAVIL) 10 MG tablet Take 10 mg by mouth at bedtime.  09/08/11  Yes Historical Provider, MD  atorvastatin (LIPITOR) 80 MG tablet Take 1 tablet (80 mg total) by mouth at bedtime. 09/24/11  Yes [name]., MD  metoprolol tartrate (LOPRESSOR) 25 MG tablet Take 1 tablet (25 mg total) by mouth 2 (two) times daily. 04/18/11  Yes [name]., MD  nitroGLYCERIN (NITROSTAT) 0.4 MG SL tablet Place 0.4 mg under the tongue every 5 (five) minutes as needed.     Yes Historical Provider, MD  Ticagrelor (BRILINTA) 90 MG TABS tablet Take 1 tablet (90 mg total) by mouth every 12 (twelve) hours. 09/24/11  Yes [name]., MD  warfarin (COUMADIN) 5 MG tablet Take as directed by the Anticoagulation Clinic 09/24/11  Yes [name]., MD  RABEprazole (ACIPHEX) 20 MG tablet Take 1 tablet (20 mg total) by mouth 2 (two) times daily before a meal. 07/10/11 07/09/12  Tana Coast, PA  Allergies as of 10/12/2011  . (No Known Allergies)    Family History  Problem Relation Age of Onset  . Arrhythmia Father   . Colon cancer Neg Hx     History   Social History  . Marital Status: Single    Spouse Name: N/A    Number of Children: 0  . Years of Education: N/A   Occupational History  . maintenance [employer]   Social History Main Topics  . Smoking status: Never Smoker   . Smokeless tobacco: Not on file  . Alcohol Use: No  . Drug Use: No  . Sexually Active: Not on file   Other Topics Concern  . Not on file     Social History Narrative  . No narrative on file    Review of Systems: See HPI, otherwise negative ROS  Physical Exam: BP 130/85  Pulse 71  Temp(Src) 97.5 F (36.4 C) (Temporal)  Ht 5\' 11"  (1.803 m)  Wt 187 lb 12.8 oz (85.186 kg)  BMI 26.19 kg/m2 General:   Alert,  Well-developed, well-nourished, pleasant and cooperative in NAD Skin:  Intact without significant lesions or rashes. Eyes:  Sclera clear, no icterus.   Conjunctiva pink. Ears:  Normal auditory acuity. Nose:  No deformity, discharge,  or lesions. Mouth:  No deformity or lesions. Neck:  Supple; no masses or thyromegaly. No significant cervical adenopathy. Lungs:  No chest wall tenderness. Clear throughout to auscultation.   No wheezes, crackles, or rhonchi. No acute distress. Heart:  Regular rate and rhythm; no murmurs, clicks, rubs,  or gallops. Abdomen: Non-distended, normal bowel sounds.  Soft and nontender without appreciable mass or hepatosplenomegaly.  Pulses:  Normal pulses noted. Extremities:  Without clubbing or edema.  Impression/Plan:

## 2011-10-12 NOTE — Patient Instructions (Addendum)
aciphex 20 mg daily once daily  Continue Elavil 10 mg daily  Ov 6 months   Repeat ultrasound of gallbladder in 2014 to check polyp

## 2011-10-12 NOTE — Assessment & Plan Note (Signed)
From a GI standpoint, he is doing very well. I feel we can drop back on the AcipHex to 20 mg once daily. He has derived a good bit of benefit from low-dose amitriptyline. He is tolerating this medication well without any side effects.  Left-sided chest pain likely musculoskeletal or possibly radicular in origin. I doubt this is visceral  GI pathology. He does have a gallbladder polyp which will need to periodically checked (separate issue).   Recommendations: Decrease AcipHex to 20 mg orally once daily. Continue amitriptyline at a dose of 10 mg daily.  Office visit in 6 months. A followup gallbladder ultrasound 2014. I told this nice patient if his left-sided chest wall pain does not improve, he should followup with his primary care physician.

## 2011-10-15 ENCOUNTER — Other Ambulatory Visit: Payer: Self-pay

## 2011-10-15 ENCOUNTER — Other Ambulatory Visit: Payer: Self-pay | Admitting: *Deleted

## 2011-10-15 ENCOUNTER — Ambulatory Visit (INDEPENDENT_AMBULATORY_CARE_PROVIDER_SITE_OTHER): Payer: BC Managed Care – PPO | Admitting: *Deleted

## 2011-10-15 DIAGNOSIS — I749 Embolism and thrombosis of unspecified artery: Secondary | ICD-10-CM

## 2011-10-15 DIAGNOSIS — R109 Unspecified abdominal pain: Secondary | ICD-10-CM

## 2011-10-15 LAB — POCT INR: INR: 2.2

## 2011-10-15 MED ORDER — AMITRIPTYLINE HCL 10 MG PO TABS: 10.0000 mg | ORAL_TABLET | Freq: Every day | ORAL | Status: DC

## 2011-10-15 MED ORDER — METOPROLOL TARTRATE 25 MG PO TABS: 25.0000 mg | ORAL_TABLET | Freq: Two times a day (BID) | ORAL | Status: DC

## 2011-10-15 MED ORDER — RABEPRAZOLE SODIUM 20 MG PO TBEC: 20.0000 mg | DELAYED_RELEASE_TABLET | Freq: Every day | ORAL | Status: DC

## 2011-10-15 NOTE — Telephone Encounter (Signed)
Refilled med

## 2011-10-17 ENCOUNTER — Encounter: Payer: Self-pay | Admitting: Cardiovascular Disease

## 2011-10-24 ENCOUNTER — Telehealth: Payer: Self-pay

## 2011-10-24 DIAGNOSIS — R109 Unspecified abdominal pain: Secondary | ICD-10-CM

## 2011-10-24 NOTE — Telephone Encounter (Signed)
Pt said that he usually gets Amitriptyline and Aciphex fro 90 days with 3 refills. This time he got 30 day supply and 5 refills. He would like to get it changed if possible. I told him that Raynelle Fanning will be back tomorrow and I will have her check on it.

## 2011-10-25 MED ORDER — AMITRIPTYLINE HCL 10 MG PO TABS: 10.0000 mg | ORAL_TABLET | Freq: Every day | ORAL | Status: DC

## 2011-10-25 MED ORDER — RABEPRAZOLE SODIUM 20 MG PO TBEC: 20.0000 mg | DELAYED_RELEASE_TABLET | Freq: Every day | ORAL | Status: DC

## 2011-10-25 NOTE — Telephone Encounter (Signed)
Routed to refill box 

## 2011-10-25 NOTE — Telephone Encounter (Signed)
Please let pt know, rx sent to [facility] for aciphex and amitriptyline for 90 day with 3 refills.

## 2011-10-25 NOTE — Telephone Encounter (Signed)
pts father aware

## 2011-11-12 ENCOUNTER — Ambulatory Visit (INDEPENDENT_AMBULATORY_CARE_PROVIDER_SITE_OTHER): Payer: BC Managed Care – PPO | Admitting: *Deleted

## 2011-11-12 DIAGNOSIS — I749 Embolism and thrombosis of unspecified artery: Secondary | ICD-10-CM

## 2011-12-10 ENCOUNTER — Ambulatory Visit (INDEPENDENT_AMBULATORY_CARE_PROVIDER_SITE_OTHER): Payer: BC Managed Care – PPO | Admitting: *Deleted

## 2011-12-10 DIAGNOSIS — I749 Embolism and thrombosis of unspecified artery: Secondary | ICD-10-CM

## 2011-12-19 ENCOUNTER — Ambulatory Visit: Payer: Self-pay | Admitting: Cardiology

## 2011-12-19 ENCOUNTER — Telehealth: Payer: Self-pay | Admitting: Cardiovascular Disease

## 2011-12-19 DIAGNOSIS — I749 Embolism and thrombosis of unspecified artery: Secondary | ICD-10-CM

## 2011-12-19 NOTE — Telephone Encounter (Signed)
Pt states he will follow up with his PCP (Dr Clelia Croft) for his coumadin checks, wants to Cx appt with [place] due to co-pay cost. He states he does have enough pills to last him to his appt with Dr Clelia Croft on 01/10/12.

## 2011-12-19 NOTE — Telephone Encounter (Signed)
New Problem:    Patient called in because he was concerned about his coumadin bottle having zero refills written on it.  Please call back.

## 2012-03-26 ENCOUNTER — Encounter: Payer: Self-pay | Admitting: Cardiovascular Disease

## 2012-03-26 ENCOUNTER — Ambulatory Visit (INDEPENDENT_AMBULATORY_CARE_PROVIDER_SITE_OTHER): Payer: BC Managed Care – PPO | Admitting: Cardiovascular Disease

## 2012-03-26 VITALS — BP 134/91 | HR 75 | Ht 71.0 in | Wt 191.0 lb

## 2012-03-26 DIAGNOSIS — E785 Hyperlipidemia, unspecified: Secondary | ICD-10-CM

## 2012-03-26 DIAGNOSIS — I2510 Atherosclerotic heart disease of native coronary artery without angina pectoris: Secondary | ICD-10-CM

## 2012-03-26 LAB — LIPID PANEL
HDL: 39.8 mg/dL (ref >39.00)
Total CHOL/HDL Ratio: 2

## 2012-03-26 LAB — BASIC METABOLIC PANEL
BUN: 11 mg/dL (ref 6–23)
CO2: 28 mEq/L (ref 19–32)
Calcium: 9.1 mg/dL (ref 8.4–10.5)
Creatinine, Ser: 0.9 mg/dL (ref 0.4–1.5)
GFR: 104.17 mL/min (ref >60.00)
Glucose, Bld: 86 mg/dL (ref 70–99)
Sodium: 140 mEq/L (ref 135–145)

## 2012-03-26 LAB — HEPATIC FUNCTION PANEL
ALT: 53 U/L (ref 0–53)
AST: 36 U/L (ref 0–37)
Alkaline Phosphatase: 52 U/L (ref 39–117)
Bilirubin, Direct: 0.1 mg/dL (ref 0.0–0.3)
Total Bilirubin: 0.9 mg/dL (ref 0.3–1.2)

## 2012-03-26 NOTE — Assessment & Plan Note (Signed)
Eric Fisher is doing well. He's not had any episodes of chest pain. He has extremely large and ectatic coronary arteries.  He has no  history of Kawasaki's disease but this seems to be something similar. 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  We have decided to treat him long-term with prolonged Brilinta and  Coumadin. I'll see him back in the office in 6 months for an office visit, EKG, and fasting lipids, basic metabolic profile, and liver profile. We'll check fasting labs also today.

## 2012-03-26 NOTE — Patient Instructions (Addendum)
Your physician wants you to follow-up in: 6 MONTHS  You will receive a reminder letter in the mail two months in advance. If you don't receive a letter, please call our office to schedule the follow-up appointment.  Your physician recommends that you return for a FASTING lipid profile: TODAY AND IN 6 MONTHS  

## 2012-03-26 NOTE — Progress Notes (Signed)
Eric Fisher Date of Birth  [birth year]       [facility]    [place] [address], [address]  [address] [place], [place]  [address]   [place], [place]  [address] [phone]     [phone]   Fax  [phone]    Fax [phone]  Problem List: 1. 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 Hyperlipidemia   History of Present Illness: Eric Fisher is a 46 year old gentleman with a history of coronary artery disease. 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 We treated with Brilinta, aspirin, and Coumadin. He's had some atypical episodes of chest pain since last saw him. None of his symptoms were similar to his presenting episodes of angina. He exercises on a daily basis and does not have any episodes of chest pain.  He has been maintained on Brilinta and Coumadin he seems to be doing fairly well. He is quite active and has not had any recurrent episodes of angina.    Current Outpatient Prescriptions on File Prior to Visit  Medication Sig Dispense Refill  . ALPRAZolam (XANAX) 0.5 MG tablet Take 0.5 mg by mouth daily as needed. For anxiety       . amitriptyline (ELAVIL) 10 MG tablet Take 1 tablet (10 mg total) by mouth at bedtime.  90 tablet  3  . atorvastatin (LIPITOR) 80 MG tablet Take 1 tablet (80 mg total) by mouth at bedtime.  90 tablet  3  . metoprolol tartrate (LOPRESSOR) 25 MG tablet Take 1 tablet (25 mg total) by mouth 2 (two) times daily.  180 tablet  3  . nitroGLYCERIN (NITROSTAT) 0.4 MG SL tablet Place 0.4 mg under the tongue every 5 (five) minutes as needed.        . RABEprazole (ACIPHEX) 20 MG tablet Take 1 tablet (20 mg total) by mouth daily.  90 tablet  3  . Ticagrelor (BRILINTA) 90 MG TABS tablet Take 1 tablet (90 mg total) by mouth every 12 (twelve) hours.  180 tablet  3  . warfarin (COUMADIN) 5 MG tablet Take as directed by the Anticoagulation  Clinic  110 tablet  0    No Known Allergies  Past Medical History  Diagnosis Date  . GERD (gastroesophageal reflux disease)   . 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 Hyperglycemia   . Coronary artery disease   . Anxiety   . Myocardial infarction 03/11/2011  . 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 Gallstones 05/2011    on abd u/s  . Hypertension     Past Surgical History  Procedure Date  . Knee arthroscopy   . Hemorroidectomy   . Transthoracic echocardiogram 03/13/2011    Left ventricle: Mild distal septal hypokinesis The cavity size was normal. Systolic function was normal. The estimated ejection fraction was 55%. Wall motion was normal  . Esophagogastroduodenoscopy 02/2008    small hh  . Bravo ph study 02/2008    Day 1, 155 episodes of acid reflux with longest episode 29 minutes, DeMeester 41.7. Day 2, 51 episodes, DeMeester 12.4. Two of three episodes of chest pain correlated with episode of acid reflux. Study done OFF PPI.  . Cardiac catheterization 03/11/2011    This demonstrated marked ectasia of the proximal LAD with suggestion of a large filling defect consistent  with thrombus.  There was clot extending into  the first septal   perforating branch which had slow flow.   . Esophagogastroduodenoscopy 06/19/2011    Procedure: ESOPHAGOGASTRODUODENOSCOPY (EGD);  Surgeon: Corbin Ade, MD;  Location: [facility];  Service: Endoscopy;  Laterality: N/A;  11:15, small hh    History  Smoking status  . Never Smoker   Smokeless tobacco  . Not on file    History  Alcohol Use No    Family History  Problem Relation Age of Onset  . Arrhythmia Father   . Colon cancer Neg Hx     Reviw of Systems:  Reviewed in the HPI.  All other systems are negative.  Physical Exam: Blood pressure 134/91, pulse 75, height 5\' 11"  (1.803 m), weight 191 lb (86.637 kg). General: Well developed, well nourished, in no acute distress.  Head: Normocephalic,  atraumatic, sclera non-icteric, mucus membranes are moist,   Neck: Supple. Carotids are 2 + without bruits. No JVD  Lungs: Clear bilaterally to auscultation.  Heart: regular rate.  normal  S1 S2. No murmurs, gallops or rubs.  Abdomen: Soft, non-tender, non-distended with normal bowel sounds. No hepatomegaly. No rebound/guarding. No masses.  Msk:  Strength and tone are normal  Extremities: No clubbing or cyanosis. No edema.  Distal pedal pulses are 2+ and equal bilaterally.  Neuro: Alert and oriented X 3. Moves all extremities spontaneously.  Psych:  Responds to questions appropriately with a normal affect.  ECG:  Assessment / Plan:

## 2012-04-08 ENCOUNTER — Encounter: Payer: Self-pay | Admitting: Internal Medicine

## 2012-05-13 ENCOUNTER — Ambulatory Visit (INDEPENDENT_AMBULATORY_CARE_PROVIDER_SITE_OTHER): Payer: BC Managed Care – PPO | Admitting: Internal Medicine

## 2012-05-13 ENCOUNTER — Encounter: Payer: Self-pay | Admitting: Internal Medicine

## 2012-05-13 VITALS — BP 142/84 | HR 102 | Temp 98.5°F | Ht 71.0 in | Wt 187.4 lb

## 2012-05-13 DIAGNOSIS — K645 Perianal venous thrombosis: Secondary | ICD-10-CM

## 2012-05-13 NOTE — Progress Notes (Signed)
Primary Care Physician:  Kari Baars, MD Primary Gastroenterologist:  Dr.   Pre-Procedure History & Physical: HPI:  Eric Fisher is a 46 y.o. male here for evaluation of a one-week history of rectal/anal pain. Feels a protrusion of through his anus when he has a bowel movement. Denies constipation /diarrhea. Does not have any rectal bleeding. Continues to be on warfarin and Brilinta.  Status post flexible sigmoidoscopy and hemorrhoidectomy for somewhat similar symptoms 10 years ago. Dr. Lovell Sheehan performed surgery. His upper GI tract symptoms (GERD and chest pain) quiescent on AcipHex and low-dose Elavil. History of gallbladder polyp for which he is due a surveillance ultrasound in 2014.  Past Medical History  Diagnosis Date  . GERD (gastroesophageal reflux disease)   . 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 Hyperglycemia   . Coronary artery disease   . Anxiety   . Myocardial infarction 03/11/2011  . 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 Gallstones 05/2011    on abd u/s  . Hypertension     Past Surgical History  Procedure Date  . Knee arthroscopy   . Hemorroidectomy   . Transthoracic echocardiogram 03/13/2011    Left ventricle: Mild distal septal hypokinesis The cavity size was normal. Systolic function was normal. The estimated ejection fraction was 55%. Wall motion was normal  . Esophagogastroduodenoscopy 02/2008    small hh  . Bravo ph study 02/2008    Day 1, 155 episodes of acid reflux with longest episode 29 minutes, [place] 41.7. Day 2, 51 episodes, [place] 12.4. Two of three episodes of chest pain correlated with episode of acid reflux. Study done OFF PPI.  . Cardiac catheterization 03/11/2011    This demonstrated marked ectasia of the proximal LAD with suggestion of a large filling defect consistent  with thrombus.  There was clot extending into the first septal   perforating branch which had slow flow.   . Esophagogastroduodenoscopy 06/19/2011      Dr. Elmer Ramp esophagus, small hiatal hernia o/w normal stomach    Prior to Admission medications   Medication Sig Start Date End Date Taking? Authorizing Provider  ALPRAZolam Prudy Feeler) 0.5 MG tablet Take 0.5 mg by mouth daily as needed. For anxiety  05/03/11  Yes Historical Provider, MD  amitriptyline (ELAVIL) 10 MG tablet Take 1 tablet (10 mg total) by mouth at bedtime. 10/25/11  Yes Tiffany Kocher, PA  atorvastatin (LIPITOR) 80 MG tablet Take 1 tablet (80 mg total) by mouth at bedtime. 09/24/11  Yes [name], MD  metoprolol tartrate (LOPRESSOR) 25 MG tablet Take 1 tablet (25 mg total) by mouth 2 (two) times daily. 10/15/11  Yes [name], MD  nitroGLYCERIN (NITROSTAT) 0.4 MG SL tablet Place 0.4 mg under the tongue every 5 (five) minutes as needed.     Yes Historical Provider, MD  RABEprazole (ACIPHEX) 20 MG tablet Take 1 tablet (20 mg total) by mouth daily. 10/25/11 10/24/12 Yes Tiffany Kocher, PA  Ticagrelor (BRILINTA) 90 MG TABS tablet Take 1 tablet (90 mg total) by mouth every 12 (twelve) hours. 09/24/11  Yes [name], MD  warfarin (COUMADIN) 5 MG tablet Take 5 mg by mouth daily. Monday and Thursday does 7.5 mg 09/24/11  Yes [name], MD    Allergies as of 05/13/2012  . (No Known Allergies)    Family History  Problem Relation Age of Onset  . Arrhythmia Father   . Colon cancer  Neg Hx     History   Social History  . Marital Status: Single    Spouse Name: N/A    Number of Children: 0  . Years of Education: N/A   Occupational History  . maintenance [employer]   Social History Main Topics  . Smoking status: Never Smoker   . Smokeless tobacco: Not on file  . Alcohol Use: No  . Drug Use: No  . Sexually Active: Not on file   Other Topics Concern  . Not on file   Social History Narrative  . No narrative on file    Review of Systems: See HPI, otherwise negative ROS  Physical Exam: BP 142/84  Pulse 102  Temp 98.5 F (36.9 C) (Temporal)   Ht 5\' 11"  (1.803 m)  Wt 187 lb 6.4 oz (85.004 kg)  BMI 26.14 kg/m2 General:   Alert,  Well-developed, well-nourished, pleasant and cooperative in NAD Skin:  Intact without significant lesions or rashes. Eyes:  Sclera clear, no icterus.   Conjunctiva pink. Ears:  Normal auditory acuity. Nose:  No deformity, discharge,  or lesions. Mouth:  No deformity or lesions. Neck:  Supple; no masses or thyromegaly. No significant cervical adenopathy. Lungs:  Clear throughout to auscultation.   No wheezes, crackles, or rhonchi. No acute distress. Heart:  Regular rate and rhythm; no murmurs, clicks, rubs,  or gallops. Abdomen: Non-distended, normal bowel sounds.  Soft and nontender without appreciable mass or hepatosplenomegaly.  Pulses:  Normal pulses noted. Extremities:  Without clubbing or edema.  Impression/Plan:  46 year old gentleman with a one-week history of anorectal pain. It appears he has a thrombosed external hemorrhoid. I discussed case with Dr. Lovell Sheehan who performed hemorrhoidectomy on him some 10 years ago. Dr. Lovell Sheehan will see him in the office this coming Thursday at 9:30 AM. Ongoing anticoagulation antithrombotic therapy will likely complicate his management to some degree. In the interim, Ana -mantle Forte cream to the affected area 4 times a day. Sitz baths, etc.

## 2012-05-13 NOTE — Patient Instructions (Addendum)
Will make arrangements for you to see Dr. Lovell Sheehan ASAP.  I have discussed the case with Dr. Lovell Sheehan.  AnaMantle forte cream to affected area at the anus.  Sitz baths  See Dr. Lovell Sheehan in his office this coming Thursday at 9:30 AM.  Information on thrombosed hemorrhoids provided to the patient.

## 2012-05-14 NOTE — Progress Notes (Unsigned)
Darl Pikes, please nic  Per RMR- Patient needs a gallbladder ultrasound in mid 2014 to follow up on previously noted polyp.

## 2012-05-15 ENCOUNTER — Telehealth: Payer: Self-pay | Admitting: *Deleted

## 2012-05-15 NOTE — H&P (Signed)
  NTS SOAP Note  Vital Signs:  Vitals as of: 05/15/2012: Systolic 127: Diastolic 85: Heart Rate 99: Temp 99.3F: Height 5ft 11in: Weight 189Lbs 0 Ounces: Pain Level 5: BMI 26  BMI : 26.36 kg/m2  Subjective: This 46 Years 11 Months old Male presents for of    HEMORRHOIDS: ,Has been having severe rectal pain due to a thrombosed hemorrhoid over the past week.  Started having some rectal bleeding this am.  Creams have not been helpful.  Is anticoagulated due to abnormality of cardiac vessels resulting in a MI last years.  Review of Symptoms:  Constitutional:unremarkable   Head:unremarkable    Eyes:unremarkable   Nose/Mouth/Throat:unremarkable Cardiovascular:  unremarkable   Respiratory:unremarkable   Gastrointestinal:  unremarkable   Genitourinary:unremarkable     Musculoskeletal:unremarkable   Skin:unremarkable Hematolgic/Lymphatic:unremarkable     Allergic/Immunologic:unremarkable     Past Medical History:    Reviewed   Past Medical History  Surgical History: hemorrhoidectomy, knee surgery Medical Problems: MI, CAD, chronic anticoagulation Allergies: nkda Medications: coumadin, atorvastatin, metoprolol, brilinta, amitriptyline, aciphex   Social History:Reviewed  Social History  Preferred Language: English ([place]) Race:  White Ethnicity: Not Hispanic / Latino Age: 46 Years 11 Months Marital Status:  S Alcohol:  No Recreational drug(s):  No   Smoking Status: Never smoker reviewed on 05/15/2012  Family History:  Reviewed   Family History  Is there a family history of:unremarkable    Objective Information: General:  Well appearing, well nourished in no distress. Neck:  Supple without lymphadenopathy.  Heart:  RRR, no murmur Lungs:    CTA bilaterally, no wheezes, rhonchi, rales.  Breathing unlabored. Abdomen:Soft, NT/ND, no HSM, no masses.   Large thrombosed hemorrhoid along right  lateral side, with skin erosion present.  No active bleeding noted.  Assessment:Thrombosed hemorrhoid, chronic anticoagulation  Diagnosis &amp; Procedure: DiagnosisCode: 455.7, ProcedureCode: 99203,    Plan:Scheduled for hemorrhoidectomy on 05/19/12.  Discussed bridging with Jodette, Dr. Nasser's nurse, at [facility] ([phone]).  No bridging needed.  Stopping coumadin and brilinta.   Patient Education:Alternative treatments to surgery were discussed with patient (and family).  Risks and benefits  of procedure were fully explained to the patient (and family) who gave informed consent. Patient/family questions were addressed.  Follow-up:Pending Surgery 

## 2012-05-15 NOTE — Telephone Encounter (Signed)
Spoke with Dr Elease Hashimoto, ok to hold Brilinta and coumadin for 5 days prior to procedure. Will forward to Dr Lovell Sheehan

## 2012-05-15 NOTE — Telephone Encounter (Signed)
Dr Diona Browner spoke with dr Lovell Sheehan, the pt has a thrombosed hemorrhoid and is poss going to need surgery next week. Dr Lovell Sheehan wanted to talk to dr nahser regarding the pts brilinta and coumadin in regards to surgery. soke with dr Harvie Bridge nurse,jodette, will forward to her.

## 2012-05-15 NOTE — Telephone Encounter (Signed)
Spoke with Dr Franky Macho on f/u for med review, informed I will have Orma Flaming D review and will forward him a note as to length of time to be off meds, surgery is set for Monday 05/19/12. I will forward answer back to dr Lovell Sheehan via epic.

## 2012-05-15 NOTE — Telephone Encounter (Signed)
Spoke with Dr Elease Hashimoto, pt does not need bridging. I will take to Orlinda Blalock D for review/ advise for time frames of meds; asa/ coumadin/ brilinta  to hold for hemorrhoidectomy.

## 2012-05-16 ENCOUNTER — Encounter (HOSPITAL_COMMUNITY): Payer: Self-pay

## 2012-05-16 ENCOUNTER — Encounter (HOSPITAL_COMMUNITY)
Admission: RE | Admit: 2012-05-16 | Discharge: 2012-05-16 | Disposition: A | Discharge status: Home / Self Care | Payer: BC Managed Care – PPO | Source: Ambulatory Visit | Attending: General Surgery | Admitting: General Surgery

## 2012-05-16 ENCOUNTER — Ambulatory Visit: Payer: BC Managed Care – PPO | Admitting: Internal Medicine

## 2012-05-16 HISTORY — DX: Other specified postprocedural states: Z98.890

## 2012-05-16 HISTORY — DX: Nausea with vomiting, unspecified: R11.2

## 2012-05-16 HISTORY — DX: Other complications of anesthesia, initial encounter: T88.59XA

## 2012-05-16 HISTORY — DX: Adverse effect of unspecified anesthetic, initial encounter: T41.45XA

## 2012-05-16 LAB — CBC WITH DIFFERENTIAL/PLATELET
Basophils Absolute: 0 10*3/uL (ref 0.0–0.1)
Eosinophils Absolute: 0.1 10*3/uL (ref 0.0–0.7)
Eosinophils Relative: 1 % (ref 0–5)
HCT: 44.6 % (ref 39.0–52.0)
Lymphocytes Relative: 35 % (ref 12–46)
MCH: 31.3 pg (ref 26.0–34.0)
MCHC: 35.4 g/dL (ref 30.0–36.0)
MCV: 88.3 fL (ref 78.0–100.0)
Monocytes Absolute: 0.5 10*3/uL (ref 0.1–1.0)
RDW: 13.1 % (ref 11.5–15.5)
WBC: 6.1 10*3/uL (ref 4.0–10.5)

## 2012-05-16 LAB — BASIC METABOLIC PANEL
BUN: 9 mg/dL (ref 6–23)
CO2: 27 mEq/L (ref 19–32)
Chloride: 103 mEq/L (ref 96–112)
Creatinine, Ser: 0.9 mg/dL (ref 0.50–1.35)
Glucose, Bld: 89 mg/dL (ref 70–99)

## 2012-05-16 NOTE — Patient Instructions (Addendum)
20  20 Eric Fisher  05/16/2012   Your procedure is scheduled on:  05/19/12  Report to [name] at 12:00 PM.  Call this number if you have problems the morning of surgery: [phone]   Remember:   Do not eat food:After Midnight.    Take these medicines the morning of surgery with A SIP OF WATER: aciphex   Do not wear jewelry, make-up or nail polish.  Do not wear lotions, powders, or perfumes. You may wear deodorant.  Do not shave 48 hours prior to surgery. Men may shave face and neck.  Do not bring valuables to the hospital.  Contacts, dentures or bridgework may not be worn into surgery.  Leave suitcase in the car. After surgery it may be brought to your room.  For patients admitted to the hospital, checkout time is 11:00 AM the day of discharge.   Patients discharged the day of surgery will not be allowed to drive home.  Name and phone number of your driver: family  Special Instructions: CHG Shower Use Special Wash: 1/2 bottle night before surgery and 1/2 bottle morning of surgery.   Please read over the following fact sheets that you were given: Pain Booklet, Coughing and Deep Breathing, MRSA Information, Surgical Site Infection Prevention, Anesthesia Post-op Instructions and Care and Recovery After Surgery  PATIENT INSTRUCTIONS POST-ANESTHESIA  IMMEDIATELY FOLLOWING SURGERY:  Do not drive or operate machinery for the first twenty four hours after surgery.  Do not make any important decisions for twenty four hours after surgery or while taking narcotic pain medications or sedatives.  If you develop intractable nausea and vomiting or a severe headache please notify your doctor immediately.  FOLLOW-UP:  Please make an appointment with your surgeon as instructed. You do not need to follow up with anesthesia unless specifically instructed to do so.  WOUND CARE INSTRUCTIONS (if applicable):  Keep a dry clean dressing on the anesthesia/puncture wound site if there is drainage.  Once  the wound has quit draining you may leave it open to air.  Generally you should leave the bandage intact for twenty four hours unless there is drainage.  If the epidural site drains for more than 36-48 hours please call the anesthesia department.  QUESTIONS?:  Please feel free to call your physician or the hospital operator if you have any questions, and they will be happy to assist you.      Hemorrhoidectomy Hemorrhoidectomy is surgery to remove hemorrhoids. Hemorrhoids are veins that have become swollen in the rectum. The rectum is the area from the bottom end of the intestines to the opening where bowel movements leave the body. Hemorrhoids can be uncomfortable. They can cause itching, bleeding and pain if a blood clot forms in them (thrombose). If hemorrhoids are small, surgery may not be needed. But if they cover a larger area, surgery is usually suggested.  LET YOUR CAREGIVER KNOW ABOUT:   Any allergies.   All medications you are taking, including:   Herbs, eyedrops, over-the-counter medications and creams.   Blood thinners (anticoagulants), aspirin or other drugs that could affect blood clotting.   Use of steroids (by mouth or as creams).   Previous problems with anesthetics, including local anesthetics.   Possibility of pregnancy, if this applies.   Any history of blood clots.   Any history of bleeding or other blood problems.   Previous surgery.   Smoking history.   Other health problems.  RISKS AND COMPLICATIONS All surgery carries some risk. However,  hemorrhoid surgery usually goes smoothly. Possible complications could include:  Urinary retention.   Bleeding.   Infection.   A painful incision.   A reaction to the anesthesia (this is not common).  BEFORE THE PROCEDURE   Stop using aspirin and non-steroidal anti-inflammatory drugs (NSAIDs) for pain relief. This includes prescription drugs and over-the-counter drugs such as ibuprofen and naproxen. Also stop  taking vitamin E. If possible, do this two weeks before your surgery.   If you take blood-thinners, ask your healthcare provider when you should stop taking them.   You will probably have blood and urine tests done several days before your surgery.   Do not eat or drink for about 8 hours before the surgery.   Arrive at least an hour before the surgery, or whenever your surgeon recommends. This will give you time to check in and fill out any needed paperwork.   Hemorrhoidectomy is often an outpatient procedure. This means you will be able to go home the same day. Sometimes, though, people stay overnight in the hospital after the procedure. Ask your surgeon what to expect. Either way, make arrangements in advance for someone to drive you home.  PROCEDURE   The preparation:   You will change into a hospital gown.   You will be given an IV. A needle will be inserted in your arm. Medication can flow directly into your body through this needle.   You might be given an enema to clear your rectum.   Once in the operating room, you will probably lie on your side or be repositioned later to lying on your stomach.   You will be given anesthesia (medication) so you will not feel anything during the surgery. The surgery often is done with local anesthesia (the area near the hemorrhoids will be numb and you will be drowsy but awake). Sometimes, general anesthesia is used (you will be asleep during the procedure).   The procedure:   There are a few different procedures for hemorrhoids. Be sure to ask you surgeon about the procedure, the risks and benefits.   Be sure to ask about what you need to do to take care of the wound, if there is one.  AFTER THE PROCEDURE  You will stay in a recovery area until the anesthesia has worn off. Your blood pressure and pulse will be checked every so often.   You may feel a lot of pain in the area of the rectum.   Take all pain medication prescribed by your  surgeon. Ask before taking any over-the-counter pain medicines.   Sometimes sitting in a warm bath can help relieve your pain.   To make sure you have bowel movements without straining:   You will probably need to take stool softeners (usually a pill) for a few days.   You should drink 8 to 10 glasses of water each day.   Your activity will be restricted for awhile. Ask your caregiver for a list of what you should and should not do while you recover.  Document Released: 07/22/2009 Document Revised: 09/13/2011 Document Reviewed: 07/22/2009 [facility] Patient Information 2012 [place], [place].

## 2012-05-17 ENCOUNTER — Other Ambulatory Visit: Payer: Self-pay

## 2012-05-17 ENCOUNTER — Observation Stay (HOSPITAL_COMMUNITY)
Admission: EM | Admit: 2012-05-17 | Discharge: 2012-05-19 | DRG: 124 | Disposition: A | Discharge status: Home / Self Care | Payer: BC Managed Care – PPO | Attending: Cardiology | Admitting: Cardiology

## 2012-05-17 ENCOUNTER — Emergency Department (HOSPITAL_COMMUNITY): Payer: BC Managed Care – PPO

## 2012-05-17 ENCOUNTER — Encounter (HOSPITAL_COMMUNITY): Payer: Self-pay

## 2012-05-17 DIAGNOSIS — K219 Gastro-esophageal reflux disease without esophagitis: Secondary | ICD-10-CM | POA: Diagnosis present

## 2012-05-17 DIAGNOSIS — K648 Other hemorrhoids: Secondary | ICD-10-CM | POA: Diagnosis present

## 2012-05-17 DIAGNOSIS — R109 Unspecified abdominal pain: Secondary | ICD-10-CM

## 2012-05-17 DIAGNOSIS — I252 Old myocardial infarction: Secondary | ICD-10-CM | POA: Insufficient documentation

## 2012-05-17 DIAGNOSIS — Z79899 Other long term (current) drug therapy: Secondary | ICD-10-CM | POA: Insufficient documentation

## 2012-05-17 DIAGNOSIS — M25539 Pain in unspecified wrist: Secondary | ICD-10-CM | POA: Insufficient documentation

## 2012-05-17 DIAGNOSIS — Z7901 Long term (current) use of anticoagulants: Secondary | ICD-10-CM

## 2012-05-17 DIAGNOSIS — I2510 Atherosclerotic heart disease of native coronary artery without angina pectoris: Principal | ICD-10-CM | POA: Diagnosis present

## 2012-05-17 DIAGNOSIS — I249 Acute ischemic heart disease, unspecified: Secondary | ICD-10-CM

## 2012-05-17 DIAGNOSIS — I749 Embolism and thrombosis of unspecified artery: Secondary | ICD-10-CM | POA: Diagnosis present

## 2012-05-17 DIAGNOSIS — Z01812 Encounter for preprocedural laboratory examination: Secondary | ICD-10-CM | POA: Insufficient documentation

## 2012-05-17 HISTORY — DX: Unspecified hemorrhoids: K64.9

## 2012-05-17 LAB — BASIC METABOLIC PANEL
Calcium: 9.4 mg/dL (ref 8.4–10.5)
Chloride: 102 mEq/L (ref 96–112)
Creatinine, Ser: 0.83 mg/dL (ref 0.50–1.35)
GFR calc Af Amer: >90 mL/min (ref >90)

## 2012-05-17 LAB — CBC WITH DIFFERENTIAL/PLATELET
Basophils Absolute: 0 10*3/uL (ref 0.0–0.1)
Basophils Relative: 0 % (ref 0–1)
Eosinophils Relative: 1 % (ref 0–5)
HCT: 42.5 % (ref 39.0–52.0)
MCHC: 35.3 g/dL (ref 30.0–36.0)
Monocytes Absolute: 0.5 10*3/uL (ref 0.1–1.0)
Neutro Abs: 3.5 10*3/uL (ref 1.7–7.7)
Platelets: 177 10*3/uL (ref 150–400)
RDW: 13 % (ref 11.5–15.5)

## 2012-05-17 LAB — PROTIME-INR: Prothrombin Time: 15 seconds (ref 11.6–15.2)

## 2012-05-17 MED ORDER — ASPIRIN 81 MG PO CHEW
324.0000 mg | CHEWABLE_TABLET | Freq: Once | ORAL | Status: AC
  Administered 2012-05-17: 324 mg via ORAL
  Filled 2012-05-17: qty 4

## 2012-05-17 MED ORDER — HEPARIN (PORCINE) IN NACL 100-0.45 UNIT/ML-% IJ SOLN
1150.0000 [IU]/h | INTRAMUSCULAR | Status: DC
  Administered 2012-05-17: 1000 [IU]/h via INTRAVENOUS
  Administered 2012-05-18: 1150 [IU]/h via INTRAVENOUS
  Filled 2012-05-17 (×5): qty 250

## 2012-05-17 MED ORDER — HEPARIN BOLUS VIA INFUSION
4000.0000 [IU] | Freq: Once | INTRAVENOUS | Status: AC
  Administered 2012-05-17: 4000 [IU] via INTRAVENOUS

## 2012-05-17 MED ORDER — NITROGLYCERIN 0.4 MG SL SUBL
0.4000 mg | SUBLINGUAL_TABLET | Freq: Once | SUBLINGUAL | Status: AC
  Administered 2012-05-17: 0.4 mg via SUBLINGUAL
  Filled 2012-05-17: qty 25

## 2012-05-17 MED ORDER — NITROGLYCERIN 2 % TD OINT
1.0000 [in_us] | TOPICAL_OINTMENT | Freq: Once | TRANSDERMAL | Status: AC
  Administered 2012-05-17: 1 [in_us] via TOPICAL
  Filled 2012-05-17: qty 1

## 2012-05-17 NOTE — Progress Notes (Signed)
ANTICOAGULATION CONSULT NOTE - Initial Consult  Pharmacy Consult for Heparin Indication: chest pain/ACS  No Known Allergies  Patient Measurements: Height: 6' (182.9 cm) Weight: 187 lb (84.823 kg) IBW/kg (Calculated) : 77.6    Vital Signs: Temp: 98.4 F (36.9 C) (08/10 2040) Temp src: Oral (08/10 2040) BP: 118/74 mmHg (08/10 2040) Pulse Rate: 98  (08/10 2040)  Labs:  [facility] 05/17/12 2105 05/16/12 1330  HGB 15.0 15.8  HCT 42.5 44.6  PLT 177 159  APTT 29 --  LABPROT 15.0 --  INR 1.16 --  HEPARINUNFRC -- --  CREATININE -- 0.90  CKTOTAL -- --  CKMB -- --  TROPONINI -- --    Estimated Creatinine Clearance: 116.2 ml/min (by C-G formula based on Cr of 0.9).   Medical History: Past Medical History  Diagnosis Date  . GERD (gastroesophageal reflux disease)   . 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 Coronary artery disease   . Anxiety   . Myocardial infarction 03/11/2011  . 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 Gallstones 05/2011    on abd u/s  . Hypertension   . Complication of anesthesia   . PONV (postoperative nausea and vomiting)     Medications:  Scheduled:    . aspirin  324 mg Oral Once  . heparin  4,000 Units Intravenous Once  . nitroGLYCERIN  0.4 mg Sublingual Once    Assessment: Ok for protocol  Goal of Therapy:  Heparin level 0.3-0.7 units/ml Monitor platelets by anticoagulation protocol: Yes   Plan:  Heparin 4000 unit bolus, then 1000 units/hr (12 units/kg/hr) Heparin level at 5 AM, then daily while on heparin Monitor platelets Labs per protocol  Raquel James, Chamaine Stankus Bennett 05/17/2012,9:34 PM

## 2012-05-17 NOTE — ED Notes (Signed)
Report given to [facility] 

## 2012-05-17 NOTE — ED Notes (Addendum)
Pt reports left elbow pain that started about 1 1/2 hours ago. Pt states pain is similar to the pain he had last year when he had his MI. Denies any injury, no SOB. Pulses present, cap refill < 2 seconds, pt w/ good sensation & movement.

## 2012-05-17 NOTE — ED Provider Notes (Signed)
History   This chart was scribed for Dione Booze, MD by Toya Smothers. The patient was seen in room APA02/APA02. Patient's care was started at 2035.  CSN: [id number]  Arrival date & time 05/17/12  2035   First MD Initiated Contact with Patient 05/17/12 2059      Chief Complaint  Patient presents with  . Elbow Pain   HPI  Eric Fisher is a 45 y.o. male with a h/o MI (2012), HTN, and COD presents to the ED complaining of severe left elbow pain onset 2 hours ago. Pt who is typically healthy at baseline reports that while laying down on his right side he suddenly felt a sharp pulsating pain radiating down and up his left arm. Pain was constant and rated at 9/10. Pt treated pain with nitroglycerine prior to arrival at the ED significantly decreasing the pain to 3/10. He denotes mild HA after taking nitroglycerin, though he denies a tingling sensation under of his tongue. Pt is not currently taking blood thinners and denies fever, chills, SOB, chest pain, and emesis.    Past Medical History  Diagnosis Date  . GERD (gastroesophageal reflux disease)   . 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 Coronary artery disease   . Anxiety   . Myocardial infarction 03/11/2011  . 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 Gallstones 05/2011    on abd u/s  . Hypertension   . Complication of anesthesia   . PONV (postoperative nausea and vomiting)     Past Surgical History  Procedure Date  . Knee arthroscopy   . Hemorroidectomy   . Transthoracic echocardiogram 03/13/2011    Left ventricle: Mild distal septal hypokinesis The cavity size was normal. Systolic function was normal. The estimated ejection fraction was 55%. Wall motion was normal  . Esophagogastroduodenoscopy 02/2008    small hh  . Bravo ph study 02/2008    Day 1, 155 episodes of acid reflux with longest episode 29 minutes, DeMeester 41.7. Day 2, 51 episodes, DeMeester 12.4. Two of three episodes of chest pain  correlated with episode of acid reflux. Study done OFF PPI.  Marland Kitchen Esophagogastroduodenoscopy 06/19/2011    Dr. Elmer Ramp esophagus, small hiatal hernia o/w normal stomach  . Cardiac catheterization 03/11/2011    This demonstrated marked ectasia of the proximal LAD with suggestion of a large filling defect consistent  with thrombus.  There was clot extending into the first septal   perforating branch which had slow flow.     Family History  Problem Relation Age of Onset  . Arrhythmia Father   . Colon cancer Neg Hx     History  Substance Use Topics  . Smoking status: Never Smoker   . Smokeless tobacco: Not on file  . Alcohol Use: No    Review of Systems  Constitutional: Negative for fever and chills.  HENT: Negative for congestion, rhinorrhea and neck pain.   Eyes: Negative for pain.  Respiratory: Negative for cough and shortness of breath.   Cardiovascular: Negative for chest pain.  Gastrointestinal: Negative for nausea, vomiting, abdominal pain and diarrhea.  Genitourinary: Negative for dysuria.  Musculoskeletal: Positive for arthralgias (L elbow pain.). Negative for back pain and gait problem.  Skin: Negative for rash.  Neurological: Positive for headaches. Negative for dizziness and weakness.  All other systems reviewed and are negative.   Allergies  Review of patient's allergies indicates no known allergies.  Home Medications  Current Outpatient Rx  Name Route Sig Dispense Refill  . ALPRAZOLAM 0.5 MG PO TABS Oral Take 0.5 mg by mouth 2 (two) times daily as needed. For anxiety     . AMITRIPTYLINE HCL 10 MG PO TABS Oral Take 1 tablet (10 mg total) by mouth at bedtime. 90 tablet 3  . OMEGA-3 FATTY ACIDS 1000 MG PO CAPS Oral Take 2 g by mouth 2 (two) times daily.    Marland Kitchen METOPROLOL TARTRATE 25 MG PO TABS Oral Take 1 tablet (25 mg total) by mouth 2 (two) times daily. 180 tablet 3  . ADULT MULTIVITAMIN W/MINERALS CH Oral Take 1 tablet by mouth every morning.    Marland Kitchen RABEPRAZOLE  SODIUM 20 MG PO TBEC Oral Take 20 mg by mouth every morning.    Marland Kitchen TICAGRELOR 90 MG PO TABS Oral Take 1 tablet (90 mg total) by mouth every 12 (twelve) hours. 180 tablet 3  . WARFARIN SODIUM 5 MG PO TABS Oral Take 5-7.5 mg by mouth daily. Mondays and Thursdays take 1 1/2 tablet (7.5 mg) . Take 5mg  on all other days    . ATORVASTATIN CALCIUM 80 MG PO TABS Oral Take 80 mg by mouth at bedtime.    Marland Kitchen NITROGLYCERIN 0.4 MG SL SUBL Sublingual Place 0.4 mg under the tongue every 5 (five) minutes x 3 doses as needed. Chest pain      BP 107/70  Pulse 72  Temp 98.4 F (36.9 C) (Oral)  Resp 13  Ht 6' (1.829 m)  Wt 187 lb (84.823 kg)  BMI 25.36 kg/m2  SpO2 96%  Physical Exam  Constitutional: He is oriented to person, place, and time. He appears well-developed and well-nourished. No distress.  HENT:  Head: Normocephalic and atraumatic.  Mouth/Throat: Oropharynx is clear and moist. No oropharyngeal exudate.  Eyes: Conjunctivae and EOM are normal. Pupils are equal, round, and reactive to light. Right eye exhibits no discharge. Left eye exhibits no discharge. No scleral icterus.  Neck: Normal range of motion. Neck supple. No tracheal deviation present.  Cardiovascular: Normal rate, regular rhythm and normal heart sounds.   No murmur heard. Pulmonary/Chest: Effort normal and breath sounds normal. No respiratory distress. He has no wheezes.  Abdominal: Soft. He exhibits no distension. There is no tenderness. There is no rebound.  Musculoskeletal: Normal range of motion. He exhibits no edema and no tenderness.  Lymphadenopathy:    He has no cervical adenopathy.  Neurological: He is alert and oriented to person, place, and time. No cranial nerve deficit. Coordination normal.  Skin: Skin is warm and dry. No rash noted. He is not diaphoretic. No erythema.  Psychiatric: He has a normal mood and affect. His behavior is normal.     ED Course  Procedures (including critical care time) DIAGNOSTIC  STUDIES: Oxygen Saturation is 100% on room air, normal by my interpretation.    COORDINATION OF CARE: 2106- Ordered DG Chest Portable 1 View 1 time image. 2111- Evaluated Pt. Pt is without distress, awake, alert, and oriented. Will continue to monitor. 2115- Ordered aspirin chewable tablet 324 mg Once. 2115- Ordered nitroGLYCERIN (NITROSTAT) SL tablet 0.4 mg Once.  Results for orders placed during the hospital encounter of 05/17/12  CBC WITH DIFFERENTIAL      Component Value Range   WBC 6.6  4.0 - 10.5 K/uL   RBC 4.83  4.22 - 5.81 MIL/uL   Hemoglobin 15.0  13.0 - 17.0 g/dL   HCT 16.1  09.6 - 04.5 %   MCV 88.0  78.0 - 100.0 fL   MCH 31.1  26.0 - 34.0 pg   MCHC 35.3  30.0 - 36.0 g/dL   RDW 16.1  09.6 - 04.5 %   Platelets 177  150 - 400 K/uL   Neutrophils Relative 53  43 - 77 %   Neutro Abs 3.5  1.7 - 7.7 K/uL   Lymphocytes Relative 38  12 - 46 %   Lymphs Abs 2.5  0.7 - 4.0 K/uL   Monocytes Relative 7  3 - 12 %   Monocytes Absolute 0.5  0.1 - 1.0 K/uL   Eosinophils Relative 1  0 - 5 %   Eosinophils Absolute 0.1  0.0 - 0.7 K/uL   Basophils Relative 0  0 - 1 %   Basophils Absolute 0.0  0.0 - 0.1 K/uL  BASIC METABOLIC PANEL      Component Value Range   Sodium 137  135 - 145 mEq/L   Potassium 3.6  3.5 - 5.1 mEq/L   Chloride 102  96 - 112 mEq/L   CO2 26  19 - 32 mEq/L   Glucose, Bld 111 (*) 70 - 99 mg/dL   BUN 11  6 - 23 mg/dL   Creatinine, Ser 4.09  0.50 - 1.35 mg/dL   Calcium 9.4  8.4 - 81.1 mg/dL   GFR calc non Af Amer >90  >90 mL/min   GFR calc Af Amer >90  >90 mL/min  PROTIME-INR      Component Value Range   Prothrombin Time 15.0  11.6 - 15.2 seconds   INR 1.16  0.00 - 1.49  TROPONIN I      Component Value Range   Troponin I <0.30  <0.30 ng/mL  APTT      Component Value Range   aPTT 29  24 - 37 seconds  POCT I-STAT TROPONIN I      Component Value Range   Troponin i, poc 0.00  0.00 - 0.08 ng/mL   Comment 3            Dg Chest Portable 1 View  05/17/2012   *RADIOLOGY REPORT*  Clinical Data: Left arm pain  PORTABLE CHEST - 1 VIEW  Comparison: 05/12/2011  Findings: No significant interval change.  Mild right hemidiaphragm elevation.  Cardiomediastinal contours within normal range.  No focal consolidation, pleural effusion, or pneumothorax.  No acute osseous finding.  IMPRESSION: No radiographic evidence of acute cardiopulmonary process.  Original Report Authenticated By: Waneta Martins, M.D.    Date: 05/17/2012  Rate: 74  Rhythm: normal sinus rhythm  QRS Axis: normal  Intervals: normal  ST/T Wave abnormalities: normal  Conduction Disutrbances:none  Narrative Interpretation: Normal ECG. When compared with ECG of 05/12/2011, no significant changes are seen.  Old EKG Reviewed: unchanged    1. Acute coronary syndrome       MDM  Left elbow pain which is worrisome for possible angina equivalent. 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 His presentation at that time was with elbow pain. It is worrisome that elbow pain has recurred only 2 days after discontinuing warfarin and Brilenta. He is therefore treated as a possible acute coronary syndrome, and started on heparin. He was given additional nitroglycerin in the emergency department with complete relief of pain, and the nitroglycerin and was placed. Consultation was obtained with [facility] at [facility]. Dr. Katha Cabal agrees to accept him in transfer.      I personally performed the services described in this documentation, which was scribed in my presence. The recorded information has been reviewed and considered.      Dione Booze, MD 05/17/12 [phone]

## 2012-05-17 NOTE — ED Notes (Signed)
Was laying in bed and all of a sudden I started having left elbow pain. The pain does not get better or worse with movement. When I had my heart attack all I had was left arm pain and no chest pain per pt. MI was in June of 2012.

## 2012-05-18 ENCOUNTER — Encounter (HOSPITAL_COMMUNITY): Payer: Self-pay | Admitting: Cardiology

## 2012-05-18 DIAGNOSIS — I200 Unstable angina: Secondary | ICD-10-CM

## 2012-05-18 LAB — CBC
HCT: 39.5 % (ref 39.0–52.0)
Hemoglobin: 13.7 g/dL (ref 13.0–17.0)
MCH: 30.4 pg (ref 26.0–34.0)
MCHC: 34.7 g/dL (ref 30.0–36.0)

## 2012-05-18 LAB — CARDIAC PANEL(CRET KIN+CKTOT+MB+TROPI)
CK, MB: 1.6 ng/mL (ref 0.3–4.0)
CK, MB: 2.9 ng/mL (ref 0.3–4.0)
Total CK: 219 U/L (ref 7–232)
Troponin I: <0.3 ng/mL (ref <0.30)
Troponin I: <0.3 ng/mL (ref <0.30)

## 2012-05-18 LAB — MRSA PCR SCREENING: MRSA by PCR: NEGATIVE

## 2012-05-18 MED ORDER — ASPIRIN EC 81 MG PO TBEC
81.0000 mg | DELAYED_RELEASE_TABLET | Freq: Every day | ORAL | Status: DC
  Administered 2012-05-18: 81 mg via ORAL
  Filled 2012-05-18: qty 1

## 2012-05-18 MED ORDER — SODIUM CHLORIDE 0.9 % IV SOLN
1.0000 mL/kg/h | INTRAVENOUS | Status: DC
  Administered 2012-05-19 (×2): 1 mL/kg/h via INTRAVENOUS

## 2012-05-18 MED ORDER — NITROGLYCERIN 2 % TD OINT
1.0000 [in_us] | TOPICAL_OINTMENT | Freq: Four times a day (QID) | TRANSDERMAL | Status: DC
  Administered 2012-05-18 – 2012-05-19 (×5): 1 [in_us] via TOPICAL
  Filled 2012-05-18: qty 30

## 2012-05-18 MED ORDER — ADULT MULTIVITAMIN W/MINERALS CH
1.0000 | ORAL_TABLET | Freq: Every morning | ORAL | Status: DC
  Administered 2012-05-18: 1 via ORAL
  Filled 2012-05-18 (×2): qty 1

## 2012-05-18 MED ORDER — DIAZEPAM 5 MG PO TABS
5.0000 mg | ORAL_TABLET | ORAL | Status: AC
  Administered 2012-05-19: 5 mg via ORAL
  Filled 2012-05-18 (×2): qty 1

## 2012-05-18 MED ORDER — SODIUM CHLORIDE 0.9 % IJ SOLN
3.0000 mL | Freq: Two times a day (BID) | INTRAMUSCULAR | Status: DC
  Administered 2012-05-18 (×2): 3 mL via INTRAVENOUS

## 2012-05-18 MED ORDER — ATORVASTATIN CALCIUM 80 MG PO TABS
80.0000 mg | ORAL_TABLET | Freq: Every day | ORAL | Status: DC
  Administered 2012-05-18: 80 mg via ORAL
  Filled 2012-05-18 (×2): qty 1

## 2012-05-18 MED ORDER — ACETAMINOPHEN 325 MG PO TABS: 650.0000 mg | ORAL_TABLET | ORAL | Status: DC | PRN

## 2012-05-18 MED ORDER — OMEGA-3-ACID ETHYL ESTERS 1 G PO CAPS
1.0000 g | ORAL_CAPSULE | Freq: Two times a day (BID) | ORAL | Status: DC
  Administered 2012-05-18 (×2): 1 g via ORAL
  Filled 2012-05-18 (×5): qty 1

## 2012-05-18 MED ORDER — AMITRIPTYLINE HCL 10 MG PO TABS
10.0000 mg | ORAL_TABLET | Freq: Every day | ORAL | Status: DC
  Administered 2012-05-18: 10 mg via ORAL
  Filled 2012-05-18 (×2): qty 1

## 2012-05-18 MED ORDER — SODIUM CHLORIDE 0.9 % IJ SOLN: 3.0000 mL | INTRAMUSCULAR | Status: DC | PRN

## 2012-05-18 MED ORDER — ONDANSETRON HCL 4 MG/2ML IJ SOLN: 4.0000 mg | Freq: Four times a day (QID) | INTRAMUSCULAR | Status: DC | PRN

## 2012-05-18 MED ORDER — ASPIRIN 81 MG PO CHEW
324.0000 mg | CHEWABLE_TABLET | ORAL | Status: AC
  Administered 2012-05-19: 324 mg via ORAL
  Filled 2012-05-18: qty 4

## 2012-05-18 MED ORDER — OMEGA-3 FATTY ACIDS 1000 MG PO CAPS: 2.0000 g | ORAL_CAPSULE | Freq: Two times a day (BID) | ORAL | Status: DC

## 2012-05-18 MED ORDER — ALPRAZOLAM 0.5 MG PO TABS
0.5000 mg | ORAL_TABLET | Freq: Two times a day (BID) | ORAL | Status: DC | PRN
  Administered 2012-05-18: 0.5 mg via ORAL
  Filled 2012-05-18 (×2): qty 1

## 2012-05-18 MED ORDER — NITROGLYCERIN 0.4 MG SL SUBL: 0.4000 mg | SUBLINGUAL_TABLET | SUBLINGUAL | Status: DC | PRN

## 2012-05-18 MED ORDER — SODIUM CHLORIDE 0.9 % IV SOLN: 250.0000 mL | INTRAVENOUS | Status: DC | PRN

## 2012-05-18 MED ORDER — PANTOPRAZOLE SODIUM 40 MG PO TBEC
40.0000 mg | DELAYED_RELEASE_TABLET | Freq: Every day | ORAL | Status: DC
  Administered 2012-05-18: 40 mg via ORAL
  Filled 2012-05-18: qty 1

## 2012-05-18 MED ORDER — ZOLPIDEM TARTRATE 5 MG PO TABS
5.0000 mg | ORAL_TABLET | Freq: Every evening | ORAL | Status: DC | PRN
  Administered 2012-05-18: 5 mg via ORAL
  Filled 2012-05-18: qty 1

## 2012-05-18 MED ORDER — ONDANSETRON HCL 4 MG/2ML IJ SOLN: 4.0000 mg | Freq: Three times a day (TID) | INTRAMUSCULAR | Status: DC | PRN

## 2012-05-18 NOTE — H&P (Signed)
History and Physical  Patient ID: ANIK WESCH MRN: [id number], SOB: [birth year] 46 y.o. Date of Encounter: 05/18/2012, 7:46 AM  Primary Physician: Kari Baars, MD Primary Cardiologist: Dr. Elease Hashimoto  Chief Complaint: left elbow pain  HPI: 46 y.o. male w/ PMHx significant for CAD (dilated coronaries w/ thrombus LAD '12, on Brilinta & coumadin), HTN (he denies), GERD, and Anxiety who transferred [name] on 05/18/2012 with complaints of left elbow pain.  In June 2012 he presented to the [facility] ED with complaints of severe left elbow and anterior chest discomfort with an EKG showing J-point elevation in III, aVF as well as elevated troponins. He underwent cardiac cath revealing LAD with a large area of aneurysmal dilation proximally with suggestion of thrombus as well as subtotally occluded proximal septal perforator and 70% narrowing of second diagonal. LV function was preserved. He was initiated on Brilinta, ASA, and coumadin. Repeat cath on 03/15/11 revealed persistent LAD thrombus and he was discharged on triple therapy as above. The patient presented to the ED again on 03/24/12 with left arm and chest pain, no EKG changes and normal cardiac enzymes. Cath on 03/27/11 revealed marked aneurysmal dilatation of the prox LAD, but no intraluminal thrombus and increased flow down to the septal perforator compared to previous studies. It was felt his pain was noncardiac and he was discharged home on triple therapy. He has done well since that time. ASA was stopped in ~ Dec '12.   On 05/15/12 he stopped his coumadin and Brilinta in preparation for hemorrhoidectomy on 05/19/12. Yesterday evening around 7:15pm he developed sudden onset 9/10 sharp left elbow pain with radiation down to the middle of his forearm and upper arm. It was very similar to his NSTEMI episode. The only difference was the first time the pain was on the inside and now it is on the outside of his elbow. No  associated dizziness, diaphoresis, nausea, sob. No chest pain. No recent strenuous activities or injuries. He took SL NTG x2 with improvement in pain to 3/10. In the ED he was started on Heparin drip and received ASA and NTG patch with resolution of pain around 10pm last night. No return of pain today. EKG revealed NSR with no acute ST/T changes. CXR was without acute cardiopulmonary abnormalities. Labs are significant for normal troponin x2, INR 1.16, unremarkable CBC/BMET.   Past Medical History  Diagnosis Date  . GERD (gastroesophageal reflux disease)   . 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 Coronary artery disease   . Anxiety   . Myocardial infarction 03/11/2011  . 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 Gallstones 05/2011    on abd u/s  . Hypertension   . Complication of anesthesia   . PONV (postoperative nausea and vomiting)      Surgical History:  Past Surgical History  Procedure Date  . Knee arthroscopy   . Hemorroidectomy   . Transthoracic echocardiogram 03/13/2011    Left ventricle: Mild distal septal hypokinesis The cavity size was normal. Systolic function was normal. The estimated ejection fraction was 55%. Wall motion was normal  . Esophagogastroduodenoscopy 02/2008    small hh  . Bravo ph study 02/2008    Day 1, 155 episodes of acid reflux with longest episode 29 minutes, DeMeester 41.7. Day 2, 51 episodes, DeMeester 12.4. Two of three episodes of chest pain correlated with episode of acid reflux. Study done OFF  PPI.  . Esophagogastroduodenoscopy 06/19/2011    Dr. Elmer Ramp esophagus, small hiatal hernia o/w normal stomach  . Cardiac catheterization 03/11/2011    This demonstrated marked ectasia of the proximal LAD with suggestion of a large filling defect consistent  with thrombus.  There was clot extending into the first septal   perforating branch which had slow flow.      Home Meds: Medication Sig  ALPRAZolam (XANAX) 0.5 MG tablet Take  0.5 mg by mouth 2 (two) times daily as needed. For anxiety   amitriptyline (ELAVIL) 10 MG tablet Take 1 tablet (10 mg total) by mouth at bedtime.  fish oil-omega-3 fatty acids 1000 MG capsule Take 2 g by mouth 2 (two) times daily.  metoprolol tartrate (LOPRESSOR) 25 MG tablet Take 1 tablet (25 mg total) by mouth 2 (two) times daily.  Multiple Vitamin (MULTIVITAMIN WITH MINERALS) TABS Take 1 tablet by mouth every morning.  RABEprazole (ACIPHEX) 20 MG tablet Take 20 mg by mouth every morning.  Ticagrelor (BRILINTA) 90 MG TABS tablet Take 1 tablet (90 mg total) by mouth every 12 (twelve) hours.  warfarin (COUMADIN) 5 MG tablet Take 5-7.5 mg by mouth daily. Mondays and Thursdays take 1 1/2 tablet (7.5 mg) . Take 5mg  on all other days  atorvastatin (LIPITOR) 80 MG tablet Take 80 mg by mouth at bedtime.  nitroGLYCERIN (NITROSTAT) 0.4 MG SL tablet Place 0.4 mg under the tongue every 5 (five) minutes x 3 doses as needed. Chest pain    Allergies: No Known Allergies  History   Social History  . Marital Status: Single    Spouse Name: N/A    Number of Children: 0  . Years of Education: N/A   Occupational History  . maintenance [employer]   Social History Main Topics  . Smoking status: Never Smoker   . Smokeless tobacco: Not on file  . Alcohol Use: No  . Drug Use: No  . Sexually Active: Not on file   Other Topics Concern  . Not on file   Social History Narrative  . No narrative on file     Family History  Problem Relation Age of Onset  . Arrhythmia Father   . Colon cancer Neg Hx     Review of Systems: General: negative for chills, fever, night sweats or weight changes.  Cardiovascular: negative for chest pain, shortness of breath, dyspnea on exertion, edema, orthopnea, palpitations, paroxysmal nocturnal dyspnea  Dermatological: negative for rash Respiratory: negative for cough or wheezing Urologic: negative for hematuria Abdominal: (+) "hemorrhoid bleeding";  negative for  nausea, vomiting, diarrhea, melena, or hematemesis Neurologic: negative for visual changes, syncope, or dizziness All other systems reviewed and are otherwise negative except as noted above.  Labs:   Component Value Date   WBC 6.3 05/18/2012   HGB 13.7 05/18/2012   HCT 39.5 05/18/2012   MCV 87.8 05/18/2012   PLT 141* 05/18/2012    Lab 05/17/12 2105  NA 137  K 3.6  CL 102  CO2 26  BUN 11  CREATININE 0.83  CALCIUM 9.4  GLUCOSE 111*   Basename 05/17/12 2357 05/17/12 2105  CKTOTAL -- --  CKMB -- --  TROPONINI <0.30 <0.30    Radiology/Studies:   05/17/2012 - Chest Portable 1 View Findings: No significant interval change.  Mild right hemidiaphragm elevation.  Cardiomediastinal contours within normal range.  No focal consolidation, pleural effusion, or pneumothorax.  No acute osseous finding.  IMPRESSION: No radiographic evidence of acute cardiopulmonary process.  EKG: 05/17/12 @ 2047 - NSR 74bpm, no ischemic changes compared to previous EKG  Physical Exam: Blood pressure 99/61, pulse 58, temperature 97.5 F (36.4 C), temperature source Oral, resp. rate 14, height 6' (1.829 m), weight 187 lb (84.823 kg), SpO2 96.00%. General: Well developed, well nourished, middle aged white male in no acute distress. Head: Normocephalic, atraumatic, sclera non-icteric, nares are without discharge Neck: Supple. Negative for carotid bruits. JVD not elevated. Lungs: Clear bilaterally to auscultation without wheezes, rales, or rhonchi. Breathing is unlabored. Heart: RRR with S1 S2. No murmurs, rubs, or gallops appreciated. Abdomen: Soft, non-tender, non-distended with normoactive bowel sounds. No rebound/guarding. No obvious abdominal masses. Msk:  Strength and tone appear normal for age. Extremities: No edema. No clubbing or cyanosis. Distal pedal pulses are 2+ and equal bilaterally. Neuro: Alert and oriented X 3. Moves all extremities spontaneously. Psych:  Responds to questions appropriately  with a normal affect.    ASSESSMENT AND PLAN:  46 y.o. male w/ PMHx significant for CAD (dilated coronaries w/ thrombus LAD '12, on Brilinta & coumadin), HTN (he denies), GERD, and Anxiety who transferred from [name] on 05/18/2012 with complaints of left elbow pain.  1. Left elbow pain: He presents with pain that is similar to his pain with his NSTEMI last June when he was found to have LAD throbmus. He has been on Brilinta and Coumadin, but stopped three days ago in preparation for surgery. EKG is nonischemic, troponin normal x2. He is pain free on NTG patch. Given his cardiac history and similar presentation he will need cardiac catheterization tomorrow. Cont IV heparin and ASA. Hold Brilinta and coumadin. Hold Lopressor given soft BP. Cont statin.  Signed, HOPE, JESSICA PA-C 05/18/2012, 7:46 AM  History and all data above reviewed.  Patient examined.  I agree with the findings as above.  The patient has chest pain similar to his previous pain at the time he was diagnosed with coronary thrombosis.  He was recently taken off of his warfarin and Brilinta a few days ago for hemorrhoid surgery.The patient exam reveals COR:RRR, no murmur  ,  Lungs: Clear  ,  Abd: Positive bowel sounds, no rebound no guarding, Ext No edema  .  All available labs, radiology testing, previous records reviewed. Agree with documented assessment and plan. Given repeat symptoms and past history cardiac cath is in order.  The patient understands that risks included but are not limited to stroke (1 in 1000), death (1 in 1000), kidney failure [usually temporary] (1 in 500), bleeding (1 in 200), allergic reaction [possibly serious] (1 in 200).  The patient understands and agrees to proceed.   Of note he would like to have his surgery rescheduled for in patient if at all possible. We would have to check with the surgeons but I told him it is unlikely that we can do this.  He reports that he is quite  uncomfortable from this.   Fayrene Fearing [facility]  11:38 AM  05/18/2012

## 2012-05-18 NOTE — Plan of Care (Signed)
Problem: Phase I Progression Outcomes Goal: Other Phase I Outcomes/Goals Outcome: Progressing Voiced understanding of pre and post cardiac cath instructions .

## 2012-05-18 NOTE — Progress Notes (Signed)
ANTICOAGULATION CONSULT NOTE - Follow Up   Pharmacy Consult for Heparin Indication: chest pain/ACS  No Known Allergies  Patient Measurements: Height: 6' (182.9 cm) Weight: 187 lb (84.823 kg) IBW/kg (Calculated) : 77.6    Vital Signs: Temp: 97.5 F (36.4 C) (08/11 0336) Temp src: Oral (08/11 0336) BP: 99/67 mmHg (08/11 0400) Pulse Rate: 59  (08/11 0400)  Labs:  Basename 05/18/12 0509 05/17/12 2357 05/17/12 2105 05/16/12 1330  HGB 13.7 -- 15.0 --  HCT 39.5 -- 42.5 44.6  PLT 141* -- 177 159  APTT -- -- 29 --  LABPROT -- -- 15.0 --  INR -- -- 1.16 --  HEPARINUNFRC 0.41 -- -- --  CREATININE -- -- 0.83 0.90  CKTOTAL -- -- -- --  CKMB -- -- -- --  TROPONINI -- <0.30 <0.30 --    Estimated Creatinine Clearance: 126 ml/min (by C-G formula based on Cr of 0.83).   Medical History: Past Medical History  Diagnosis Date  . GERD (gastroesophageal reflux disease)   . 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 Coronary artery disease   . Anxiety   . Myocardial infarction 03/11/2011  . 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 Gallstones 05/2011    on abd u/s  . Hypertension   . Complication of anesthesia   . PONV (postoperative nausea and vomiting)     Medications:  Scheduled:     . aspirin  324 mg Oral Once  . heparin  4,000 Units Intravenous Once  . nitroGLYCERIN  1 inch Topical Once  . nitroGLYCERIN  1 inch Topical Q6H  . nitroGLYCERIN  0.4 mg Sublingual Once    Assessment: 46 yo male on IV heparin for r/o ACS. Heparin level (0.41) is at-goal on 1000 units/hr.   Goal of Therapy:  Heparin level 0.3-0.7 units/ml Monitor platelets by anticoagulation protocol: Yes   Plan:  1. Continue IV heparin at 1000 units/hr.  2. Confirmatory heparin level at 12:00 3. Daily CBC, heparin level  Emeline Gins 05/18/2012,5:59 AM

## 2012-05-18 NOTE — Progress Notes (Signed)
ANTICOAGULATION CONSULT NOTE - Follow Up   Pharmacy Consult for Heparin Indication: chest pain/ACS  No Known Allergies  Patient Measurements: Height: 6' (182.9 cm) Weight: 187 lb (84.823 kg) IBW/kg (Calculated) : 77.6    Vital Signs: Temp: 98.1 F (36.7 C) (08/11 1200) Temp src: Oral (08/11 1200) BP: 115/73 mmHg (08/11 1000) Pulse Rate: 76  (08/11 1000)  Labs:  Basename 05/18/12 1151 05/18/12 0853 05/18/12 0509 05/17/12 2357 05/17/12 2105 05/16/12 1330  HGB -- -- 13.7 -- 15.0 --  HCT -- -- 39.5 -- 42.5 Eric.6  PLT -- -- 141* -- 177 159  APTT -- -- -- -- 29 --  LABPROT -- -- -- -- 15.0 --  INR -- -- -- -- 1.16 --  HEPARINUNFRC 0.27* -- 0.41 -- -- --  CREATININE -- -- -- -- 0.83 0.90  CKTOTAL -- 92 -- -- -- --  CKMB -- 1.6 -- -- -- --  TROPONINI -- <0.30 -- <0.30 <0.30 --    Estimated Creatinine Clearance: 126 ml/min (by C-G formula based on Cr of 0.83).   Medical History: Past Medical History  Diagnosis Date  . GERD (gastroesophageal reflux disease)   . 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 Coronary artery disease   . Anxiety   . 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 Gallstones 05/2011    on abd u/s  . Hypertension     he denies  . Complication of anesthesia   . PONV (postoperative nausea and vomiting)   . Hemorrhoid     Medications:  Scheduled:     . amitriptyline  10 mg Oral QHS  . aspirin  324 mg Oral Once  . aspirin EC  81 mg Oral Daily  . atorvastatin  80 mg Oral QHS  . heparin  4,000 Units Intravenous Once  . multivitamin with minerals  1 tablet Oral q morning - 10a  . nitroGLYCERIN  1 inch Topical Once  . nitroGLYCERIN  1 inch Topical Q6H  . nitroGLYCERIN  0.4 mg Sublingual Once  . omega-3 acid ethyl esters  1 g Oral BID  . pantoprazole  40 mg Oral Q1200  . sodium chloride  3 mL Intravenous Q12H  . DISCONTD: fish oil-omega-3 fatty acids  2 g Oral BID    Admit Complaint: Eric Fisher admitted 05/17/2012 for  r/o ACS. Pharmacy consulted to dose heparin.  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  H/H trend down but remains WNL.  Cardiovascular: CAD, HTN, ASA, atorva, lovaza, nitro paste: HR 70s, SBP 110-90s   Gastrointestinal / Nutrition: GERD: protonix (on PTA)  Neurology/MSK: Anxiety: amitriptyoline  Nephrology/Urology/Electrolytes: SCr stable CrCl >120  PTA Medication Issues: stopped brilinta and warfarin 3d PTA for hemorrhoid surgery Home Meds Not Ordered: warfarin, brilinta,  Best Practices: DVT Prophylaxis:  IV heparin  Goal of Therapy:  Heparin level 0.3-0.7 units/ml Monitor platelets by anticoagulation protocol: Yes   Plan:  1. Increase heparin to 1150 units/hr 2. Heparin level with AM labs 3. Daily CBC, heparin level   Thank you for allowing pharmacy to be a part of this patients care team.  Lovenia Kim Pharm.D., BCPS Clinical Pharmacist 05/18/2012 1:41 PM Pager: [phone] Phone: [phone]

## 2012-05-19 ENCOUNTER — Encounter (HOSPITAL_COMMUNITY)
Admission: EM | Disposition: A | Discharge status: Home / Self Care | Payer: Self-pay | Source: Home / Self Care | Attending: Emergency Medicine

## 2012-05-19 ENCOUNTER — Encounter (HOSPITAL_COMMUNITY): Payer: Self-pay | Admitting: Physician Assistant

## 2012-05-19 ENCOUNTER — Ambulatory Visit (HOSPITAL_COMMUNITY): Admission: RE | Admit: 2012-05-19 | Payer: BC Managed Care – PPO | Source: Ambulatory Visit | Admitting: General Surgery

## 2012-05-19 ENCOUNTER — Other Ambulatory Visit: Payer: Self-pay

## 2012-05-19 DIAGNOSIS — I249 Acute ischemic heart disease, unspecified: Secondary | ICD-10-CM

## 2012-05-19 DIAGNOSIS — R079 Chest pain, unspecified: Secondary | ICD-10-CM

## 2012-05-19 HISTORY — PX: CARDIAC CATHETERIZATION: SHX172

## 2012-05-19 HISTORY — PX: LEFT HEART CATHETERIZATION WITH CORONARY ANGIOGRAM: SHX5451

## 2012-05-19 LAB — BASIC METABOLIC PANEL
CO2: 26 mEq/L (ref 19–32)
Glucose, Bld: 90 mg/dL (ref 70–99)
Potassium: 3.8 mEq/L (ref 3.5–5.1)
Sodium: 138 mEq/L (ref 135–145)

## 2012-05-19 LAB — CBC
HCT: 38.5 % — ABNORMAL LOW (ref 39.0–52.0)
Hemoglobin: 13.5 g/dL (ref 13.0–17.0)
MCHC: 35.1 g/dL (ref 30.0–36.0)
WBC: 6.6 10*3/uL (ref 4.0–10.5)

## 2012-05-19 SURGERY — HEMORRHOIDECTOMY
Anesthesia: General

## 2012-05-19 SURGERY — LEFT HEART CATHETERIZATION WITH CORONARY ANGIOGRAM
Anesthesia: LOCAL

## 2012-05-19 MED ORDER — LIDOCAINE HCL (PF) 1 % IJ SOLN
INTRAMUSCULAR | Status: AC
  Filled 2012-05-19: qty 30

## 2012-05-19 MED ORDER — MIDAZOLAM HCL 2 MG/2ML IJ SOLN
INTRAMUSCULAR | Status: AC
  Filled 2012-05-19: qty 2

## 2012-05-19 MED ORDER — HEPARIN (PORCINE) IN NACL 2-0.9 UNIT/ML-% IJ SOLN
INTRAMUSCULAR | Status: AC
  Filled 2012-05-19: qty 2000

## 2012-05-19 MED ORDER — FENTANYL CITRATE 0.05 MG/ML IJ SOLN
INTRAMUSCULAR | Status: AC
  Filled 2012-05-19: qty 2

## 2012-05-19 MED ORDER — ASPIRIN 81 MG PO TBEC: 81.0000 mg | DELAYED_RELEASE_TABLET | Freq: Every day | ORAL | Status: AC

## 2012-05-19 MED ORDER — ONDANSETRON HCL 4 MG/2ML IJ SOLN: 4.0000 mg | Freq: Four times a day (QID) | INTRAMUSCULAR | Status: DC | PRN

## 2012-05-19 MED ORDER — ACETAMINOPHEN 325 MG PO TABS: 650.0000 mg | ORAL_TABLET | ORAL | Status: DC | PRN

## 2012-05-19 MED ORDER — SODIUM CHLORIDE 0.9 % IV SOLN: INTRAVENOUS | Status: AC

## 2012-05-19 MED ORDER — NITROGLYCERIN 0.2 MG/ML ON CALL CATH LAB
INTRAVENOUS | Status: AC
  Filled 2012-05-19: qty 1

## 2012-05-19 NOTE — CV Procedure (Signed)
    Cardiac Cath Note  Eric Fisher [id number] June 16, 1966  Procedure: Left Heart Cardiac Catheterization Note Indications: Pt has a hx of ectatic coronary arteries.  He was found to have extensive thrombus formation in his coronaries in June, 2012.  He has been maintained on coumadin, Brilinta, and ASA since that time.  He recently stopped his coumadin and Brilinta to have hemorrhoid surgery.  He started having his angina equivalent ( left arm pain) 2 days later.  He was brought to the hospital for further evaluation.  Procedure Details Consent: Obtained Time Out: Verified patient identification, verified procedure, site/side was marked, verified correct patient position, special equipment/implants available, Radiology Safety Procedures followed,  medications/allergies/relevent history reviewed, required imaging and test results available.  Performed   Medications: Fentanyl: 50 mcg IV Versed: 2 mg IV  The right femoral artery was easily canulated using a modified Seldinger technique.  Hemodynamics:   LV pressure: 117/2 Aortic pressure: 114/76  Angiography   Left Main: The Left main is large and is slightly ectatic.  No stenosis, no thrombi  Left anterior Descending: The proximal LAD is extremely large and ectatic.  There are no thrombi.  The remaining LAD is fairly normal.  Left Circumflex: normal sized.  No stenosis. No significant ectasia.  Right Coronary Artery: large and dominant.  There are minor luminal irregularities.  The distal RCA is mildly ectatic.  The PDA and PLSA are normal size   LV Gram: normal LV function  Complications: No apparent complications Patient did tolerate procedure well.  Contrast used: 60 cc  Conclusions:   1. Ectatic coronary artery disease.  No significant stenosis.  No evidence of ACS .  Will be able to go home today.   2. Well preserved LV function.   I think he can DC his coumadin. Will continue Brilinta and ASA.  We may be able to  change him to Plavix in the future ( generic).  He should be able to reschedule his hemorrhoid surgery soon.  Eric Fisher, Montez Hageman., MD, [facility] 05/19/2012, 10:26 AM Office - [phone] Pager [phone]

## 2012-05-19 NOTE — H&P (View-Only) (Signed)
  NTS SOAP Note  Vital Signs:  Vitals as of: 05/15/2012: Systolic 127: Diastolic 85: Heart Rate 99: Temp 99.20F: Height 3ft 11in: Weight 189Lbs 0 Ounces: Pain Level 5: BMI 26  BMI : 26.36 kg/m2  Subjective: This 72 Years 24 Months old Male presents for of    HEMORRHOIDS: ,Has been having severe rectal pain due to a thrombosed hemorrhoid over the past week.  Started having some rectal bleeding this am.  Creams have not been helpful.  Is anticoagulated due to abnormality of cardiac vessels resulting in a MI last years.  Review of Symptoms:  Constitutional:unremarkable   Head:unremarkable    Eyes:unremarkable   Nose/Mouth/Throat:unremarkable Cardiovascular:  unremarkable   Respiratory:unremarkable   Gastrointestinal:  unremarkable   Genitourinary:unremarkable     Musculoskeletal:unremarkable   Skin:unremarkable Hematolgic/Lymphatic:unremarkable     Allergic/Immunologic:unremarkable     Past Medical History:    Reviewed   Past Medical History  Surgical History: hemorrhoidectomy, knee surgery Medical Problems: MI, CAD, chronic anticoagulation Allergies: nkda Medications: coumadin, atorvastatin, metoprolol, brilinta, amitriptyline, aciphex   Social History:Reviewed  Social History  Preferred Language: English ([place]) Race:  White Ethnicity: Not Hispanic / Latino Age: 46 Years 11 Months Marital Status:  S Alcohol:  No Recreational drug(s):  No   Smoking Status: Never smoker reviewed on 05/15/2012  Family History:  Reviewed   Family History  Is there a family history WU:[id number]    Objective Information: General:  Well appearing, well nourished in no distress. Neck:  Supple without lymphadenopathy.  Heart:  RRR, no murmur Lungs:    CTA bilaterally, no wheezes, rhonchi, rales.  Breathing unlabored. Abdomen:Soft, NT/ND, no HSM, no masses.   Large thrombosed hemorrhoid along right  lateral side, with skin erosion present.  No active bleeding noted.  Assessment:Thrombosed hemorrhoid, chronic anticoagulation  Diagnosis &amp; Procedure: DiagnosisCode: 455.7, ProcedureCode: 62130,    Plan:Scheduled for hemorrhoidectomy on 05/19/12.  Discussed bridging with Jodette, Dr. Arletha Grippe nurse, at [facility] [phone]).  No bridging needed.  Stopping coumadin and brilinta.   Patient Education:Alternative treatments to surgery were discussed with patient (and family).  Risks and benefits  of procedure were fully explained to the patient (and family) who gave informed consent. Patient/family questions were addressed.  Follow-up:Pending Surgery

## 2012-05-19 NOTE — Care Management Note (Signed)
    Page 1 of 1   05/19/2012     8:56:00 AM   CARE MANAGEMENT NOTE 05/19/2012  Patient:  IVAN, LACHER   Account Number:  [id number]  Date Initiated:  05/19/2012  Documentation initiated by:  Junius Creamer  Subjective/Objective Assessment:   adm w ch pain     Action/Plan:   lives w family, pcp dr Buren Kos   Anticipated DC Date:     Anticipated DC Plan:        DC Planning Services  CM consult      Choice offered to / List presented to:             Status of service:   Medicare Important Message given?   (If response is "NO", the following Medicare IM given date fields will be blank) Date Medicare IM given:   Date Additional Medicare IM given:    Discharge Disposition:    Per UR Regulation:  Reviewed for med. necessity/level of care/duration of stay  If discussed at Long Length of Stay Meetings, dates discussed:    Comments:  8/12 8:55a debbie Zoey Bidwell rn,bsn [phone]

## 2012-05-19 NOTE — Discharge Summary (Signed)
Discharge Summary   Patient ID: Eric Fisher,  MRN: [id number], DOB/AGE: [birth year] 46 y.o.  Admit date: 05/17/2012 Discharge date: 05/19/2012  Primary Physician: Kari Baars, MD Primary Cardiologist: Delane Ginger, MD  Discharge Diagnoses Principal Problem:  *CAD (coronary artery disease) Active Problems:  HEMORRHOIDS, INTERNAL  GERD  Embolism and thrombosis of unspecified site  Chronic anticoagulation  Acute coronary syndrome   Allergies No Known Allergies  Diagnostic Studies/Procedures  PORTABLE CHEST X-RAY - 05/17/12  Comparison: 05/12/2011  Findings: No significant interval change. Mild right hemidiaphragm  elevation. Cardiomediastinal contours within normal range. No  focal consolidation, pleural effusion, or pneumothorax. No acute  osseous finding.  IMPRESSION:  No radiographic evidence of acute cardiopulmonary process.  CARDIAC CATHETERIZATION - 05/19/12  Hemodynamics:  LV pressure: 117/2  Aortic pressure: 114/76  Angiography  Left Main: The Left main is large and is slightly ectatic. No stenosis, no thrombi  Left anterior Descending: The proximal LAD is extremely large and ectatic. There are no thrombi. The remaining LAD is fairly normal.  Left Circumflex: normal sized. No stenosis. No significant ectasia.  Right Coronary Artery: large and dominant. There are minor luminal irregularities. The distal RCA is mildly ectatic. The PDA and PLSA are normal size  LV Gram: normal LV function  Complications: No apparent complications  Patient did tolerate procedure well.  Contrast used: 60 cc  Conclusions:  1. Ectatic coronary artery disease. No significant stenosis. No evidence of ACS . Will be able to go home today.  2. Well preserved LV function.   History of Present Illness  Eric Fisher is a 46yo male with PMHx significant for the above problem list who was transferred from [facility] hospital to [facility] on 05/19/11 for chest pain  concerning for ACS.  The patient's cardiac history is outlined as followed:  03/2011: J-point elevation III, aVF, + troponins; cardiac cath- LAD with a large area of aneurysmal dilation proximally with suggestion of thrombus as well as subtotally occluded proximal septal perforator and 70% narrowing of second diagonal. LV function was preserved. Initiated on 3x therapy- ASA/Brilinta/Coumadin. 03/15/11: repeat cath- revealed persistent LAD thrombus and he was discharged on triple therapy as above 03/24/12: unstable angina- marked aneurysmal dilatation of the prox LAD, but no intraluminal thrombus and increased flow down to the septal perforator compared to previous studies. It was felt his pain was noncardiac and he was discharged home on triple therapy.  Since that time, he had done well. ASA was stopped in 09/2011. Recently, he was scheduled for undergo hemorroidectomy on 05/19/12. He stopped Coumadin and Brilinta 4 days prior. The day prior to admission, he developed sudden sharp left elbow and arm pain (anginal equivalent) described as reminiscent to his prior MI. No associated symptoms. He took NTG SL x 2 with symptom improvement, but not alleviated. He presented to [facility] ED. He was started on heparin gtt and received ASA, NTG patch with resolution of his pain. EKG was without ischemic changes, trop-I WNL x 2. INR was subtherapeutic. CXR revealed no acute cardiopulmonary abnormalities. All other labs unremarkable. Given his cardiac history and HPI concerning for unstable angina, the patient was transferred to [facility] and admitted with plans for diagnostic cardiac catheterization the following day.   Hospital Course   He was continued on heparin and remained stable overnight. Cardiac biomarkers post-transfer returned WNL x 3. He was informed, consented and prepped for cardiac catheterization. This was notable for ectatic CAD without evidence of significant stenosis;  well-preserved LV  function. Angiographic data- Large and slightly ectatic left main, no stenosis or thrombi, pLAD extremely large and ectatic, no thombi, remained LAD normal; normal LCx without stenosis or ectasia; minor luminal RCA irregularities, dRCA mildly ectatic; PDA and PLSA normal size. The patient tolerated the procedure well and without complications. The recommendation was made to discontinue Coumadin therapy, and resume Brilinta and ASA. The suggestion was made to consider switching to Plavix in the future due to affordability. Dr. Elease Hashimoto felt that the patient's hemorrhoid surgery could be rescheduled for the near future. He was assessed by Dr. Elease Hashimoto and deemed stable for discharge after appropriate bed rest. He will follow-up with Norma Fredrickson in the office as scheduled and outlined below. He will be discharged on the documented medication regimen below. Coumadin will be discontinue. The patient will resume DAPT- ASA/Brilinta. This information, including activity restrictions, has been clearly outlined in the discharge AVS.    Discharge Vitals:  Blood pressure 110/66, pulse 85, temperature 97.7 F (36.5 C), temperature source Oral, resp. rate 14, height 6' (1.829 m), weight 84.823 kg (187 lb), SpO2 95.00%.   Labs: Recent Labs  [facility] 05/19/12 0617 05/18/12 0509   WBC 6.6 6.3   HGB 13.5 13.7   HCT 38.5* 39.5   MCV 87.7 87.8   PLT 133* 141*    Lab 05/19/12 0617 05/17/12 2105 05/16/12 1330  NA 138 137 139  K 3.8 3.6 4.4  CL 105 102 103  CO2 26 26 27   BUN 13 11 9   CREATININE 0.80 0.83 0.90  CALCIUM 9.0 9.4 9.7  PROT -- -- --  BILITOT -- -- --  ALKPHOS -- -- --  ALT -- -- --  AST -- -- --  AMYLASE -- -- --  LIPASE -- -- --  GLUCOSE 90 111* 89   Recent Labs  Basename 05/18/12 2059 05/18/12 1431 05/18/12 0853   CKTOTAL 82 219 92   CKMB 1.3 2.9 1.6   CKMBINDEX -- -- --   TROPONINI <0.30 <0.30 <0.30   Disposition:  Discharge Orders    Future Appointments: Provider: Department: Dept  Phone: Center:   05/29/2012 9:45 AM Rosalio Macadamia, NP [facility] [phone] None     Follow-up Information    Follow up with Norma Fredrickson, NP on 05/29/2012. (At 9:45 AM for follow-up after this hospitalization. You will see Norma Fredrickson for Dr. Elease Hashimoto. )    Contact information:   [address] [place] [address] [phone]         Discharge Medications:  Medication List  As of 05/19/2012 11:53 AM   START taking these medications         aspirin 81 MG EC tablet   Take 1 tablet (81 mg total) by mouth daily.         CONTINUE taking these medications         ALPRAZolam 0.5 MG tablet   Commonly known as: XANAX      amitriptyline 10 MG tablet   Commonly known as: ELAVIL   Take 1 tablet (10 mg total) by mouth at bedtime.      atorvastatin 80 MG tablet   Commonly known as: LIPITOR      fish oil-omega-3 fatty acids 1000 MG capsule      metoprolol tartrate 25 MG tablet   Commonly known as: LOPRESSOR   Take 1 tablet (25 mg total) by mouth 2 (two) times daily.      multivitamin with  minerals Tabs      nitroGLYCERIN 0.4 MG SL tablet   Commonly known as: NITROSTAT      RABEprazole 20 MG tablet   Commonly known as: ACIPHEX      Ticagrelor 90 MG Tabs tablet   Commonly known as: BRILINTA   Take 1 tablet (90 mg total) by mouth every 12 (twelve) hours.         STOP taking these medications         warfarin 5 MG tablet          Where to get your medications       Information on where to get these meds is not yet available. Ask your nurse or doctor.         aspirin 81 MG EC tablet           Outstanding Labs/Studies: None  Duration of Discharge Encounter: Greater than 30 minutes including physician time.  Signed, R. Hurman Horn, PA-C 05/19/2012, 11:53 AM  Attending Note:   The patient was seen and examined.  Agree with assessment and plan as noted above.  See my cath note from earlier today. No thrombi.  Will  DC coumadin since the combination of coumadin, brilinta, and asa presents a significant bleeding risk.   [name], Montez Hageman., MD, [facility] 05/19/2012, 3:36 PM

## 2012-05-19 NOTE — Progress Notes (Signed)
ANTICOAGULATION CONSULT NOTE - Follow Up   Pharmacy Consult for Heparin Indication: chest pain/ACS  No Known Allergies  Patient Measurements: Height: 6' (182.9 cm) Weight: 187 lb (84.823 kg) IBW/kg (Calculated) : 77.6    Vital Signs: Temp: 97.7 F (36.5 C) (08/12 0800) Temp src: Oral (08/12 0800) BP: 110/66 mmHg (08/12 0800) Pulse Rate: 68  (08/12 0800)  Labs:  Basename 05/19/12 0617 05/18/12 2059 05/18/12 1431 05/18/12 1151 05/18/12 0853 05/18/12 0509 05/17/12 2105 05/16/12 1330  HGB 13.5 -- -- -- -- 13.7 -- --  HCT 38.5* -- -- -- -- 39.5 42.5 --  PLT 133* -- -- -- -- 141* 177 --  APTT -- -- -- -- -- -- 29 --  LABPROT -- -- -- -- -- -- 15.0 --  INR -- -- -- -- -- -- 1.16 --  HEPARINUNFRC 0.31 -- -- 0.27* -- 0.41 -- --  CREATININE 0.80 -- -- -- -- -- 0.83 0.90  CKTOTAL -- 82 219 -- 92 -- -- --  CKMB -- 1.3 2.9 -- 1.6 -- -- --  TROPONINI -- <0.30 <0.30 -- <0.30 -- -- --    Estimated Creatinine Clearance: 130.7 ml/min (by C-G formula based on Cr of 0.8).   Medications:  Scheduled:     . amitriptyline  10 mg Oral QHS  . aspirin  324 mg Oral Pre-Cath  . aspirin EC  81 mg Oral Daily  . atorvastatin  80 mg Oral QHS  . diazepam  5 mg Oral On Call  . multivitamin with minerals  1 tablet Oral q morning - 10a  . nitroGLYCERIN  1 inch Topical Q6H  . omega-3 acid ethyl esters  1 g Oral BID  . pantoprazole  40 mg Oral Q1200  . sodium chloride  3 mL Intravenous Q12H  . DISCONTD: fish oil-omega-3 fatty acids  2 g Oral BID    Admit Complaint: 46 yo male admitted 05/17/2012 for r/o ACS. Pharmacy consulted to dose heparin.  He has a known history of thrombus and on coumadin PTA. He is noted for cath today.  Goal of Therapy:  Heparin level 0.3-0.7 units/ml Monitor platelets by anticoagulation protocol: Yes   Plan:  1. No heparin changes needed 2. Heparin level with AM labs 3. Will follow post cath  Thank you for allowing pharmacy to be a part of this patients care  team.  Harland German, Pharm D 05/19/2012 8:14 AM

## 2012-05-19 NOTE — Interval H&P Note (Signed)
History and Physical Interval Note:  05/19/2012 9:59 AM  Eric Fisher  has presented today for surgery, with the diagnosis of cp  The various methods of treatment have been discussed with the patient and family. After consideration of risks, benefits and other options for treatment, the patient has consented to  Procedure(s) (LRB): LEFT HEART CATHETERIZATION WITH CORONARY ANGIOGRAM (N/A) as a surgical intervention .  The patient's history has been reviewed, patient examined, no change in status, stable for surgery.  I have reviewed the patient's chart and labs.  Questions were answered to the patient's satisfaction.     [name].

## 2012-05-20 ENCOUNTER — Telehealth: Payer: Self-pay | Admitting: Cardiovascular Disease

## 2012-05-20 ENCOUNTER — Encounter (HOSPITAL_COMMUNITY): Payer: Self-pay | Admitting: Pharmacy Technician

## 2012-05-20 NOTE — Telephone Encounter (Signed)
Pt's mother calling re pt needing to rs hemroid surgery that was scheduled yesterday that was cxl because pt ended up in the hospital, is it ok to rs and how far out should he go? pls call pt

## 2012-05-20 NOTE — Telephone Encounter (Signed)
Told pt to contact surgeon, his office will contact us if there is question, spoke last week with surgeon and they received msg to have him stop 5 days prior, will wait to see if more information needed from surgeon.

## 2012-05-21 ENCOUNTER — Encounter (HOSPITAL_COMMUNITY)
Admission: RE | Admit: 2012-05-21 | Discharge: 2012-05-21 | Payer: BC Managed Care – PPO | Source: Ambulatory Visit | Admitting: General Surgery

## 2012-05-21 ENCOUNTER — Encounter (HOSPITAL_COMMUNITY): Payer: Self-pay

## 2012-05-21 ENCOUNTER — Telehealth: Payer: Self-pay | Admitting: Internal Medicine

## 2012-05-21 ENCOUNTER — Other Ambulatory Visit: Payer: Self-pay | Admitting: Gastroenterology

## 2012-05-21 NOTE — Telephone Encounter (Signed)
August recall has that patient needs to have ULTRASOUND in one year

## 2012-05-23 ENCOUNTER — Ambulatory Visit (HOSPITAL_COMMUNITY)
Admission: RE | Admit: 2012-05-23 | Discharge: 2012-05-23 | Disposition: A | Discharge status: Home / Self Care | Payer: BC Managed Care – PPO | Source: Ambulatory Visit | Attending: General Surgery | Admitting: General Surgery

## 2012-05-23 ENCOUNTER — Encounter (HOSPITAL_COMMUNITY): Payer: Self-pay | Admitting: Anesthesiology

## 2012-05-23 ENCOUNTER — Encounter (HOSPITAL_COMMUNITY)
Admission: RE | Disposition: A | Discharge status: Home / Self Care | Payer: Self-pay | Source: Ambulatory Visit | Attending: General Surgery

## 2012-05-23 ENCOUNTER — Ambulatory Visit (HOSPITAL_COMMUNITY): Payer: BC Managed Care – PPO | Admitting: Anesthesiology

## 2012-05-23 DIAGNOSIS — Z7901 Long term (current) use of anticoagulants: Secondary | ICD-10-CM | POA: Insufficient documentation

## 2012-05-23 DIAGNOSIS — I10 Essential (primary) hypertension: Secondary | ICD-10-CM | POA: Insufficient documentation

## 2012-05-23 DIAGNOSIS — K645 Perianal venous thrombosis: Secondary | ICD-10-CM | POA: Insufficient documentation

## 2012-05-23 HISTORY — PX: HEMORRHOID SURGERY: SHX153

## 2012-05-23 SURGERY — HEMORRHOIDECTOMY
Anesthesia: General | Site: Rectum | Wound class: Clean Contaminated

## 2012-05-23 MED ORDER — ERTAPENEM SODIUM 1 G IJ SOLR
INTRAMUSCULAR | Status: AC
  Filled 2012-05-23: qty 1

## 2012-05-23 MED ORDER — OXYCODONE-ACETAMINOPHEN 7.5-325 MG PO TABS: 1.0000 | ORAL_TABLET | ORAL | Status: DC | PRN

## 2012-05-23 MED ORDER — FENTANYL CITRATE 0.05 MG/ML IJ SOLN: 25.0000 ug | INTRAMUSCULAR | Status: DC | PRN

## 2012-05-23 MED ORDER — PROPOFOL 10 MG/ML IV BOLUS
INTRAVENOUS | Status: DC | PRN
  Administered 2012-05-23: 150 mg via INTRAVENOUS

## 2012-05-23 MED ORDER — LACTATED RINGERS IV SOLN
INTRAVENOUS | Status: DC
  Administered 2012-05-23: 07:00:00 via INTRAVENOUS

## 2012-05-23 MED ORDER — ONDANSETRON HCL 4 MG/2ML IJ SOLN
4.0000 mg | Freq: Once | INTRAMUSCULAR | Status: AC
  Administered 2012-05-23: 4 mg via INTRAVENOUS

## 2012-05-23 MED ORDER — FENTANYL CITRATE 0.05 MG/ML IJ SOLN
INTRAMUSCULAR | Status: DC | PRN
  Administered 2012-05-23 (×2): 50 ug via INTRAVENOUS

## 2012-05-23 MED ORDER — SODIUM CHLORIDE 0.9 % IR SOLN
Status: DC | PRN
  Administered 2012-05-23: 1

## 2012-05-23 MED ORDER — LIDOCAINE HCL (PF) 1 % IJ SOLN
INTRAMUSCULAR | Status: AC
  Filled 2012-05-23: qty 5

## 2012-05-23 MED ORDER — ONDANSETRON HCL 4 MG/2ML IJ SOLN
INTRAMUSCULAR | Status: AC
  Filled 2012-05-23: qty 2

## 2012-05-23 MED ORDER — ONDANSETRON HCL 4 MG/2ML IJ SOLN
4.0000 mg | Freq: Once | INTRAMUSCULAR | Status: AC | PRN
  Administered 2012-05-23: 4 mg via INTRAVENOUS

## 2012-05-23 MED ORDER — BUPIVACAINE HCL (PF) 0.5 % IJ SOLN
INTRAMUSCULAR | Status: AC
  Filled 2012-05-23: qty 30

## 2012-05-23 MED ORDER — KETOROLAC TROMETHAMINE 30 MG/ML IJ SOLN
INTRAMUSCULAR | Status: AC
  Filled 2012-05-23: qty 1

## 2012-05-23 MED ORDER — GLYCOPYRROLATE 0.2 MG/ML IJ SOLN
INTRAMUSCULAR | Status: AC
  Filled 2012-05-23: qty 1

## 2012-05-23 MED ORDER — MIDAZOLAM HCL 5 MG/5ML IJ SOLN
INTRAMUSCULAR | Status: DC | PRN
  Administered 2012-05-23: 2 mg via INTRAVENOUS

## 2012-05-23 MED ORDER — KETOROLAC TROMETHAMINE 30 MG/ML IJ SOLN
30.0000 mg | Freq: Once | INTRAMUSCULAR | Status: AC
  Administered 2012-05-23: 30 mg via INTRAVENOUS

## 2012-05-23 MED ORDER — LACTATED RINGERS IV SOLN
INTRAVENOUS | Status: DC
  Administered 2012-05-23: 08:00:00 via INTRAVENOUS

## 2012-05-23 MED ORDER — BUPIVACAINE HCL (PF) 0.5 % IJ SOLN
INTRAMUSCULAR | Status: DC | PRN
  Administered 2012-05-23: 10 mL

## 2012-05-23 MED ORDER — MIDAZOLAM HCL 2 MG/2ML IJ SOLN
INTRAMUSCULAR | Status: AC
  Filled 2012-05-23: qty 2

## 2012-05-23 MED ORDER — MIDAZOLAM HCL 2 MG/2ML IJ SOLN
1.0000 mg | INTRAMUSCULAR | Status: DC | PRN
  Administered 2012-05-23: 2 mg via INTRAVENOUS

## 2012-05-23 MED ORDER — LIDOCAINE HCL 1 % IJ SOLN
INTRAMUSCULAR | Status: DC | PRN
  Administered 2012-05-23: 50 mg via INTRADERMAL

## 2012-05-23 MED ORDER — GLYCOPYRROLATE 0.2 MG/ML IJ SOLN
0.2000 mg | Freq: Once | INTRAMUSCULAR | Status: AC
  Administered 2012-05-23: 0.2 mg via INTRAVENOUS

## 2012-05-23 MED ORDER — LIDOCAINE VISCOUS 2 % MT SOLN
OROMUCOSAL | Status: DC | PRN
  Administered 2012-05-23: 1

## 2012-05-23 MED ORDER — SODIUM CHLORIDE 0.9 % IV SOLN
1.0000 g | INTRAVENOUS | Status: DC | PRN
  Administered 2012-05-23: 1 g via INTRAVENOUS

## 2012-05-23 MED ORDER — FENTANYL CITRATE 0.05 MG/ML IJ SOLN
INTRAMUSCULAR | Status: AC
  Filled 2012-05-23: qty 2

## 2012-05-23 MED ORDER — SODIUM CHLORIDE 0.9 % IV SOLN: 1.0000 g | INTRAVENOUS | Status: DC

## 2012-05-23 MED ORDER — PROPOFOL 10 MG/ML IV EMUL
INTRAVENOUS | Status: AC
  Filled 2012-05-23: qty 20

## 2012-05-23 MED ORDER — LIDOCAINE VISCOUS 2 % MT SOLN
OROMUCOSAL | Status: AC
  Filled 2012-05-23: qty 15

## 2012-05-23 SURGICAL SUPPLY — 27 items
BAG HAMPER (MISCELLANEOUS) ×2 IMPLANT
CLOTH BEACON ORANGE TIMEOUT ST (SAFETY) ×2 IMPLANT
COVER LIGHT HANDLE STERIS (MISCELLANEOUS) ×4 IMPLANT
DECANTER SPIKE VIAL GLASS SM (MISCELLANEOUS) ×2 IMPLANT
DRAPE PROXIMA HALF (DRAPES) ×2 IMPLANT
ELECT REM PT RETURN 9FT ADLT (ELECTROSURGICAL) ×2
ELECTRODE REM PT RTRN 9FT ADLT (ELECTROSURGICAL) ×1 IMPLANT
FORMALIN 10 PREFIL 120ML (MISCELLANEOUS) ×2 IMPLANT
GLOVE BIO SURGEON STRL SZ7.5 (GLOVE) ×2 IMPLANT
GLOVE BIOGEL PI IND STRL 7.0 (GLOVE) ×1 IMPLANT
GLOVE BIOGEL PI INDICATOR 7.0 (GLOVE) ×1
GLOVE ECLIPSE 6.5 STRL STRAW (GLOVE) ×4 IMPLANT
GOWN STRL REIN XL XLG (GOWN DISPOSABLE) ×4 IMPLANT
HEMOSTAT SURGICEL 4X8 (HEMOSTASIS) ×2 IMPLANT
KIT ROOM TURNOVER AP CYSTO (KITS) ×2 IMPLANT
LIGASURE IMPACT 36 18CM CVD LR (INSTRUMENTS) ×2 IMPLANT
MANIFOLD NEPTUNE II (INSTRUMENTS) ×2 IMPLANT
NEEDLE HYPO 25X1 1.5 SAFETY (NEEDLE) ×2 IMPLANT
NS IRRIG 1000ML POUR BTL (IV SOLUTION) ×2 IMPLANT
PACK PERI GYN (CUSTOM PROCEDURE TRAY) ×2 IMPLANT
PAD ARMBOARD 7.5X6 YLW CONV (MISCELLANEOUS) ×2 IMPLANT
SET BASIN LINEN APH (SET/KITS/TRAYS/PACK) ×2 IMPLANT
SHEARS HARMONIC 9CM CVD (BLADE) IMPLANT
SPONGE GAUZE 4X4 12PLY (GAUZE/BANDAGES/DRESSINGS) ×2 IMPLANT
SUT SILK 0 FSL (SUTURE) ×2 IMPLANT
SUT VIC AB 2-0 CT2 27 (SUTURE) IMPLANT
SYR CONTROL 10ML LL (SYRINGE) ×4 IMPLANT

## 2012-05-23 NOTE — Interval H&P Note (Signed)
History and Physical Interval Note:  05/23/2012 7:30 AM  Eric Fisher  has presented today for surgery, with the diagnosis of Hemorrhoids  The various methods of treatment have been discussed with the patient and family. After consideration of risks, benefits and other options for treatment, the patient has consented to  Procedure(s) (LRB): HEMORRHOIDECTOMY (N/A) as a surgical intervention .  The patient's history has been reviewed, patient examined, no change in status, stable for surgery.  I have reviewed the patient's chart and labs.  Questions were answered to the patient's satisfaction.     Franky Macho A

## 2012-05-23 NOTE — Transfer of Care (Signed)
Immediate Anesthesia Transfer of Care Note  Patient: Eric Fisher  Procedure(s) Performed: Procedure(s) (LRB): HEMORRHOIDECTOMY (N/A)  Patient Location: PACU  Anesthesia Type: General  Level of Consciousness: awake and patient cooperative  Airway & Oxygen Therapy: Patient Spontanous Breathing and Patient connected to face mask oxygen  Post-op Assessment: Report given to PACU RN, Post -op Vital signs reviewed and stable and Patient moving all extremities  Post vital signs: Reviewed and stable  Complications: No apparent anesthesia complications

## 2012-05-23 NOTE — Op Note (Signed)
Patient:  DARON STUTZ  DOB:  [birth year]  MRN:  [id number]   Preop Diagnosis:  Thrombosed hemorrhoid  Postop Diagnosis:  Same  Procedure:  Hemorrhoidectomy  Surgeon:  Franky Macho, M.D.  Anes:  General endotracheal  Indications:  Patient is a 46 year old white male who presents with a large thrombosed internal and external hemorrhoid. The risks and benefits of the procedure including bleeding, infection, and recurrence of the hemorrhoid were fully explained to the patient, who gave informed consent.  Procedure note:  Patient was placed in the lithotomy position after induction of general endotracheal anesthesia. The hernia was prepped and draped using usual sterile technique with Betadine. Surgical site confirmation was performed.  A large thrombosed internal and external hemorrhoid was noted at the 7:00 position. The thrombus was excised and the hemorrhoid was removed using the LigaSure. No other hemorrhoids were noted. 0.5% Sensorcaine was instilled the surrounding peritoneum. No bleeding was noted at the end of the procedure. Surgicel and Viscous Xylocaine rectal packing was then placed.  All tape and needle counts were correct at the end of the procedure. Patient was awakened and transferred to PACU in stable condition.  Complications:  None  EBL:  Minimal  Specimen:  Hemorrhoids

## 2012-05-23 NOTE — Anesthesia Procedure Notes (Signed)
Procedure Name: LMA Insertion Date/Time: 05/23/2012 7:48 AM Performed by: Despina Hidden Pre-anesthesia Checklist: Emergency Drugs available, Patient identified, Patient being monitored and Suction available Patient Re-evaluated:Patient Re-evaluated prior to inductionOxygen Delivery Method: Circle system utilized Preoxygenation: Pre-oxygenation with 100% oxygen Intubation Type: IV induction Ventilation: Mask ventilation without difficulty LMA: LMA inserted LMA Size: 4.0 Tube type: Oral Number of attempts: 1 Placement Confirmation: breath sounds checked- equal and bilateral and positive ETCO2 Tube secured with: Tape Dental Injury: Teeth and Oropharynx as per pre-operative assessment

## 2012-05-23 NOTE — Progress Notes (Signed)
Rectal packing removed without difficulty. 

## 2012-05-23 NOTE — H&P (View-Only) (Signed)
  NTS SOAP Note  Vital Signs:  Vitals as of: 05/15/2012: Systolic 127: Diastolic 85: Heart Rate 99: Temp 99.3F: Height 5ft 11in: Weight 189Lbs 0 Ounces: Pain Level 5: BMI 26  BMI : 26.36 kg/m2  Subjective: This 46 Years 11 Months old Male presents for of    HEMORRHOIDS: ,Has been having severe rectal pain due to a thrombosed hemorrhoid over the past week.  Started having some rectal bleeding this am.  Creams have not been helpful.  Is anticoagulated due to abnormality of cardiac vessels resulting in a MI last years.  Review of Symptoms:  Constitutional:unremarkable   Head:unremarkable    Eyes:unremarkable   Nose/Mouth/Throat:unremarkable Cardiovascular:  unremarkable   Respiratory:unremarkable   Gastrointestinal:  unremarkable   Genitourinary:unremarkable     Musculoskeletal:unremarkable   Skin:unremarkable Hematolgic/Lymphatic:unremarkable     Allergic/Immunologic:unremarkable     Past Medical History:    Reviewed   Past Medical History  Surgical History: hemorrhoidectomy, knee surgery Medical Problems: MI, CAD, chronic anticoagulation Allergies: nkda Medications: coumadin, atorvastatin, metoprolol, brilinta, amitriptyline, aciphex   Social History:Reviewed  Social History  Preferred Language: English ([place]) Race:  White Ethnicity: Not Hispanic / Latino Age: 46 Years 11 Months Marital Status:  S Alcohol:  No Recreational drug(s):  No   Smoking Status: Never smoker reviewed on 05/15/2012  Family History:  Reviewed   Family History  Is there a family history of:unremarkable    Objective Information: General:  Well appearing, well nourished in no distress. Neck:  Supple without lymphadenopathy.  Heart:  RRR, no murmur Lungs:    CTA bilaterally, no wheezes, rhonchi, rales.  Breathing unlabored. Abdomen:Soft, NT/ND, no HSM, no masses.   Large thrombosed hemorrhoid along right  lateral side, with skin erosion present.  No active bleeding noted.  Assessment:Thrombosed hemorrhoid, chronic anticoagulation  Diagnosis &amp; Procedure: DiagnosisCode: 455.7, ProcedureCode: 99203,    Plan:Scheduled for hemorrhoidectomy on 05/19/12.  Discussed bridging with Jodette, Dr. Nasser's nurse, at [facility] [phone]).  No bridging needed.  Stopping coumadin and brilinta.   Patient Education:Alternative treatments to surgery were discussed with patient (and family).  Risks and benefits  of procedure were fully explained to the patient (and family) who gave informed consent. Patient/family questions were addressed.  Follow-up:Pending Surgery 

## 2012-05-23 NOTE — Anesthesia Postprocedure Evaluation (Signed)
  Anesthesia Post-op Note  Patient: Eric Fisher  Procedure(s) Performed: Procedure(s) (LRB): HEMORRHOIDECTOMY (N/A)  Patient Location: PACU  Anesthesia Type: General  Level of Consciousness: awake, alert , oriented and patient cooperative  Airway and Oxygen Therapy: Patient Spontanous Breathing  Post-op Pain: 3 /10, mild  Post-op Assessment: Post-op Vital signs reviewed, Patient's Cardiovascular Status Stable, Respiratory Function Stable, Patent Airway, No signs of Nausea or vomiting and Pain level controlled  Post-op Vital Signs: Reviewed and stable  Complications: No apparent anesthesia complications

## 2012-05-23 NOTE — Anesthesia Preprocedure Evaluation (Signed)
Anesthesia Evaluation  Patient identified by MRN, date of birth, ID band Patient awake    Reviewed: Allergy & Precautions, H&P , NPO status , Patient's Chart, lab work & pertinent test results, reviewed documented beta blocker date and time   History of Anesthesia Complications (+) PONV  Airway Mallampati: I TM Distance: >3 FB Neck ROM: Full    Dental No notable dental hx.    Pulmonary neg pulmonary ROS,    Pulmonary exam normal       Cardiovascular hypertension, Pt. on medications and Pt. on home beta blockers + CAD and + Past MI Rhythm:Regular Rate:Normal     Neuro/Psych Anxiety negative neurological ROS     GI/Hepatic Neg liver ROS, GERD-  Medicated and Controlled,  Endo/Other  negative endocrine ROS  Renal/GU negative Renal ROS     Musculoskeletal negative musculoskeletal ROS (+)   Abdominal Normal abdominal exam  (+)   Peds  Hematology negative hematology ROS (+)   Anesthesia Other Findings   Reproductive/Obstetrics                           Anesthesia Physical Anesthesia Plan  ASA: III  Anesthesia Plan: General   Post-op Pain Management:    Induction: Intravenous  Airway Management Planned: LMA  Additional Equipment:   Intra-op Plan:   Post-operative Plan: Extubation in OR  Informed Consent: I have reviewed the patients History and Physical, chart, labs and discussed the procedure including the risks, benefits and alternatives for the proposed anesthesia with the patient or authorized representative who has indicated his/her understanding and acceptance.     Plan Discussed with: CRNA  Anesthesia Plan Comments:         Anesthesia Quick Evaluation

## 2012-05-28 ENCOUNTER — Encounter (HOSPITAL_COMMUNITY): Payer: Self-pay | Admitting: General Surgery

## 2012-05-29 ENCOUNTER — Encounter: Payer: Self-pay | Admitting: Nurse Practitioner

## 2012-05-29 ENCOUNTER — Ambulatory Visit (INDEPENDENT_AMBULATORY_CARE_PROVIDER_SITE_OTHER): Payer: BC Managed Care – PPO | Admitting: Nurse Practitioner

## 2012-05-29 VITALS — BP 100/78 | HR 94 | Ht 72.0 in | Wt 184.8 lb

## 2012-05-29 DIAGNOSIS — I2510 Atherosclerotic heart disease of native coronary artery without angina pectoris: Secondary | ICD-10-CM

## 2012-05-29 DIAGNOSIS — Z9889 Other specified postprocedural states: Secondary | ICD-10-CM

## 2012-05-29 NOTE — Patient Instructions (Signed)
Stay on your current regimen  We will see you back at your regular follow up visit with Dr. Elease Hashimoto  Call the [facility] office at [phone] if you have any questions, problems or concerns.

## 2012-05-29 NOTE — Progress Notes (Signed)
Eric Fisher Date of Birth: 1966/06/21 Medical Record #[id number]  History of Present Illness: Eric Fisher is seen back today for a post hospital visit. He is seen for Dr. Elease Hashimoto. He has CAD with aneurysmal coronaries, on double therapy anticoagulation with Brilinta and coumadin, and GERD. He was most recent hospitalized for chest pain. He had been scheduled to undergo hemorroidectomy and was off of his coumadin and Brilinta for 4 days prior. On the day of admission, he developed left elbow and arm pain which is his anginal equivalent. He was cathed the next day. He has ectatic CAD without evidence of significant stenosis or thrombus and well preserved LV function. It was recommended that he stop coumadin and proceed with just aspirin and Brilinta. He has since had his hemorrhoid surgery without problems.   He comes in today. He is here alone. He is doing ok. No further arm pain. He is sore from his surgery and has had just minimal bleeding. He is trying to not strain. No narcotics used in the last couple of days. Using stool softeners and Dulcolax. Groin is ok. Overall, he seems to be ok from our standpoint.   Current Outpatient Prescriptions on File Prior to Visit  Medication Sig Dispense Refill  . acetaminophen (TYLENOL) 500 MG tablet Take 1,000 mg by mouth every 6 (six) hours as needed. For pain      . ALPRAZolam (XANAX) 0.5 MG tablet Take 0.5 mg by mouth 2 (two) times daily as needed. For anxiety       . amitriptyline (ELAVIL) 10 MG tablet Take 1 tablet (10 mg total) by mouth at bedtime.  90 tablet  3  . aspirin EC 81 MG EC tablet Take 1 tablet (81 mg total) by mouth daily.      Marland Kitchen atorvastatin (LIPITOR) 80 MG tablet Take 80 mg by mouth at bedtime.      . fish oil-omega-3 fatty acids 1000 MG capsule Take 2 g by mouth 2 (two) times daily.      . metoprolol tartrate (LOPRESSOR) 25 MG tablet Take 1 tablet (25 mg total) by mouth 2 (two) times daily.  180 tablet  3  . Multiple Vitamin  (MULTIVITAMIN WITH MINERALS) TABS Take 1 tablet by mouth every morning.      . nitroGLYCERIN (NITROSTAT) 0.4 MG SL tablet Place 0.4 mg under the tongue every 5 (five) minutes x 3 doses as needed. Chest pain      . oxyCODONE-acetaminophen (PERCOCET) 7.5-325 MG per tablet Take 1-2 tablets by mouth every 4 (four) hours as needed for pain.  40 tablet  0  . RABEprazole (ACIPHEX) 20 MG tablet Take 20 mg by mouth every morning.      . Ticagrelor (BRILINTA) 90 MG TABS tablet Take 1 tablet (90 mg total) by mouth every 12 (twelve) hours.  180 tablet  3    No Known Allergies  Past Medical History  Diagnosis Date  . GERD (gastroesophageal reflux disease)   . 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 Coronary artery disease     ectatic coronaries per cath in August 2013 - managed with Brilinta and aspirin  . Anxiety   . 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 Gallstones 05/2011    on abd u/s  . Hypertension     he denies  . Complication of anesthesia   . PONV (postoperative nausea and vomiting)   . Hemorrhoid  s/p surgery August 2013    Past Surgical History  Procedure Date  . Knee arthroscopy   . Hemorroidectomy   . Transthoracic echocardiogram 03/13/2011    Left ventricle: Mild distal septal hypokinesis The cavity size was normal. Systolic function was normal. The estimated ejection fraction was 55%. Wall motion was normal  . Esophagogastroduodenoscopy 02/2008    small hh  . Bravo ph study 02/2008    Day 1, 155 episodes of acid reflux with longest episode 29 minutes, DeMeester 41.7. Day 2, 51 episodes, DeMeester 12.4. Two of three episodes of chest pain correlated with episode of acid reflux. Study done OFF PPI.  Marland Kitchen Esophagogastroduodenoscopy 06/19/2011    Dr. Elmer Ramp esophagus, small hiatal hernia o/w normal stomach  . Cardiac catheterization 03/11/2011    This demonstrated marked ectasia of the proximal LAD with suggestion of a large filling defect consistent   with thrombus.  There was clot extending into the first septal   perforating branch which had slow flow.   . Cardiac catheterization 05/19/2012    Angiographic data- large and slightly ectatic left main, no stenosis or thrombi, pLAD extremely large and ectatic, no thombi, remained LAD normal; normal LCx without stenosis or ectasia; minor luminal RCA irregularities, dRCA mildly ectatic; PDA and PLSA normal size.   Marland Kitchen Hemorrhoid surgery 05/23/2012    Procedure: HEMORRHOIDECTOMY;  Surgeon: Dalia Heading, MD;  Location: [facility];  Service: General;  Laterality: N/A;    History  Smoking status  . Never Smoker   Smokeless tobacco  . Not on file    History  Alcohol Use No    Family History  Problem Relation Age of Onset  . Arrhythmia Father   . Colon cancer Neg Hx     Review of Systems: The review of systems is per the HPI.  All other systems were reviewed and are negative.  Physical Exam: BP 100/78  Pulse 94  Ht 6' (1.829 m)  Wt 184 lb 12.8 oz (83.825 kg)  BMI 25.06 kg/m2  SpO2 94% Patient is very pleasant and in no acute distress. Skin is warm and dry. Color is normal.  HEENT is unremarkable. Normocephalic/atraumatic. PERRL. Sclera are nonicteric. Neck is supple. No masses. No JVD. Lungs are clear. Cardiac exam shows a regular rate and rhythm. Abdomen is soft. Extremities are without edema. Gait and ROM are intact. No gross neurologic deficits noted.  LABORATORY DATA:  Coronary Angiography   Left Main: The Left main is large and is slightly ectatic. No stenosis, no thrombi  Left anterior Descending: The proximal LAD is extremely large and ectatic. There are no thrombi. The remaining LAD is fairly normal.  Left Circumflex: normal sized. No stenosis. No significant ectasia.  Right Coronary Artery: large and dominant. There are minor luminal irregularities. The distal RCA is mildly ectatic. The PDA and PLSA are normal size   LV Gram: normal LV function   Conclusions:  1. Ectatic  coronary artery disease. No significant stenosis. No evidence of ACS . Will be able to go home today.  2. Well preserved LV function.   I think he can DC his coumadin. Will continue Brilinta and ASA. We may be able to change him to Plavix in the future ( generic).  He should be able to reschedule his hemorrhoid surgery soon.   [name], Montez Hageman., MD, [facility]  05/19/2012, 10:26 AM  Lab Results  Component Value Date   WBC 6.6 05/19/2012   HGB 13.5 05/19/2012   HCT 38.5*  05/19/2012   PLT 133* 05/19/2012   GLUCOSE 90 05/19/2012   CHOL 88 03/26/2012   TRIG 51.0 03/26/2012   HDL 39.80 03/26/2012   LDLCALC 38 03/26/2012   ALT 53 03/26/2012   AST 36 03/26/2012   NA 138 05/19/2012   K 3.8 05/19/2012   CL 105 05/19/2012   CREATININE 0.80 05/19/2012   BUN 13 05/19/2012   CO2 26 05/19/2012   INR 1.16 05/17/2012   HGBA1C  Value: 5.2 (NOTE)                                                                       According to the ADA Clinical Practice Recommendations for 2011, when HbA1c is used as a screening test:   >=6.5%   Diagnostic of Diabetes Mellitus           (if abnormal result  is confirmed)  5.7-6.4%   Increased risk of developing Diabetes Mellitus  References:Diagnosis and Classification of Diabetes Mellitus,Diabetes Care,2011,34(Suppl 1):S62-S69 and Standards of Medical Care in         Diabetes - 2011,Diabetes Care,2011,34  (Suppl 1):S11-S61. 03/11/2011    Assessment / Plan:  1. S/P cardiac cath for chest pain - results are as noted above. Continue with Brilinta and aspirin. He was subsequently seen by Dr. Elease Hashimoto who has explained to him that he thought his cath looked better and that it was now safe to stop the Coumadin. May consider Plavix in the future if it becomes a cost factor, but for now will keep him on his current regimen. We will see him back as scheduled in December. Risk factor modification is encouraged.   2. S/P hemorrhoid surgery - slowly progressing.   We will see him back in December.  No change in his current therapy. Patient is agreeable to this plan and will call if any problems develop in the interim.

## 2012-07-20 IMAGING — CR DG CHEST 1V PORT
1 series · 1 of 1 positions shown · non-contrast
Comparison: CT of 12/26/2007.  No prior plain film.

CLINICAL DATA: Short of breath.  Chest pain.  Right-sided wheezing.

PORTABLE CHEST - 1 VIEW

[view not recorded]
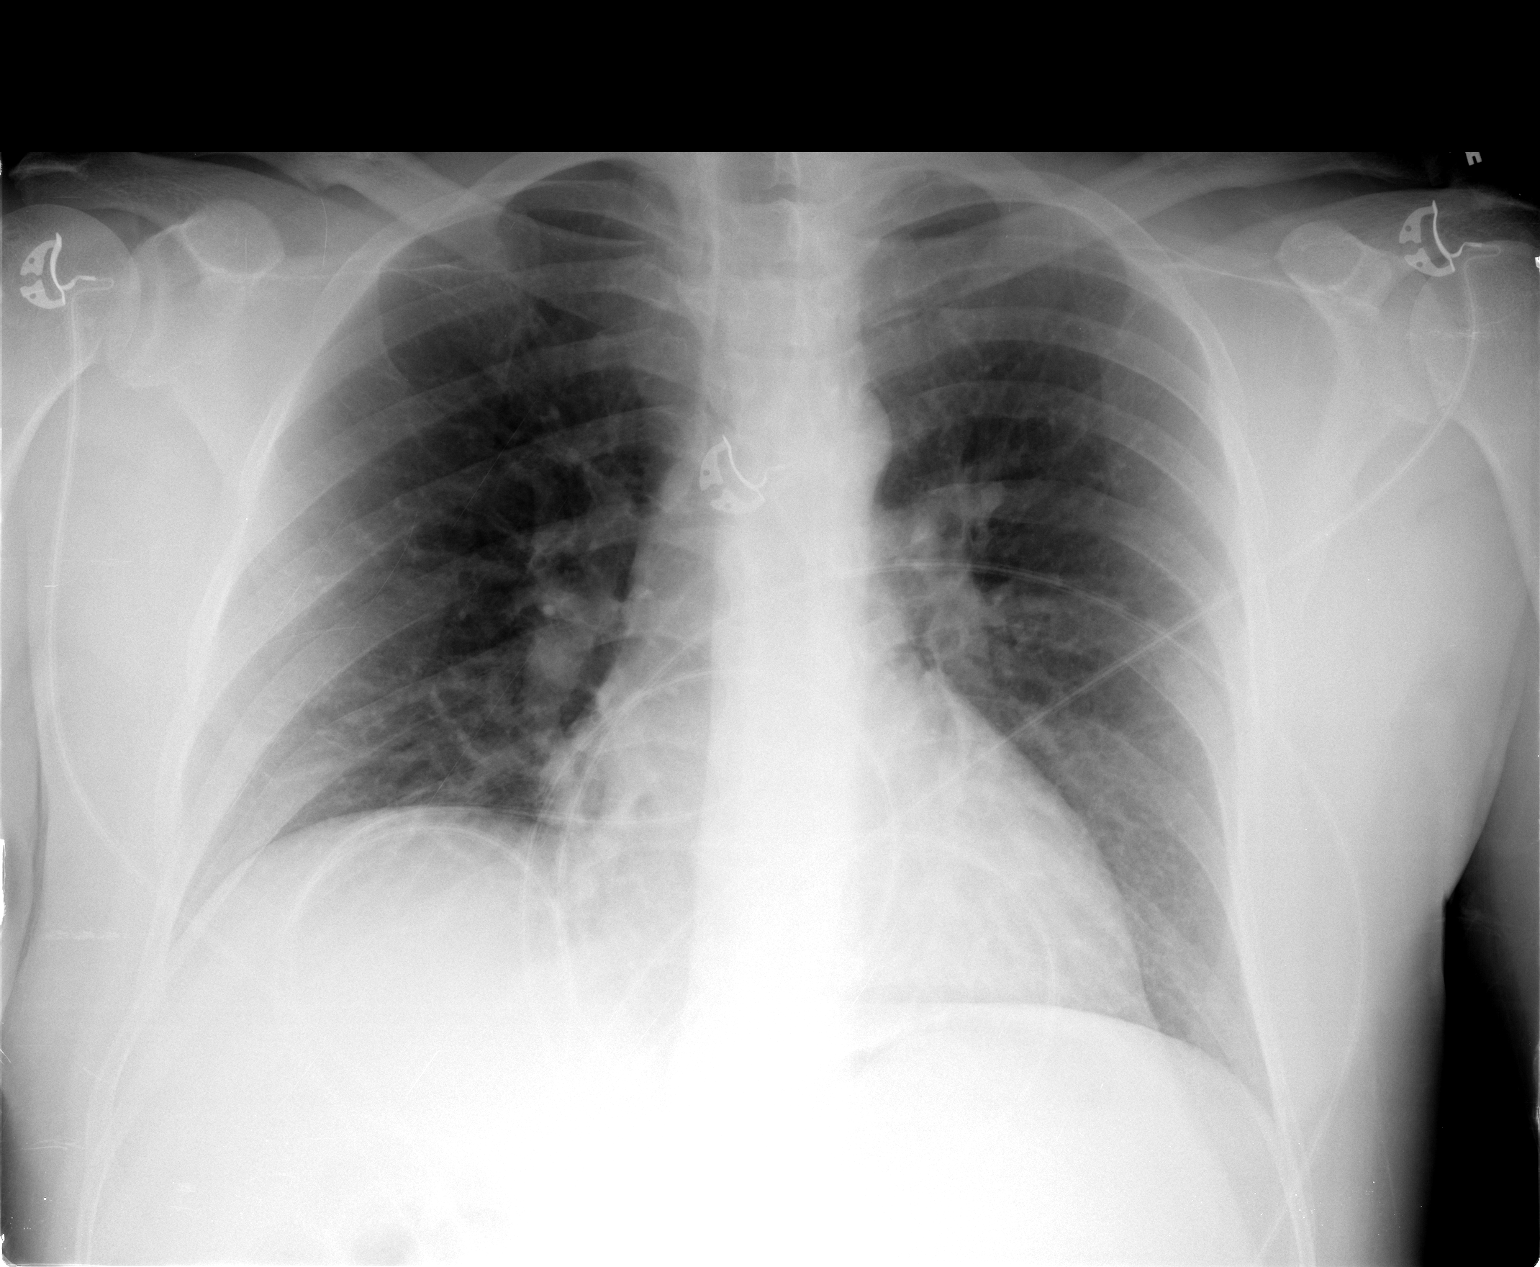

[1 of 1 positions shown; findings below may reference images not displayed]

FINDINGS: Moderate right hemidiaphragm elevation. Midline trachea.
Normal heart size for level of inspiration.  No pleural effusion or
pneumothorax.  Clear lungs.
IMPRESSION: No acute cardiopulmonary disease.

## 2012-07-24 ENCOUNTER — Encounter: Payer: Self-pay | Admitting: Cardiovascular Disease

## 2012-08-03 IMAGING — CR DG CHEST 1V PORT
1 series · 1 of 1 positions shown · non-contrast
Comparison: 03/11/2011

CLINICAL DATA: Chest pain, left arm numbness/tingling, recent MI

PORTABLE CHEST - 1 VIEW

[view not recorded]
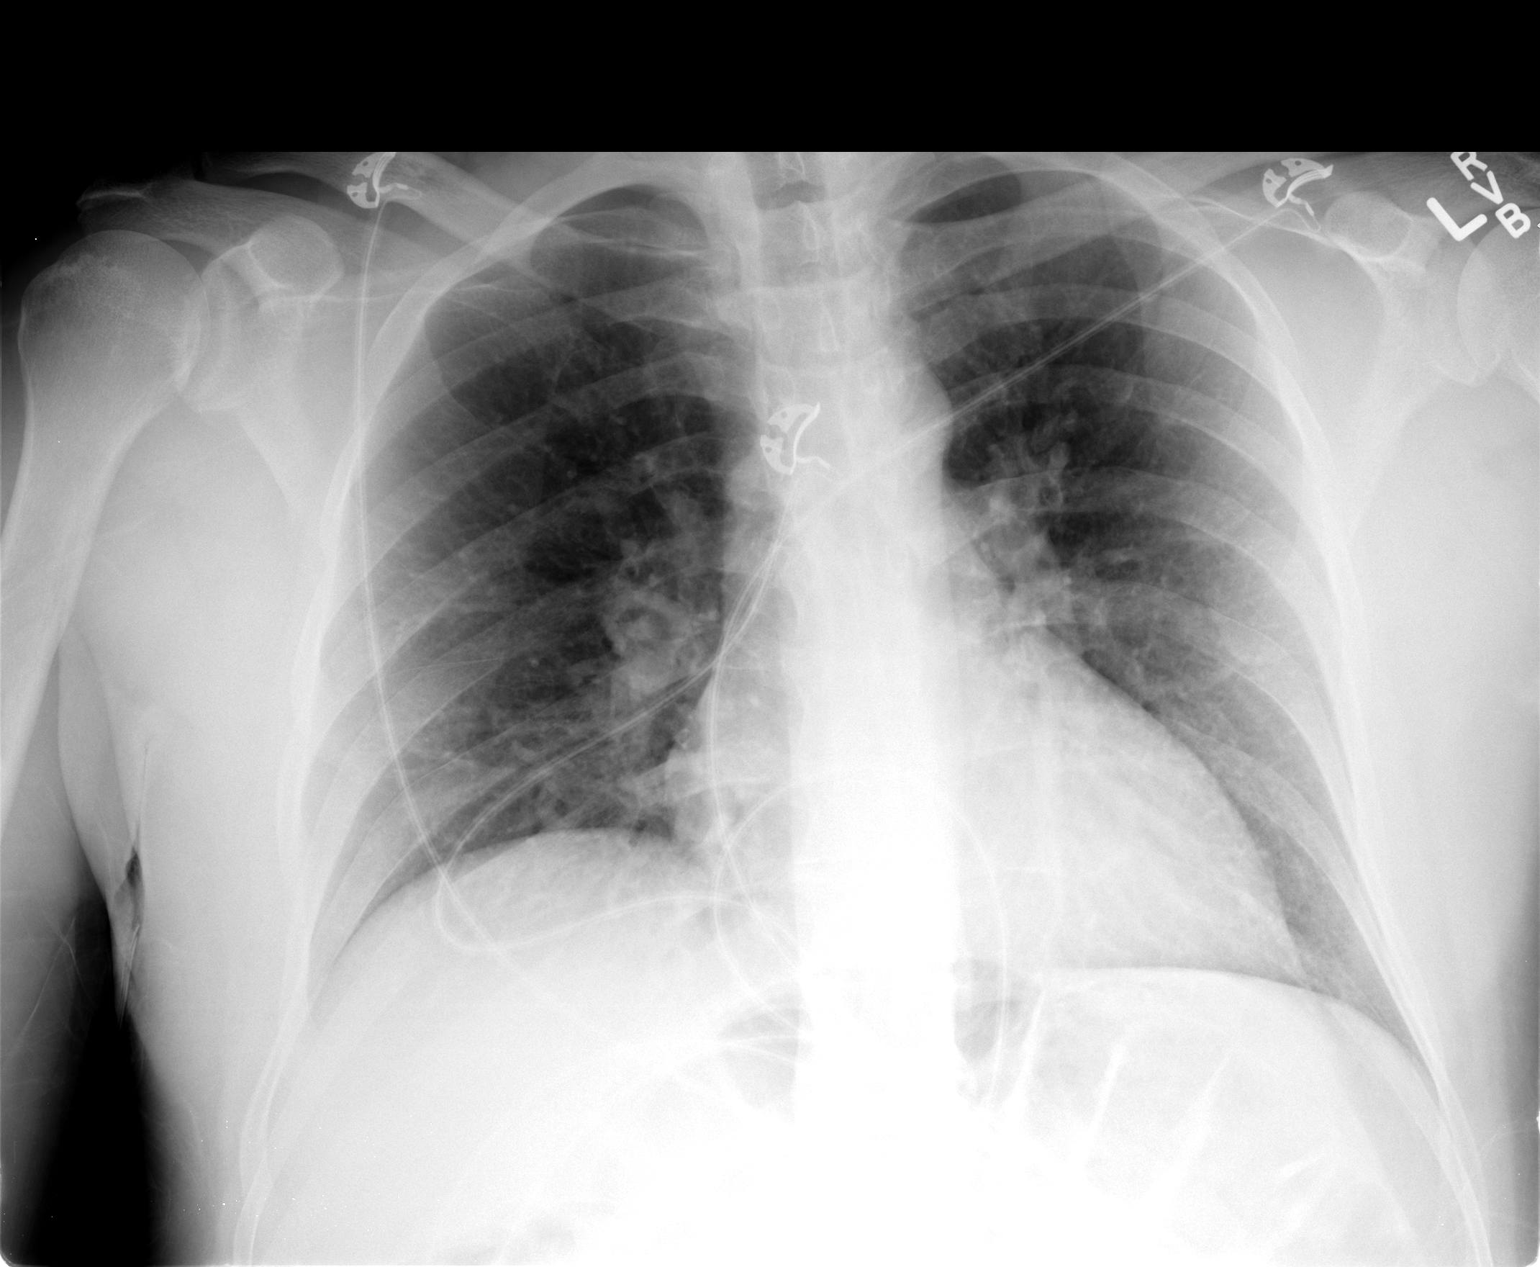

[1 of 1 positions shown; findings below may reference images not displayed]

FINDINGS: Lungs are clear. No pleural effusion or pneumothorax.

The heart is top normal in size.

Degenerative changes of the visualized thoracolumbar spine.
IMPRESSION: No evidence of acute cardiopulmonary disease.

## 2012-08-22 ENCOUNTER — Other Ambulatory Visit: Payer: Self-pay | Admitting: Cardiovascular Disease

## 2012-08-22 DIAGNOSIS — I2510 Atherosclerotic heart disease of native coronary artery without angina pectoris: Secondary | ICD-10-CM

## 2012-08-22 MED ORDER — TICAGRELOR 90 MG PO TABS: 90.0000 mg | ORAL_TABLET | Freq: Two times a day (BID) | ORAL | Status: DC

## 2012-08-22 MED ORDER — ATORVASTATIN CALCIUM 80 MG PO TABS: 80.0000 mg | ORAL_TABLET | Freq: Every day | ORAL | Status: DC

## 2012-09-01 ENCOUNTER — Other Ambulatory Visit: Payer: Self-pay | Admitting: *Deleted

## 2012-09-01 MED ORDER — METOPROLOL TARTRATE 25 MG PO TABS: 25.0000 mg | ORAL_TABLET | Freq: Two times a day (BID) | ORAL | Status: DC

## 2012-09-01 NOTE — Telephone Encounter (Signed)
Pt needs appointment then refill can be made Fax Received. Refill Completed. Clemons Salvucci Chowoe (R.M.A)   

## 2012-09-15 ENCOUNTER — Ambulatory Visit (INDEPENDENT_AMBULATORY_CARE_PROVIDER_SITE_OTHER): Payer: BC Managed Care – PPO | Admitting: Cardiovascular Disease

## 2012-09-15 ENCOUNTER — Other Ambulatory Visit (INDEPENDENT_AMBULATORY_CARE_PROVIDER_SITE_OTHER): Payer: BC Managed Care – PPO

## 2012-09-15 ENCOUNTER — Encounter: Payer: Self-pay | Admitting: Cardiovascular Disease

## 2012-09-15 VITALS — BP 134/92 | HR 76 | Ht 72.0 in | Wt 198.8 lb

## 2012-09-15 DIAGNOSIS — I2510 Atherosclerotic heart disease of native coronary artery without angina pectoris: Secondary | ICD-10-CM

## 2012-09-15 DIAGNOSIS — E785 Hyperlipidemia, unspecified: Secondary | ICD-10-CM

## 2012-09-15 LAB — LIPID PANEL
Cholesterol: 98 mg/dL (ref 0–200)
LDL Cholesterol: 49 mg/dL (ref 0–99)
Total CHOL/HDL Ratio: 2
Triglycerides: 47 mg/dL (ref 0.0–149.0)
VLDL: 9.4 mg/dL (ref 0.0–40.0)

## 2012-09-15 LAB — HEPATIC FUNCTION PANEL
Albumin: 4.1 g/dL (ref 3.5–5.2)
Total Protein: 7.5 g/dL (ref 6.0–8.3)

## 2012-09-15 LAB — BASIC METABOLIC PANEL
CO2: 27 mEq/L (ref 19–32)
Chloride: 104 mEq/L (ref 96–112)
Potassium: 4.1 mEq/L (ref 3.5–5.1)

## 2012-09-15 NOTE — Patient Instructions (Addendum)
Your physician recommends that you return for lab work in: TODAY, BMET LIPIDS/ HEPATIC  Your physician wants you to follow-up in: 6 MONTHS WITH AN EKG  You will receive a reminder letter in the mail two months in advance. If you don't receive a letter, please call our office to schedule the follow-up appointment.

## 2012-09-15 NOTE — Assessment & Plan Note (Signed)
Bethann Berkshire has done well since we discontinued her Coumadin. He is now on aspirin and Brilinta.  We discussed various methods of evaluation 1140. I do not think that repeat cardiac catheterization given his stability is 40. We also could consider a CT angiogram of his heart. This would be relatively low risk from an invasive standpoint but would involve a radiation exposure. I think it we probably should do a CT angiogram in another year or so. We can certainly do one sooner if clinically indicated.  He will continue with the same medications. We'll check fasting labs today. I'll see him again in 6 months for an office visit, EKG, fasting lipid profile, basic metabolic profile, and hepatic profile.

## 2012-09-15 NOTE — Progress Notes (Signed)
Eric Fisher Date of Birth  December 31, 1965       [facility]    [place] [address], [address]  [address] [place], [place]  [address]   [place], [place]  [address] [phone]     [phone]   Fax  [phone]    Fax [phone]  Problem List: 1. 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 Hyperlipidemia   History of Present Illness: Eric Fisher is a 46 year old gentleman with a history of coronary artery disease. 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 We treated with Brilinta, aspirin, and Coumadin. He's had some atypical episodes of chest pain since last saw him. None of his symptoms were similar to his presenting episodes of angina. He exercises on a daily basis and does not have any episodes of chest pain.  He has been maintained on Brilinta and Coumadin he seems to be doing fairly well. He is quite active and has not had any recurrent episodes of angina.  Dec. 9, 2013 When I last saw him several months ago we discontinued his Coumadin. We have maintained the Brilinta and ASA 81 QD. He has been able to all of his normal activities without any chest pain.   Current Outpatient Prescriptions on File Prior to Visit  Medication Sig Dispense Refill  . acetaminophen (TYLENOL) 500 MG tablet Take 1,000 mg by mouth every 6 (six) hours as needed. For pain      . ALPRAZolam (XANAX) 0.5 MG tablet Take 0.5 mg by mouth 2 (two) times daily as needed. For anxiety       . amitriptyline (ELAVIL) 10 MG tablet Take 1 tablet (10 mg total) by mouth at bedtime.  90 tablet  3  . aspirin EC 81 MG EC tablet Take 1 tablet (81 mg total) by mouth daily.      Marland Kitchen atorvastatin (LIPITOR) 80 MG tablet Take 1 tablet (80 mg total) by mouth at bedtime.  90 tablet  1  . fish oil-omega-3 fatty acids 1000 MG capsule Take 1,400 mg by mouth 4 (four) times daily.       . metoprolol tartrate  (LOPRESSOR) 25 MG tablet Take 1 tablet (25 mg total) by mouth 2 (two) times daily.  180 tablet  0  . Multiple Vitamin (MULTIVITAMIN WITH MINERALS) TABS Take 1 tablet by mouth every morning.      . nitroGLYCERIN (NITROSTAT) 0.4 MG SL tablet Place 0.4 mg under the tongue every 5 (five) minutes x 3 doses as needed. Chest pain      . oxyCODONE-acetaminophen (PERCOCET) 7.5-325 MG per tablet Take 1-2 tablets by mouth every 4 (four) hours as needed for pain.  40 tablet  0  . RABEprazole (ACIPHEX) 20 MG tablet Take 20 mg by mouth every morning.      . Ticagrelor (BRILINTA) 90 MG TABS tablet Take 1 tablet (90 mg total) by mouth every 12 (twelve) hours.  180 tablet  1    No Known Allergies  Past Medical History  Diagnosis Date  . GERD (gastroesophageal reflux disease)   . 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 Coronary artery disease     ectatic coronaries per cath in August 2013 - managed with Brilinta and aspirin  . Anxiety   . 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 Gallstones 05/2011    on abd u/s  .  Hypertension     he denies  . Complication of anesthesia   . PONV (postoperative nausea and vomiting)   . Hemorrhoid     s/p surgery August 2013    Past Surgical History  Procedure Date  . Knee arthroscopy   . Hemorroidectomy   . Transthoracic echocardiogram 03/13/2011    Left ventricle: Mild distal septal hypokinesis The cavity size was normal. Systolic function was normal. The estimated ejection fraction was 55%. Wall motion was normal  . Esophagogastroduodenoscopy 02/2008    small hh  . Bravo ph study 02/2008    Day 1, 155 episodes of acid reflux with longest episode 29 minutes, DeMeester 41.7. Day 2, 51 episodes, DeMeester 12.4. Two of three episodes of chest pain correlated with episode of acid reflux. Study done OFF PPI.  Marland Kitchen Esophagogastroduodenoscopy 06/19/2011    Dr. Elmer Ramp esophagus, small hiatal hernia o/w normal stomach  . Cardiac catheterization  03/11/2011    This demonstrated marked ectasia of the proximal LAD with suggestion of a large filling defect consistent  with thrombus.  There was clot extending into the first septal   perforating branch which had slow flow.   . Cardiac catheterization 05/19/2012    Angiographic data- large and slightly ectatic left main, no stenosis or thrombi, pLAD extremely large and ectatic, no thombi, remained LAD normal; normal LCx without stenosis or ectasia; minor luminal RCA irregularities, dRCA mildly ectatic; PDA and PLSA normal size.   Marland Kitchen Hemorrhoid surgery 05/23/2012    Procedure: HEMORRHOIDECTOMY;  Surgeon: Dalia Heading, MD;  Location: [facility];  Service: General;  Laterality: N/A;    History  Smoking status  . Never Smoker   Smokeless tobacco  . Not on file    History  Alcohol Use No    Family History  Problem Relation Age of Onset  . Arrhythmia Father   . Colon cancer Neg Hx     Reviw of Systems:  Reviewed in the HPI.  All other systems are negative.  Physical Exam: Blood pressure 134/92, pulse 76, height 6' (1.829 m), weight 198 lb 12.8 oz (90.175 kg), SpO2 98.00%. General: Well developed, well nourished, in no acute distress.  Head: Normocephalic, atraumatic, sclera non-icteric, mucus membranes are moist,   Neck: Supple. Carotids are 2 + without bruits. No JVD  Lungs: Clear bilaterally to auscultation.  Heart: regular rate.  normal  S1 S2. No murmurs, gallops or rubs.  Abdomen: Soft, non-tender, non-distended with normal bowel sounds. No hepatomegaly. No rebound/guarding. No masses.  Msk:  Strength and tone are normal  Extremities: No clubbing or cyanosis. No edema.  Distal pedal pulses are 2+ and equal bilaterally.  Neuro: Alert and oriented X 3. Moves all extremities spontaneously.  Psych:  Responds to questions appropriately with a normal affect.  ECG:  Assessment / Plan:

## 2012-09-17 ENCOUNTER — Telehealth: Payer: Self-pay | Admitting: Cardiovascular Disease

## 2012-09-17 MED ORDER — METOPROLOL TARTRATE 25 MG PO TABS: 25.0000 mg | ORAL_TABLET | Freq: Two times a day (BID) | ORAL | Status: DC

## 2012-09-17 NOTE — Telephone Encounter (Signed)
Plz return call to pt [phone] regarding prescriptions

## 2012-09-20 IMAGING — CR DG CHEST 2V
2 series · 2 of 2 positions shown · non-contrast
Comparison: 03/25/2011

CLINICAL DATA: Chest pain.  History of MI in [REDACTED].

CHEST - 2 VIEW

[view not recorded (1 of 2)]
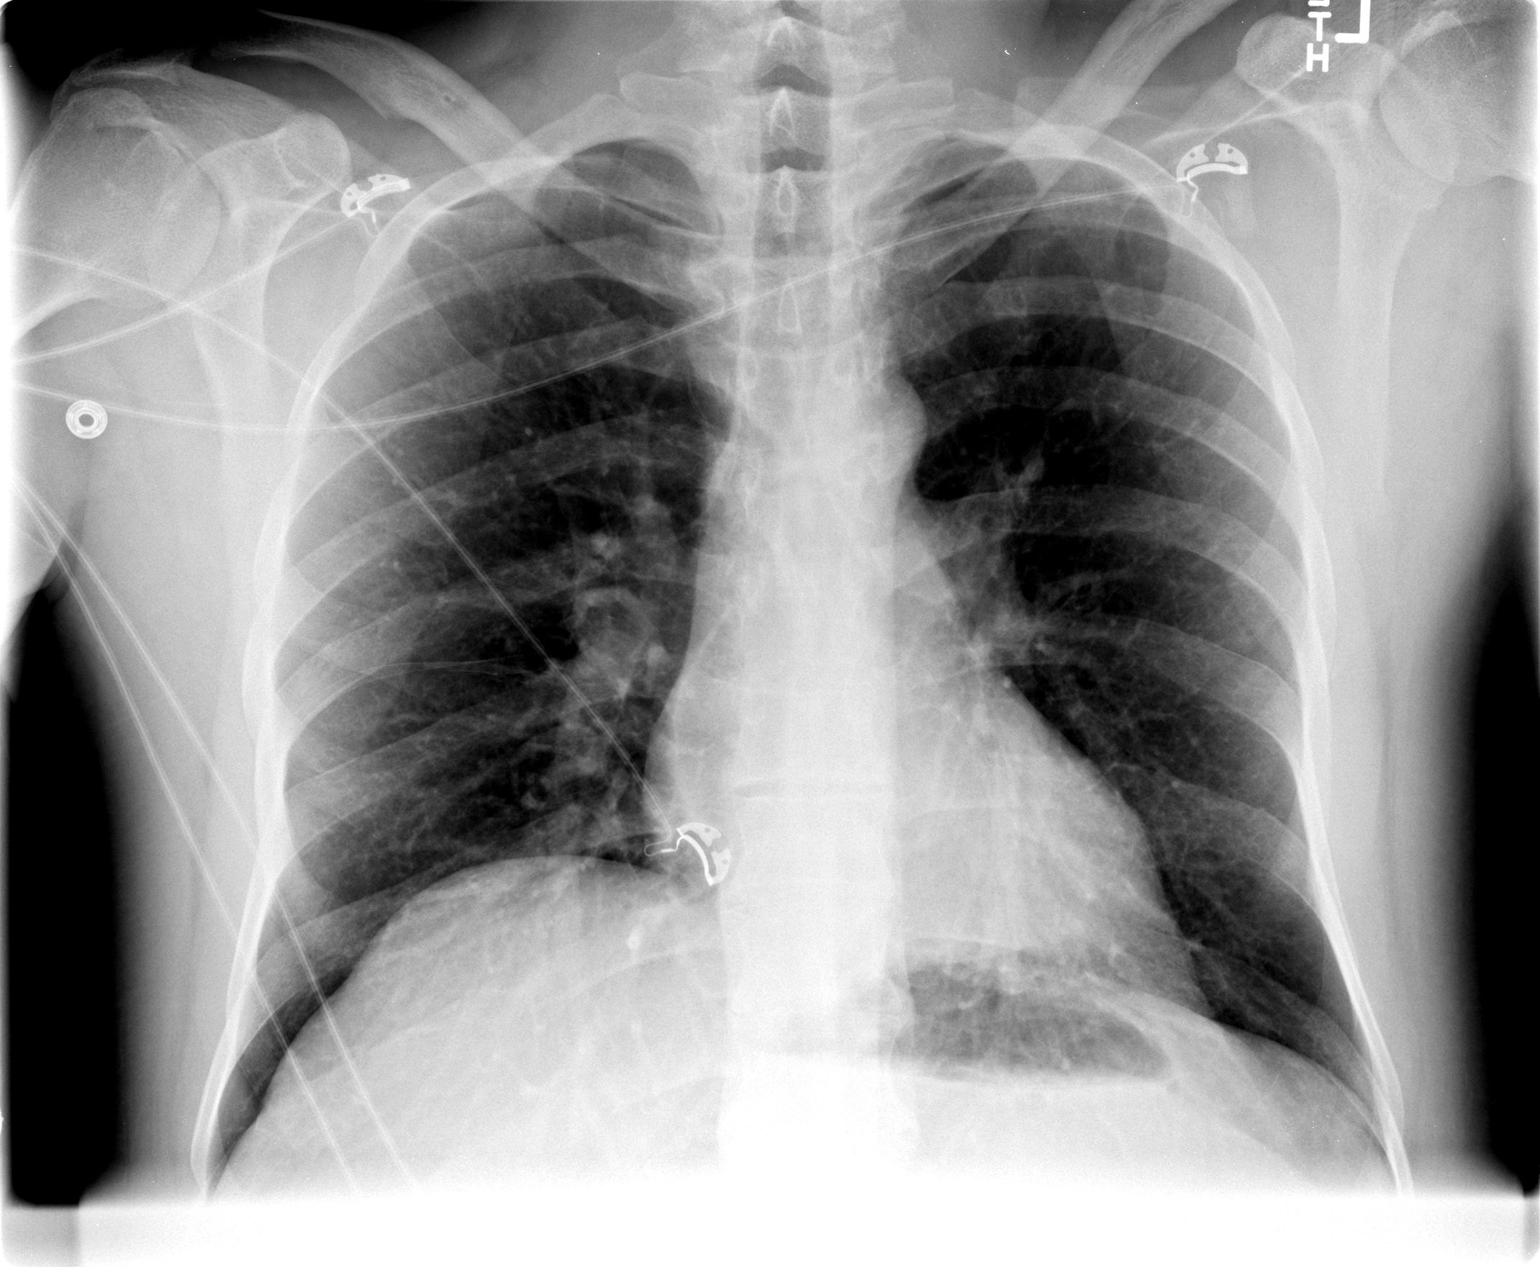

[view not recorded (2 of 2)]
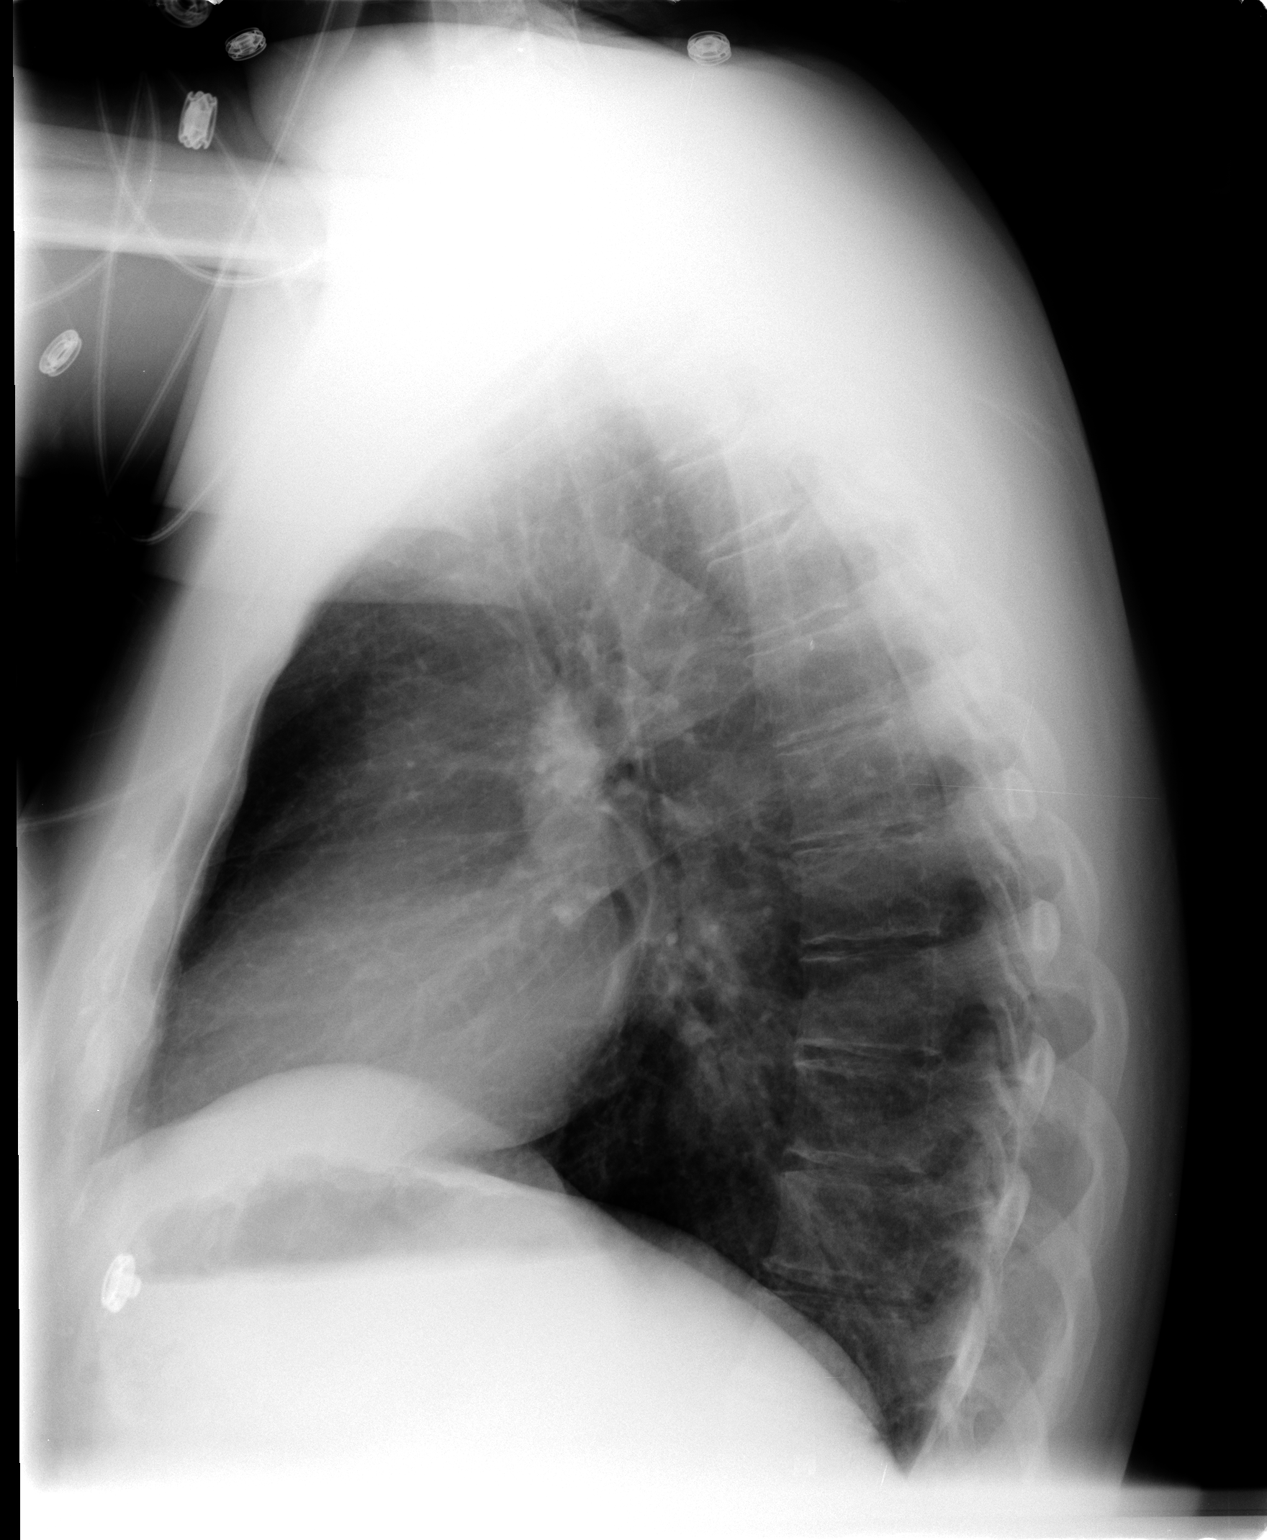

[2 of 2 positions shown; findings below may reference images not displayed]

FINDINGS: Cardiac size is within normal limits.  No evidence for
pulmonary edema.  There are no focal consolidations or pleural
effusions. Visualized osseous structures have a normal appearance.
IMPRESSION: Negative exam.

## 2012-10-04 ENCOUNTER — Other Ambulatory Visit: Payer: Self-pay | Admitting: Gastroenterology

## 2012-10-06 ENCOUNTER — Other Ambulatory Visit (HOSPITAL_COMMUNITY): Payer: Self-pay | Admitting: Gastroenterology

## 2012-10-09 ENCOUNTER — Telehealth: Payer: Self-pay | Admitting: Internal Medicine

## 2012-10-09 MED ORDER — RABEPRAZOLE SODIUM 20 MG PO TBEC: 20.0000 mg | DELAYED_RELEASE_TABLET | Freq: Every day | ORAL | Status: DC

## 2012-10-09 MED ORDER — AMITRIPTYLINE HCL 10 MG PO TABS: 10.0000 mg | ORAL_TABLET | Freq: Every day | ORAL | Status: DC

## 2012-10-09 NOTE — Telephone Encounter (Signed)
Done

## 2012-10-09 NOTE — Telephone Encounter (Signed)
Pt said that [facility] told him that he needed to call us to renew his prescriptions for Aciphex and Amitripyline ?sp He gets a 90 day supply. Please call him at [phone]

## 2012-10-09 NOTE — Telephone Encounter (Signed)
Routing to refill box  

## 2012-10-10 IMAGING — US US ABDOMEN COMPLETE
1 series · 13 of 25 positions shown · non-contrast
Comparison: CT 9113

CLINICAL DATA: Epigastric pain

COMPLETE ABDOMINAL ULTRASOUND

[Series 1: us abdomen complete · 0.31mm/px · 13 of 79 slices shown]
[im 1/79]
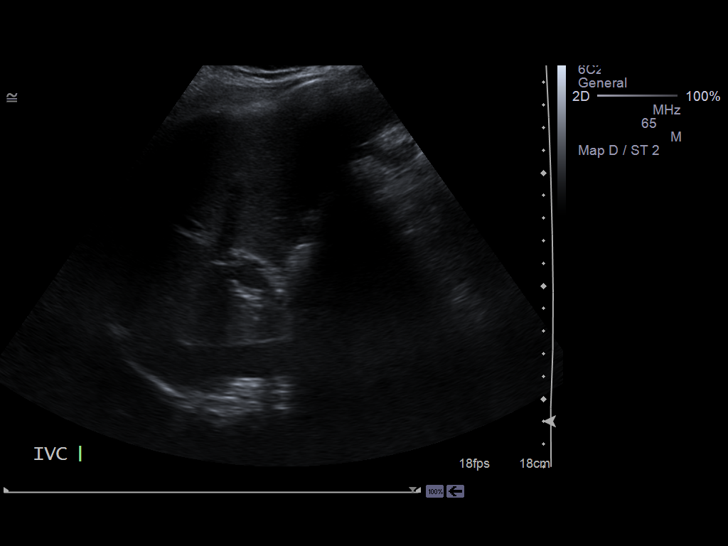
[im 7/79]
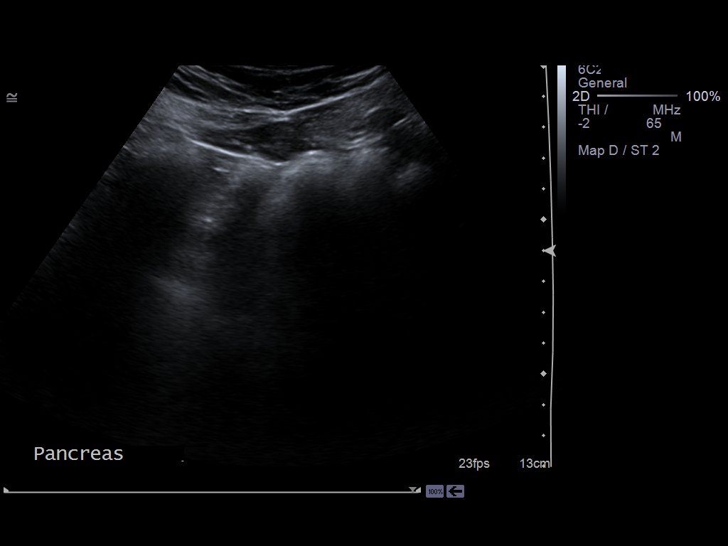
[im 14/79]
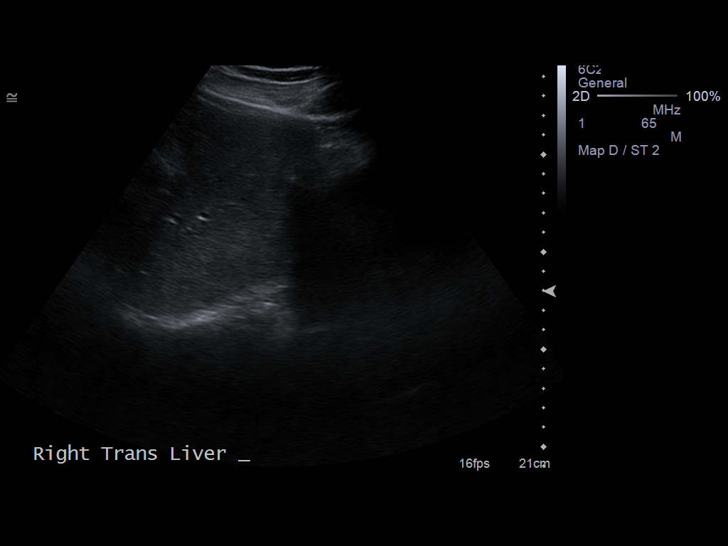
[im 20/79]
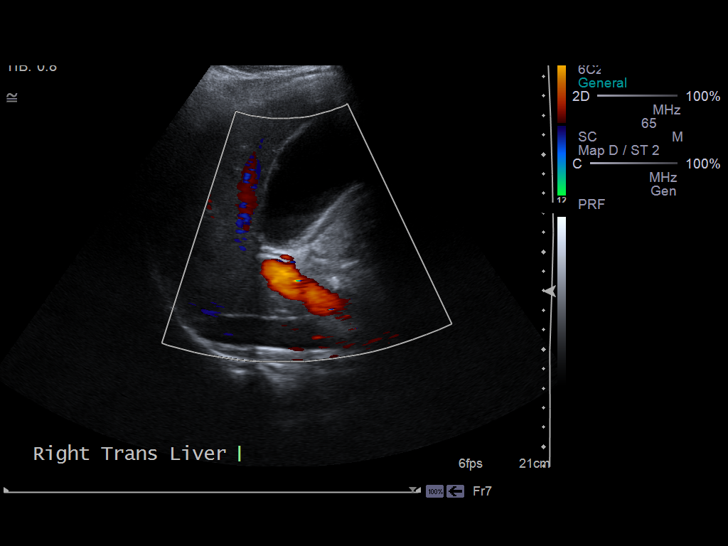
[im 27/79]
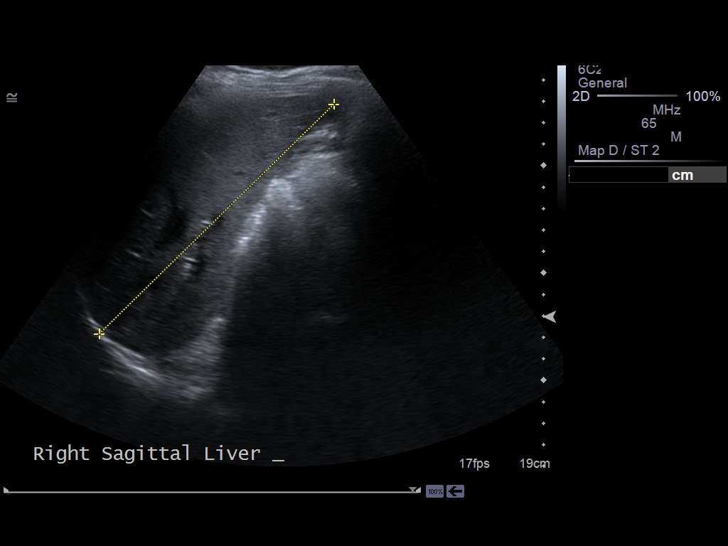
[im 33/79]
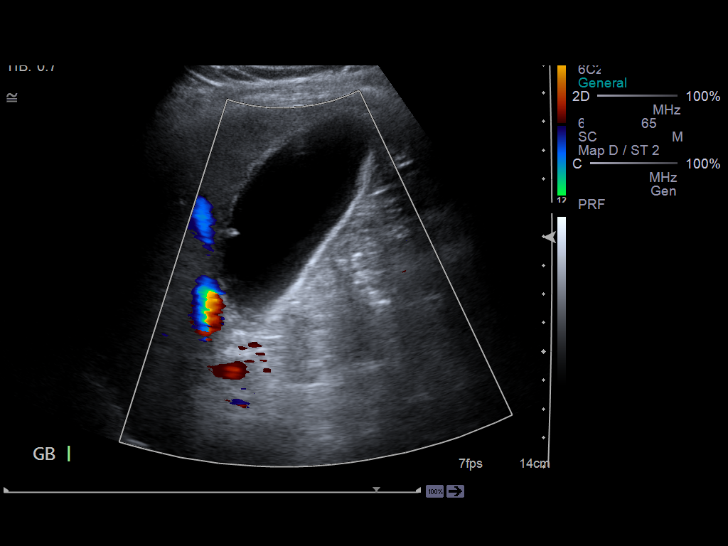
[im 40/79]
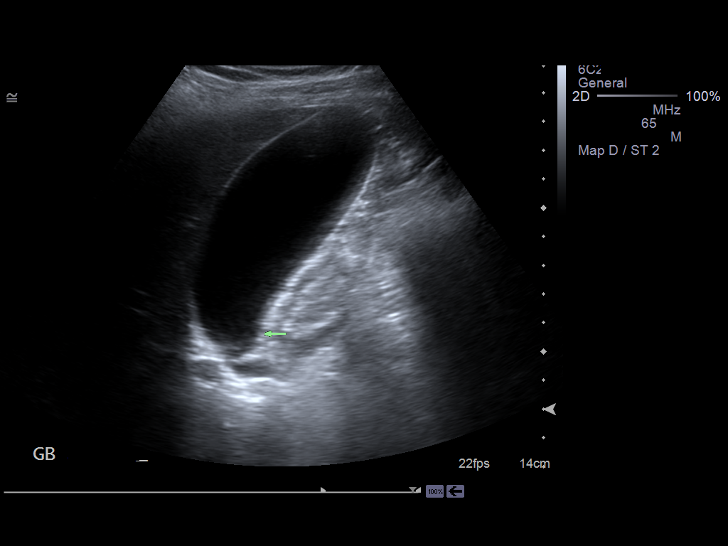
[im 46/79]
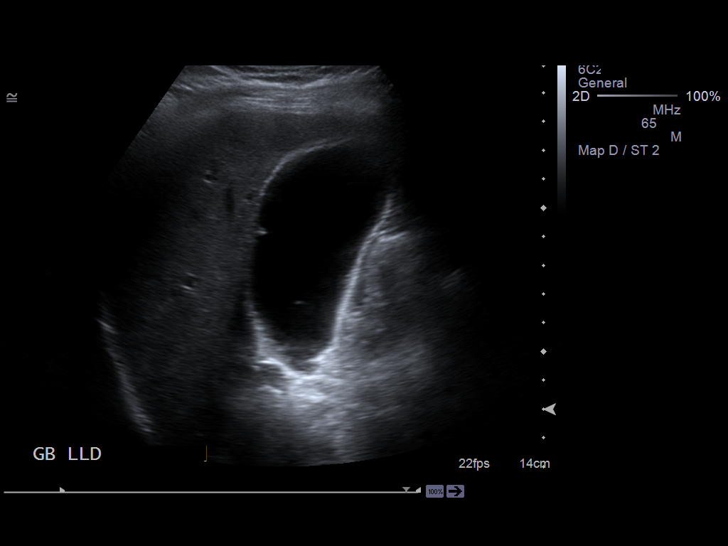
[im 53/79]
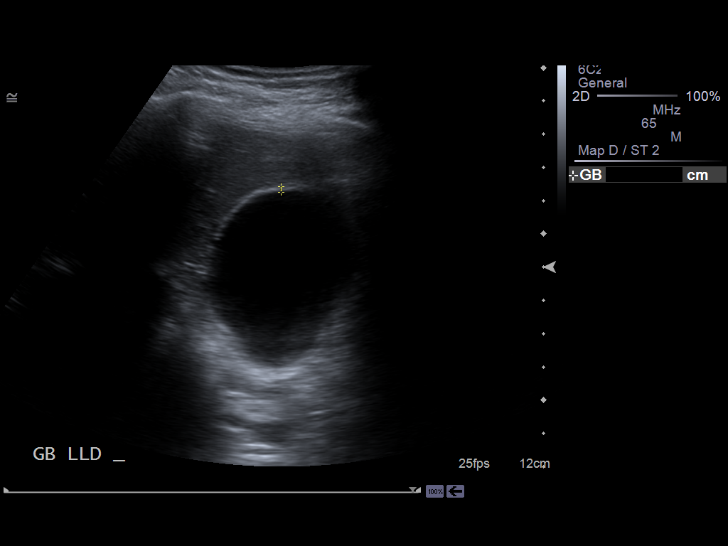
[im 59/79]
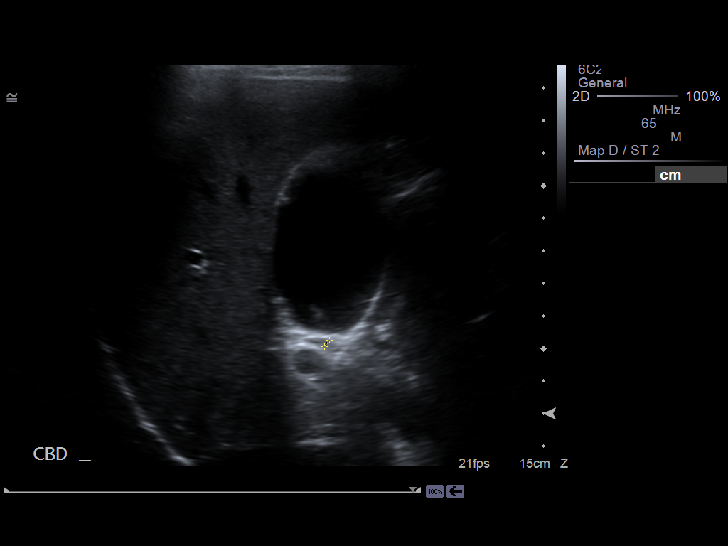
[im 66/79]
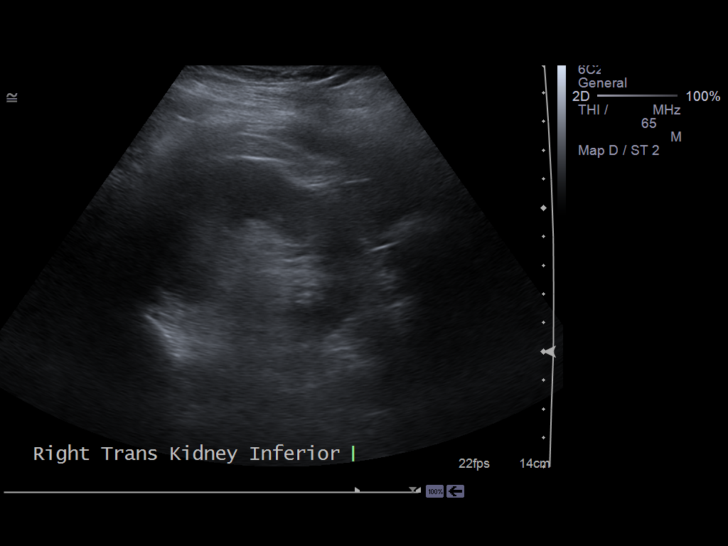
[im 72/79]
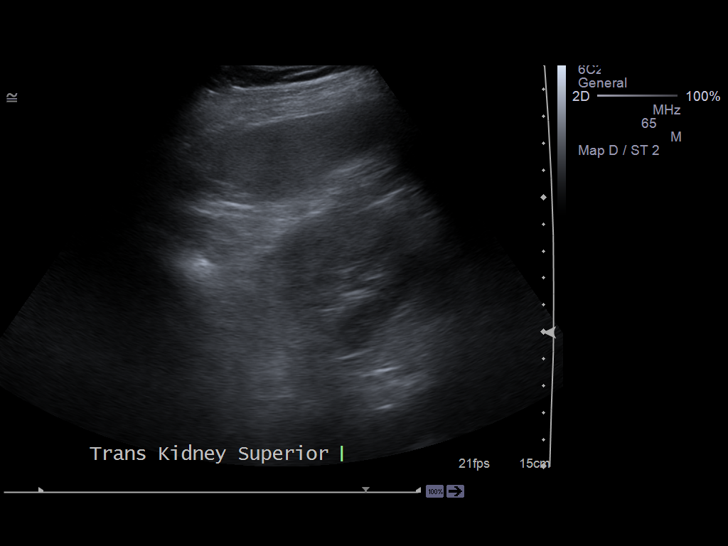
[im 79/79]
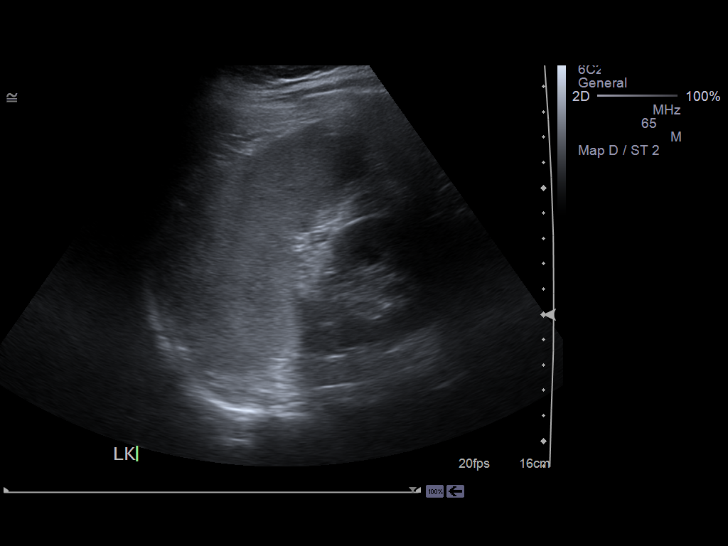

[13 of 25 positions shown; findings below may reference images not displayed]

FINDINGS: Gallbladder:  A solitary 4 mm gallbladder polyp is seen associated
with the anterior gallbladder wall.  A small amount of intraluminal
sludge is seen and a few small mobile gallstones measuring between
1and 3 mm are noted. The gallbladder wall is within normal limits
for thickness and no pericholecystic fluid is noted.  Evaluation
for a sonographic Murphy's sign is negative.

Common bile duct:  Measures 2.5 mm in diameter

Liver:  No focal lesion identified.  Within normal limits in
parenchymal echogenicity. No signs of intrahepatic ductal
dilatation are seen

IVC:  The proximal portion appears normal

Pancreas:  Is obscured by overlying gas and not assessed

Spleen:  Has a sagittal length of 9.9 cm.  No focal parenchymal
abnormality is seen

Right Kidney:  Has a sagittal length of 11.2 cm.  No focal
parenchymal abnormality or signs of hydronephrosis is seen

Left Kidney:  Has a sagittal length of 11.7 cm.  No focal
parenchymal abnormality or signs of hydronephrosis is seen

Abdominal aorta:  Has a maximal caliber of 2.2 cm with no
aneurysmal dilatation noted.
IMPRESSION: Gallbladder wall polyp, intraluminal sludge and few small mobile
gallstones.  No signs of associated cholecystitis.

Non assessed pancreas due to overlying gas.

## 2012-12-24 ENCOUNTER — Other Ambulatory Visit: Payer: Self-pay | Admitting: *Deleted

## 2012-12-24 MED ORDER — METOPROLOL TARTRATE 25 MG PO TABS: 25.0000 mg | ORAL_TABLET | Freq: Two times a day (BID) | ORAL | Status: DC

## 2012-12-24 NOTE — Telephone Encounter (Signed)
Fax Received. Refill Completed. Ayslin Kundert Chowoe (R.M.A)   

## 2013-02-23 ENCOUNTER — Other Ambulatory Visit: Payer: Self-pay | Admitting: Internal Medicine

## 2013-02-23 ENCOUNTER — Other Ambulatory Visit: Payer: Self-pay | Admitting: *Deleted

## 2013-02-23 ENCOUNTER — Telehealth: Payer: Self-pay | Admitting: Internal Medicine

## 2013-02-23 DIAGNOSIS — K824 Cholesterolosis of gallbladder: Secondary | ICD-10-CM

## 2013-02-23 DIAGNOSIS — I2510 Atherosclerotic heart disease of native coronary artery without angina pectoris: Secondary | ICD-10-CM

## 2013-02-23 MED ORDER — ATORVASTATIN CALCIUM 80 MG PO TABS: 80.0000 mg | ORAL_TABLET | Freq: Every day | ORAL | Status: DC

## 2013-02-23 MED ORDER — TICAGRELOR 90 MG PO TABS: 90.0000 mg | ORAL_TABLET | Freq: Two times a day (BID) | ORAL | Status: DC

## 2013-02-23 NOTE — Telephone Encounter (Signed)
Pt called this morning to ask when or if RMR wanted to follow up with him. He had some type of testing done last year regarding his GB and didn't know if RMR wanted to repeat it or follow up, etc. I told him that I would get with his nurse to see what RMR recommendations are and we would call him back. [phone]

## 2013-02-23 NOTE — Telephone Encounter (Signed)
Fax Received. Refill Completed. Linken Mcglothen Chowoe (R.M.A)   

## 2013-02-23 NOTE — Telephone Encounter (Signed)
Per 05/14/12 documentation- RMR wants pt to have a gallbladder u/s in mid 2014 for a gallbladder polyp.   Benedetto Goad, please schedule U/S Darl Pikes, pt will need ov after U/S- please schedule.

## 2013-02-23 NOTE — Telephone Encounter (Signed)
Fax Received. Refill Completed. Eric Fisher (R.M.A)   

## 2013-02-23 NOTE — Telephone Encounter (Signed)
Patient is scheduled for Abd U/S on Thurs May 22nd and a F/U OPV w/RMR on July 18th

## 2013-02-26 ENCOUNTER — Ambulatory Visit (HOSPITAL_COMMUNITY)
Admission: RE | Admit: 2013-02-26 | Discharge: 2013-02-26 | Disposition: A | Discharge status: Home / Self Care | Payer: BC Managed Care – PPO | Source: Ambulatory Visit | Attending: Internal Medicine | Admitting: Internal Medicine

## 2013-02-26 ENCOUNTER — Other Ambulatory Visit: Payer: Self-pay | Admitting: Internal Medicine

## 2013-02-26 DIAGNOSIS — Z09 Encounter for follow-up examination after completed treatment for conditions other than malignant neoplasm: Secondary | ICD-10-CM | POA: Insufficient documentation

## 2013-02-26 DIAGNOSIS — K824 Cholesterolosis of gallbladder: Secondary | ICD-10-CM | POA: Insufficient documentation

## 2013-03-04 NOTE — Progress Notes (Signed)
Referral has been faxed to Dr. Cherylann Ratel

## 2013-03-05 ENCOUNTER — Other Ambulatory Visit: Payer: Self-pay | Admitting: Internal Medicine

## 2013-03-05 DIAGNOSIS — K824 Cholesterolosis of gallbladder: Secondary | ICD-10-CM

## 2013-03-16 ENCOUNTER — Ambulatory Visit (INDEPENDENT_AMBULATORY_CARE_PROVIDER_SITE_OTHER): Payer: BC Managed Care – PPO | Admitting: Cardiovascular Disease

## 2013-03-16 ENCOUNTER — Encounter: Payer: Self-pay | Admitting: Cardiovascular Disease

## 2013-03-16 VITALS — BP 132/80 | HR 76 | Ht 72.0 in | Wt 192.0 lb

## 2013-03-16 DIAGNOSIS — Z7901 Long term (current) use of anticoagulants: Secondary | ICD-10-CM

## 2013-03-16 DIAGNOSIS — E785 Hyperlipidemia, unspecified: Secondary | ICD-10-CM

## 2013-03-16 DIAGNOSIS — I2510 Atherosclerotic heart disease of native coronary artery without angina pectoris: Secondary | ICD-10-CM

## 2013-03-16 LAB — BASIC METABOLIC PANEL
CO2: 25 mEq/L (ref 19–32)
Chloride: 109 mEq/L (ref 96–112)
Potassium: 3.9 mEq/L (ref 3.5–5.1)
Sodium: 141 mEq/L (ref 135–145)

## 2013-03-16 LAB — HEPATIC FUNCTION PANEL
Albumin: 4.1 g/dL (ref 3.5–5.2)
Alkaline Phosphatase: 47 U/L (ref 39–117)
Total Protein: 7.6 g/dL (ref 6.0–8.3)

## 2013-03-16 LAB — LIPID PANEL
Cholesterol: 85 mg/dL (ref 0–200)
HDL: 36.8 mg/dL — ABNORMAL LOW (ref >39.00)
LDL Cholesterol: 39 mg/dL (ref 0–99)
Triglycerides: 44 mg/dL (ref 0.0–149.0)
VLDL: 8.8 mg/dL (ref 0.0–40.0)

## 2013-03-16 NOTE — Progress Notes (Signed)
Eric Fisher Date of Birth  03-Dec-1965       [facility]    [place] [address]  [address] [place], [place]  [address]   [place], [place]  [address] [phone]     [phone]   Fax  [phone]    Fax [phone]  Problem List: 1. 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 Hyperlipidemia   History of Present Illness: Eric Fisher is a 47 year old gentleman with a history of coronary artery disease. 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 We treated with Brilinta, aspirin, and Coumadin. He's had some atypical episodes of chest pain since last saw him. None of his symptoms were similar to his presenting episodes of angina. He exercises on a daily basis and does not have any episodes of chest pain.  He has been maintained on Brilinta and Coumadin he seems to be doing fairly well. He is quite active and has not had any recurrent episodes of angina.  Dec. 9, 2013 When I last saw him several months ago we discontinued his Coumadin. We have maintained the Brilinta and ASA 81 QD. He has been able to all of his normal activities without any chest pain.  March 16, 2013:  He denies any chest pain.  He is getting some regular exercise.   Current Outpatient Prescriptions on File Prior to Visit  Medication Sig Dispense Refill  . acetaminophen (TYLENOL) 500 MG tablet Take 1,000 mg by mouth every 6 (six) hours as needed. For pain      . ALPRAZolam (XANAX) 0.5 MG tablet Take 0.5 mg by mouth 2 (two) times daily as needed. For anxiety       . amitriptyline (ELAVIL) 10 MG tablet Take 1 tablet (10 mg total) by mouth at bedtime.  90 tablet  3  . aspirin EC 81 MG EC tablet Take 1 tablet (81 mg total) by mouth daily.      Marland Kitchen atorvastatin (LIPITOR) 80 MG tablet Take 1 tablet (80 mg total) by mouth at bedtime.  90 tablet  3  . fish oil-omega-3 fatty acids 1000 MG  capsule Take 1,400 mg by mouth 4 (four) times daily.       . metoprolol tartrate (LOPRESSOR) 25 MG tablet Take 1 tablet (25 mg total) by mouth 2 (two) times daily.  180 tablet  3  . Multiple Vitamin (MULTIVITAMIN WITH MINERALS) TABS Take 1 tablet by mouth every morning.      . nitroGLYCERIN (NITROSTAT) 0.4 MG SL tablet Place 0.4 mg under the tongue every 5 (five) minutes x 3 doses as needed. Chest pain      . RABEprazole (ACIPHEX) 20 MG tablet Take 1 tablet (20 mg total) by mouth daily.  90 tablet  3  . Ticagrelor (BRILINTA) 90 MG TABS tablet Take 1 tablet (90 mg total) by mouth every 12 (twelve) hours.  180 tablet  3   No current facility-administered medications on file prior to visit.    No Known Allergies  Past Medical History  Diagnosis Date  . GERD (gastroesophageal reflux disease)   . 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 Coronary artery disease     ectatic coronaries per cath in August 2013 - managed with Brilinta and aspirin  . Anxiety   . 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 Gallstones 05/2011  on abd u/s  . Hypertension     he denies  . Complication of anesthesia   . PONV (postoperative nausea and vomiting)   . Hemorrhoid     s/p surgery August 2013    Past Surgical History  Procedure Laterality Date  . Knee arthroscopy    . Hemorroidectomy    . Transthoracic echocardiogram  03/13/2011    Left ventricle: Mild distal septal hypokinesis The cavity size was normal. Systolic function was normal. The estimated ejection fraction was 55%. Wall motion was normal  . Esophagogastroduodenoscopy  02/2008    small hh  . Bravo ph study  02/2008    Day 1, 155 episodes of acid reflux with longest episode 29 minutes, [place]ter 41.7. Day 2, 51 episodes, [place] 12.4. Two of three episodes of chest pain correlated with episode of acid reflux. Study done OFF PPI.  Marland Kitchen Esophagogastroduodenoscopy  06/19/2011    Dr. Elmer Ramp esophagus, small hiatal  hernia o/w normal stomach  . Cardiac catheterization  03/11/2011    This demonstrated marked ectasia of the proximal LAD with suggestion of a large filling defect consistent  with thrombus.  There was clot extending into the first septal   perforating branch which had slow flow.   . Cardiac catheterization  05/19/2012    Angiographic data- large and slightly ectatic left main, no stenosis or thrombi, pLAD extremely large and ectatic, no thombi, remained LAD normal; normal LCx without stenosis or ectasia; minor luminal RCA irregularities, dRCA mildly ectatic; PDA and PLSA normal size.   Marland Kitchen Hemorrhoid surgery  05/23/2012    Procedure: HEMORRHOIDECTOMY;  Surgeon: Dalia Heading, MD;  Location: [facility];  Service: General;  Laterality: N/A;    History  Smoking status  . Never Smoker   Smokeless tobacco  . Not on file    History  Alcohol Use No    Family History  Problem Relation Age of Onset  . Arrhythmia Father   . Colon cancer Neg Hx     Reviw of Systems:  Reviewed in the HPI.  All other systems are negative.  Physical Exam: Blood pressure 132/80, pulse 76, height 6' (1.829 m), weight 192 lb (87.091 kg). General: Well developed, well nourished, in no acute distress.  Head: Normocephalic, atraumatic, sclera non-icteric, mucus membranes are moist,   Neck: Supple. Carotids are 2 + without bruits. No JVD  Lungs: Clear bilaterally to auscultation.  Heart: regular rate.  normal  S1 S2. No murmurs, gallops or rubs.  Abdomen: Soft, non-tender, non-distended with normal bowel sounds. No hepatomegaly. No rebound/guarding. No masses.  Msk:  Strength and tone are normal  Extremities: No clubbing or cyanosis. No edema.  Distal pedal pulses are 2+ and equal bilaterally.  Neuro: Alert and oriented X 3. Moves all extremities spontaneously.  Psych:  Responds to questions appropriately with a normal affect.  ECG: June 9, j2014:  NSR at  74, NORMAL ECG   Assessment / Plan:

## 2013-03-16 NOTE — Assessment & Plan Note (Signed)
Will continue Brilinta and ASA

## 2013-03-16 NOTE — Assessment & Plan Note (Addendum)
Eric Fisher is doing well. He's not had any episodes of angina. He has very ectatic coronary artery disease. We treated him with aspirin and and he has done very well.  He needs to have gallbladder surgery. I've given him the okay to hold his Coronita for 5 days prior to surgery. I would like for him to continue the aspirin. He should restart the blood that day or so after surgery depending on how the surgery days.  I'll see him again in one year for followup office visit. We'll check fasting lipids, liver enzymes, and basic metabolic profile at that time.

## 2013-03-16 NOTE — Patient Instructions (Addendum)
Your physician recommends that you return for a FASTING lipid profile: today and in 6 months   Your physician wants you to follow-up in: 1 year  You will receive a reminder letter in the mail two months in advance. If you don't receive a letter, please call our office to schedule the follow-up appointment.  YOU ARE CLEARED FOR GALLBLADDER SURGERY.  YOU MAY HOLD YOUR BRILINTA FOR 5 DAYS PRIOR TO PROCEDURE,  CONTINUE TAKING ASPIRIN.  RESTART DAY AFTER SURGERY

## 2013-03-19 NOTE — H&P (Signed)
  NTS SOAP Note  Vital Signs:  Vitals as of: 03/19/2013: Systolic 127: Diastolic 79: Heart Rate 77: Temp 98.20F: Height 61ft 11in: Weight 195Lbs 0 Ounces: BMI 27.2  BMI : 27.2 kg/m2  Subjective: This 47 Years 3 Months old Male presents for a gallbladder polyp.  Has been followed by GI-Dr. Jena Gauss.  Recent ultrasound of gallbladder reveals an enlarging gallbladder polyp.  Patient is otherwise asymptomatic.   Review of Symptoms:  Constitutional:unremarkable   Head:unremarkable    Eyes:unremarkable   Nose/Mouth/Throat:unremarkable Cardiovascular:  unremarkable   Respiratory:unremarkable   Gastrointestinal:  unremarkable   Genitourinary:unremarkable     Musculoskeletal:unremarkable   Skin:unremarkable Hematolgic/Lymphatic:unremarkable     Allergic/Immunologic:unremarkable     Past Medical History:    Reviewed   Past Medical History  Surgical History: hemorrhoidectomy, knee surgery Medical Problems: MI, CAD, chronic anticoagulation Allergies: nkda Medications: atorvastatin, metoprolol, brilinta, amitriptyline, aciphex   Social History:Reviewed  Social History  Preferred Language: English ([place]) Race:  White Ethnicity: Not Hispanic / Latino Age: 47 Years 11 Months Marital Status:  S Alcohol:  No Recreational drug(s):  No   Smoking Status: Never smoker reviewed on 05/15/2012 Functional Status reviewed on mm/dd/yyyy ------------------------------------------------ Bathing: Normal Cooking: Normal Dressing: Normal Driving: Normal Eating: Normal Managing Meds: Normal Oral Care: Normal Shopping: Normal Toileting: Normal Transferring: Normal Walking: Normal Cognitive Status reviewed on mm/dd/yyyy ------------------------------------------------ Attention: Normal Decision Making: Normal Language: Normal Memory: Normal Motor: Normal Perception: Normal Problem Solving: Normal Visual and Spatial: Normal   Family  History:  Reviewed   Family Health History  Is there a family history [id number]    Objective Information: General:  Well appearing, well nourished in no distress. Neck:  Supple without lymphadenopathy.  Heart:  RRR, no murmur or gallop.  Normal S1, S2.  No S3, S4.  Lungs:    CTA bilaterally, no wheezes, rhonchi, rales.  Breathing unlabored. Abdomen:Soft, NT/ND, no HSM, no masses.  Assessment:Gallbladder polyp  Diagnosis &amp; Procedure Smart Code   Plan:Will need laparoscopic cholecystectomy once cleared by Cardiology.  Will be seeing him next week.  Need to know when to stop brilinta anticoagulant.   Patient Education:Alternative treatments to surgery were discussed with patient (and family).  Risks and benefits  of procedure including bleeding, infection, hepatobiliary injury, and the possibility of an open procedure were fully explained to the patient (and family) who gave informed consent. Patient/family questions were addressed.  Follow-up:Pending Surgery  Addendum:  Was told by cardiology to stop anticoagulation five days before procedure, restart day after surgery.

## 2013-04-02 ENCOUNTER — Encounter (HOSPITAL_COMMUNITY): Payer: Self-pay | Admitting: Pharmacy Technician

## 2013-04-09 NOTE — Patient Instructions (Addendum)
Eric Fisher  04/09/2013   Your procedure is scheduled on:   04/17/2013   Report to [facility] at  615  AM.  Call this number if you have problems the morning of surgery: [phone]   Remember:   Do not eat food or drink liquids after midnight.   Take these medicines the morning of surgery with A SIP OF WATER: xanax, aciphex, brilinta, metoprolol   Do not wear jewelry, make-up or nail polish.  Do not wear lotions, powders, or perfumes.   Do not shave 48 hours prior to surgery. Men may shave face and neck.  Do not bring valuables to the hospital.  [facility] is not responsible for any belongings or valuables.  Contacts, dentures or bridgework may not be worn into surgery.  Leave suitcase in the car. After surgery it may be brought to your room.  For patients admitted to the hospital, checkout time is 11:00 AM the day of discharge.   Patients discharged the day of surgery will not be allowed to drive home.  Name and phone number of your driver: family  Special Instructions: Shower using CHG 2 nights before surgery and the night before surgery.  If you shower the day of surgery use CHG.  Use special wash - you have one bottle of CHG for all showers.  You should use approximately 1/3 of the bottle for each shower.   Please read over the following fact sheets that you were given: Pain Booklet, Coughing and Deep Breathing, Surgical Site Infection Prevention, Anesthesia Post-op Instructions and Care and Recovery After Surgery Laparoscopic Cholecystectomy Laparoscopic cholecystectomy is surgery to remove the gallbladder. The gallbladder is located slightly to the right of center in the abdomen, behind the liver. It is a concentrating and storage sac for the bile produced in the liver. Bile aids in the digestion and absorption of fats. Gallbladder disease (cholecystitis) is an inflammation of your gallbladder. This condition is usually caused by a buildup of gallstones (cholelithiasis) in  your gallbladder. Gallstones can block the flow of bile, resulting in inflammation and pain. In severe cases, emergency surgery may be required. When emergency surgery is not required, you will have time to prepare for the procedure. Laparoscopic surgery is an alternative to open surgery. Laparoscopic surgery usually has a shorter recovery time. Your common bile duct may also need to be examined and explored. Your caregiver will discuss this with you if he or she feels this should be done. If stones are found in the common bile duct, they may be removed. LET YOUR CAREGIVER KNOW ABOUT:  Allergies to food or medicine.  Medicines taken, including vitamins, herbs, eyedrops, over-the-counter medicines, and creams.  Use of steroids (by mouth or creams).  Previous problems with anesthetics or numbing medicines.  History of bleeding problems or blood clots.  Previous surgery.  Other health problems, including diabetes and kidney problems.  Possibility of pregnancy, if this applies. RISKS AND COMPLICATIONS All surgery is associated with risks. Some problems that may occur following this procedure include:  Infection.  Damage to the common bile duct, nerves, arteries, veins, or other internal organs such as the stomach or intestines.  Bleeding.  A stone may remain in the common bile duct. BEFORE THE PROCEDURE  Do not take aspirin for 3 days prior to surgery or blood thinners for 1 week prior to surgery.  Do not eat or drink anything after midnight the night before surgery.  Let your caregiver know  if you develop a cold or other infectious problem prior to surgery.  You should be present 60 minutes before the procedure or as directed. PROCEDURE  You will be given medicine that makes you sleep (general anesthetic). When you are asleep, your surgeon will make several small cuts (incisions) in your abdomen. One of these incisions is used to insert a small, lighted scope (laparoscope) into  the abdomen. The laparoscope helps the surgeon see into your abdomen. Carbon dioxide gas will be pumped into your abdomen. The gas allows more room for the surgeon to perform your surgery. Other operating instruments are inserted through the other incisions. Laparoscopic procedures may not be appropriate when:  There is major scarring from previous surgery.  The gallbladder is extremely inflamed.  There are bleeding disorders or unexpected cirrhosis of the liver.  A pregnancy is near term.  Other conditions make the laparoscopic procedure impossible. If your surgeon feels it is not safe to continue with a laparoscopic procedure, he or she will perform an open abdominal procedure. In this case, the surgeon will make an incision to open the abdomen. This gives the surgeon a larger view and field to work within. This may allow the surgeon to perform procedures that sometimes cannot be performed with a laparoscope alone. Open surgery has a longer recovery time. AFTER THE PROCEDURE  You will be taken to the recovery area where a nurse will watch and check your progress.  You may be allowed to go home the same day.  Do not resume physical activities until directed by your caregiver.  You may resume a normal diet and activities as directed. Document Released: 09/24/2005 Document Revised: 12/17/2011 Document Reviewed: 03/09/2011 [facility] Patient Information 2014 [place], [place]. PATIENT INSTRUCTIONS POST-ANESTHESIA  IMMEDIATELY FOLLOWING SURGERY:  Do not drive or operate machinery for the first twenty four hours after surgery.  Do not make any important decisions for twenty four hours after surgery or while taking narcotic pain medications or sedatives.  If you develop intractable nausea and vomiting or a severe headache please notify your doctor immediately.  FOLLOW-UP:  Please make an appointment with your surgeon as instructed. You do not need to follow up with anesthesia unless specifically  instructed to do so.  WOUND CARE INSTRUCTIONS (if applicable):  Keep a dry clean dressing on the anesthesia/puncture wound site if there is drainage.  Once the wound has quit draining you may leave it open to air.  Generally you should leave the bandage intact for twenty four hours unless there is drainage.  If the epidural site drains for more than 36-48 hours please call the anesthesia department.  QUESTIONS?:  Please feel free to call your physician or the hospital operator if you have any questions, and they will be happy to assist you.

## 2013-04-13 ENCOUNTER — Encounter (HOSPITAL_COMMUNITY): Payer: Self-pay

## 2013-04-13 ENCOUNTER — Encounter (HOSPITAL_COMMUNITY)
Admission: RE | Admit: 2013-04-13 | Discharge: 2013-04-13 | Disposition: A | Discharge status: Home / Self Care | Payer: BC Managed Care – PPO | Source: Ambulatory Visit | Attending: General Surgery | Admitting: General Surgery

## 2013-04-13 LAB — HEPATIC FUNCTION PANEL
ALT: 40 U/L (ref 0–53)
Alkaline Phosphatase: 61 U/L (ref 39–117)
Bilirubin, Direct: 0.2 mg/dL (ref 0.0–0.3)
Indirect Bilirubin: 0.4 mg/dL (ref 0.3–0.9)
Total Bilirubin: 0.6 mg/dL (ref 0.3–1.2)

## 2013-04-13 LAB — CBC WITH DIFFERENTIAL/PLATELET
Basophils Absolute: 0 10*3/uL (ref 0.0–0.1)
Eosinophils Absolute: 0.1 10*3/uL (ref 0.0–0.7)
Eosinophils Relative: 2 % (ref 0–5)
Lymphocytes Relative: 40 % (ref 12–46)
Lymphs Abs: 2.1 10*3/uL (ref 0.7–4.0)
Neutrophils Relative %: 51 % (ref 43–77)
Platelets: 183 10*3/uL (ref 150–400)
RBC: 4.67 MIL/uL (ref 4.22–5.81)
RDW: 13.3 % (ref 11.5–15.5)
WBC: 5.2 10*3/uL (ref 4.0–10.5)

## 2013-04-13 LAB — BASIC METABOLIC PANEL
Calcium: 9.3 mg/dL (ref 8.4–10.5)
Creatinine, Ser: 0.78 mg/dL (ref 0.50–1.35)
GFR calc non Af Amer: >90 mL/min (ref >90)
Sodium: 140 mEq/L (ref 135–145)

## 2013-04-16 NOTE — OR Nursing (Signed)
Spoke with patient about Eric Fisher stated his last dose was Sunday morning 04-12-2013, instructed him do not take this med as instructed by cardiologist. I also reinforced him not to take this med on morning of surgery, may take  Xanax, aciphex, metoprolol with a sip of water. Dr. Jayme Cloud informed of cardiac history and med Brilinta.

## 2013-04-17 ENCOUNTER — Ambulatory Visit: Payer: BC Managed Care – PPO | Admitting: Internal Medicine

## 2013-04-17 ENCOUNTER — Encounter (HOSPITAL_COMMUNITY): Payer: Self-pay | Admitting: *Deleted

## 2013-04-17 ENCOUNTER — Encounter (HOSPITAL_COMMUNITY)
Admission: RE | Disposition: A | Discharge status: Home / Self Care | Payer: Self-pay | Source: Ambulatory Visit | Attending: General Surgery

## 2013-04-17 ENCOUNTER — Ambulatory Visit (HOSPITAL_COMMUNITY): Payer: BC Managed Care – PPO | Admitting: Anesthesiology

## 2013-04-17 ENCOUNTER — Ambulatory Visit (HOSPITAL_COMMUNITY)
Admission: RE | Admit: 2013-04-17 | Discharge: 2013-04-17 | Disposition: A | Discharge status: Home / Self Care | Payer: BC Managed Care – PPO | Source: Ambulatory Visit | Attending: General Surgery | Admitting: General Surgery

## 2013-04-17 ENCOUNTER — Encounter (HOSPITAL_COMMUNITY): Payer: Self-pay | Admitting: Anesthesiology

## 2013-04-17 DIAGNOSIS — K824 Cholesterolosis of gallbladder: Secondary | ICD-10-CM | POA: Insufficient documentation

## 2013-04-17 DIAGNOSIS — Z7901 Long term (current) use of anticoagulants: Secondary | ICD-10-CM | POA: Insufficient documentation

## 2013-04-17 DIAGNOSIS — I10 Essential (primary) hypertension: Secondary | ICD-10-CM | POA: Insufficient documentation

## 2013-04-17 DIAGNOSIS — K8020 Calculus of gallbladder without cholecystitis without obstruction: Secondary | ICD-10-CM | POA: Insufficient documentation

## 2013-04-17 HISTORY — PX: CHOLECYSTECTOMY: SHX55

## 2013-04-17 SURGERY — LAPAROSCOPIC CHOLECYSTECTOMY
Anesthesia: General | Site: Abdomen | Wound class: Clean Contaminated

## 2013-04-17 MED ORDER — SUCCINYLCHOLINE CHLORIDE 20 MG/ML IJ SOLN
INTRAMUSCULAR | Status: AC
  Filled 2013-04-17: qty 1

## 2013-04-17 MED ORDER — ONDANSETRON HCL 4 MG/2ML IJ SOLN
4.0000 mg | Freq: Once | INTRAMUSCULAR | Status: AC
  Administered 2013-04-17: 4 mg via INTRAVENOUS

## 2013-04-17 MED ORDER — BUPIVACAINE HCL (PF) 0.5 % IJ SOLN
INTRAMUSCULAR | Status: AC
  Filled 2013-04-17: qty 30

## 2013-04-17 MED ORDER — PROPOFOL 10 MG/ML IV BOLUS
INTRAVENOUS | Status: DC | PRN
  Administered 2013-04-17: 180 mg via INTRAVENOUS

## 2013-04-17 MED ORDER — FENTANYL CITRATE 0.05 MG/ML IJ SOLN
INTRAMUSCULAR | Status: AC
  Filled 2013-04-17: qty 5

## 2013-04-17 MED ORDER — KETOROLAC TROMETHAMINE 30 MG/ML IJ SOLN
INTRAMUSCULAR | Status: AC
  Filled 2013-04-17: qty 1

## 2013-04-17 MED ORDER — BUPIVACAINE HCL (PF) 0.5 % IJ SOLN
INTRAMUSCULAR | Status: DC | PRN
  Administered 2013-04-17: 10 mL

## 2013-04-17 MED ORDER — KETOROLAC TROMETHAMINE 30 MG/ML IJ SOLN
30.0000 mg | Freq: Once | INTRAMUSCULAR | Status: AC
  Administered 2013-04-17: 30 mg via INTRAVENOUS

## 2013-04-17 MED ORDER — CLINDAMYCIN PHOSPHATE 900 MG/50ML IV SOLN
INTRAVENOUS | Status: AC
  Filled 2013-04-17: qty 50

## 2013-04-17 MED ORDER — ONDANSETRON HCL 4 MG/2ML IJ SOLN: 4.0000 mg | Freq: Once | INTRAMUSCULAR | Status: DC | PRN

## 2013-04-17 MED ORDER — FENTANYL CITRATE 0.05 MG/ML IJ SOLN
INTRAMUSCULAR | Status: DC | PRN
  Administered 2013-04-17: 100 ug via INTRAVENOUS
  Administered 2013-04-17 (×2): 50 ug via INTRAVENOUS

## 2013-04-17 MED ORDER — CLINDAMYCIN PHOSPHATE 900 MG/50ML IV SOLN
900.0000 mg | Freq: Once | INTRAVENOUS | Status: AC
  Administered 2013-04-17: 900 mg via INTRAVENOUS

## 2013-04-17 MED ORDER — PROPOFOL 10 MG/ML IV EMUL
INTRAVENOUS | Status: AC
  Filled 2013-04-17: qty 20

## 2013-04-17 MED ORDER — DEXAMETHASONE SODIUM PHOSPHATE 4 MG/ML IJ SOLN
4.0000 mg | Freq: Once | INTRAMUSCULAR | Status: AC
  Administered 2013-04-17: 4 mg via INTRAVENOUS

## 2013-04-17 MED ORDER — ROCURONIUM BROMIDE 100 MG/10ML IV SOLN
INTRAVENOUS | Status: DC | PRN
  Administered 2013-04-17: 5 mg via INTRAVENOUS
  Administered 2013-04-17: 25 mg via INTRAVENOUS

## 2013-04-17 MED ORDER — DEXAMETHASONE SODIUM PHOSPHATE 4 MG/ML IJ SOLN
INTRAMUSCULAR | Status: AC
  Filled 2013-04-17: qty 1

## 2013-04-17 MED ORDER — ONDANSETRON HCL 4 MG/2ML IJ SOLN
INTRAMUSCULAR | Status: AC
  Filled 2013-04-17: qty 2

## 2013-04-17 MED ORDER — SCOPOLAMINE 1 MG/3DAYS TD PT72
1.0000 | MEDICATED_PATCH | Freq: Once | TRANSDERMAL | Status: DC
  Administered 2013-04-17: 1.5 mg via TRANSDERMAL

## 2013-04-17 MED ORDER — ROCURONIUM BROMIDE 50 MG/5ML IV SOLN
INTRAVENOUS | Status: AC
  Filled 2013-04-17: qty 1

## 2013-04-17 MED ORDER — SUCCINYLCHOLINE CHLORIDE 20 MG/ML IJ SOLN
INTRAMUSCULAR | Status: DC | PRN
  Administered 2013-04-17: 120 mg via INTRAVENOUS

## 2013-04-17 MED ORDER — CHLORHEXIDINE GLUCONATE 4 % EX LIQD: 1.0000 "application " | Freq: Once | CUTANEOUS | Status: DC

## 2013-04-17 MED ORDER — OXYCODONE-ACETAMINOPHEN 7.5-325 MG PO TABS: 1.0000 | ORAL_TABLET | ORAL | Status: AC | PRN

## 2013-04-17 MED ORDER — SODIUM CHLORIDE 0.9 % IR SOLN
Status: DC | PRN
  Administered 2013-04-17: 1000 mL

## 2013-04-17 MED ORDER — NEOSTIGMINE METHYLSULFATE 1 MG/ML IJ SOLN
INTRAMUSCULAR | Status: DC | PRN
  Administered 2013-04-17: 3 mg via INTRAVENOUS

## 2013-04-17 MED ORDER — GLYCOPYRROLATE 0.2 MG/ML IJ SOLN
INTRAMUSCULAR | Status: DC | PRN
  Administered 2013-04-17: .5 mg via INTRAVENOUS

## 2013-04-17 MED ORDER — LIDOCAINE HCL (PF) 1 % IJ SOLN
INTRAMUSCULAR | Status: AC
  Filled 2013-04-17: qty 5

## 2013-04-17 MED ORDER — FENTANYL CITRATE 0.05 MG/ML IJ SOLN: 25.0000 ug | INTRAMUSCULAR | Status: DC | PRN

## 2013-04-17 MED ORDER — HEMOSTATIC AGENTS (NO CHARGE) OPTIME
TOPICAL | Status: DC | PRN
  Administered 2013-04-17: 1 via TOPICAL

## 2013-04-17 MED ORDER — ENOXAPARIN SODIUM 40 MG/0.4ML ~~LOC~~ SOLN
40.0000 mg | Freq: Once | SUBCUTANEOUS | Status: AC
  Administered 2013-04-17: 40 mg via SUBCUTANEOUS

## 2013-04-17 MED ORDER — MIDAZOLAM HCL 2 MG/2ML IJ SOLN
1.0000 mg | INTRAMUSCULAR | Status: DC | PRN
  Administered 2013-04-17: 2 mg via INTRAVENOUS

## 2013-04-17 MED ORDER — MIDAZOLAM HCL 2 MG/2ML IJ SOLN
INTRAMUSCULAR | Status: AC
  Filled 2013-04-17: qty 2

## 2013-04-17 MED ORDER — LIDOCAINE HCL (CARDIAC) 20 MG/ML IV SOLN
INTRAVENOUS | Status: DC | PRN
  Administered 2013-04-17: 40 mg via INTRAVENOUS

## 2013-04-17 MED ORDER — SCOPOLAMINE 1 MG/3DAYS TD PT72
MEDICATED_PATCH | TRANSDERMAL | Status: AC
  Filled 2013-04-17: qty 1

## 2013-04-17 MED ORDER — LACTATED RINGERS IV SOLN
INTRAVENOUS | Status: DC
  Administered 2013-04-17: 75 mL/h via INTRAVENOUS
  Administered 2013-04-17: 07:00:00 via INTRAVENOUS

## 2013-04-17 MED ORDER — ENOXAPARIN SODIUM 40 MG/0.4ML ~~LOC~~ SOLN
SUBCUTANEOUS | Status: AC
  Filled 2013-04-17: qty 0.4

## 2013-04-17 SURGICAL SUPPLY — 36 items
APPLIER CLIP LAPSCP 10X32 DD (CLIP) ×2 IMPLANT
BAG HAMPER (MISCELLANEOUS) ×2 IMPLANT
CLOTH BEACON ORANGE TIMEOUT ST (SAFETY) ×2 IMPLANT
COVER LIGHT HANDLE STERIS (MISCELLANEOUS) ×4 IMPLANT
DECANTER SPIKE VIAL GLASS SM (MISCELLANEOUS) ×2 IMPLANT
DURAPREP 26ML APPLICATOR (WOUND CARE) ×2 IMPLANT
ELECT REM PT RETURN 9FT ADLT (ELECTROSURGICAL) ×2
ELECTRODE REM PT RTRN 9FT ADLT (ELECTROSURGICAL) ×1 IMPLANT
FILTER SMOKE EVAC LAPAROSHD (FILTER) ×2 IMPLANT
FORMALIN 10 PREFIL 120ML (MISCELLANEOUS) ×2 IMPLANT
GLOVE BIO SURGEON STRL SZ7.5 (GLOVE) ×2 IMPLANT
GLOVE ECLIPSE 6.5 STRL STRAW (GLOVE) ×2 IMPLANT
GLOVE INDICATOR 7.0 STRL GRN (GLOVE) ×6 IMPLANT
GLOVE INDICATOR 7.5 STRL GRN (GLOVE) ×2 IMPLANT
GLOVE SS BIOGEL STRL SZ 6.5 (GLOVE) ×1 IMPLANT
GLOVE SUPERSENSE BIOGEL SZ 6.5 (GLOVE) ×1
GOWN STRL REIN XL XLG (GOWN DISPOSABLE) ×8 IMPLANT
HEMOSTAT SNOW SURGICEL 2X4 (HEMOSTASIS) ×2 IMPLANT
INST SET LAPROSCOPIC AP (KITS) ×2 IMPLANT
IV NS IRRIG 3000ML ARTHROMATIC (IV SOLUTION) IMPLANT
KIT ROOM TURNOVER APOR (KITS) ×2 IMPLANT
KIT TROCAR LAP CHOLE (TROCAR) ×2 IMPLANT
MANIFOLD NEPTUNE II (INSTRUMENTS) ×2 IMPLANT
NS IRRIG 1000ML POUR BTL (IV SOLUTION) ×2 IMPLANT
PACK LAP CHOLE LZT030E (CUSTOM PROCEDURE TRAY) ×2 IMPLANT
PAD ARMBOARD 7.5X6 YLW CONV (MISCELLANEOUS) ×2 IMPLANT
POUCH SPECIMEN RETRIEVAL 10MM (ENDOMECHANICALS) ×2 IMPLANT
SET BASIN LINEN APH (SET/KITS/TRAYS/PACK) ×2 IMPLANT
SET TUBE IRRIG SUCTION NO TIP (IRRIGATION / IRRIGATOR) IMPLANT
SPONGE GAUZE 2X2 8PLY STRL LF (GAUZE/BANDAGES/DRESSINGS) ×8 IMPLANT
STAPLER VISISTAT (STAPLE) ×2 IMPLANT
SUT VICRYL 0 UR6 27IN ABS (SUTURE) ×2 IMPLANT
TAPE CLOTH SURG 4X10 WHT LF (GAUZE/BANDAGES/DRESSINGS) ×2 IMPLANT
TUBING INSUFFLATION (TUBING) ×2 IMPLANT
WARMER LAPAROSCOPE (MISCELLANEOUS) ×2 IMPLANT
YANKAUER SUCT 12FT TUBE ARGYLE (SUCTIONS) ×2 IMPLANT

## 2013-04-17 NOTE — Transfer of Care (Signed)
Immediate Anesthesia Transfer of Care Note  Patient: Eric Fisher  Procedure(s) Performed: Procedure(s): LAPAROSCOPIC CHOLECYSTECTOMY (N/A)  Patient Location: PACU  Anesthesia Type:General  Level of Consciousness: awake  Airway & Oxygen Therapy: Patient Spontanous Breathing  Post-op Assessment: Report given to PACU RN  Post vital signs: Reviewed  Complications: No apparent anesthesia complications

## 2013-04-17 NOTE — Interval H&P Note (Signed)
History and Physical Interval Note:  04/17/2013 7:20 AM  Eric Fisher  has presented today for surgery, with the diagnosis of gallbladder polyp  The various methods of treatment have been discussed with the patient and family. After consideration of risks, benefits and other options for treatment, the patient has consented to  Procedure(s): LAPAROSCOPIC CHOLECYSTECTOMY (N/A) as a surgical intervention .  The patient's history has been reviewed, patient examined, no change in status, stable for surgery.  I have reviewed the patient's chart and labs.  Questions were answered to the patient's satisfaction.     Franky Macho A

## 2013-04-17 NOTE — Anesthesia Postprocedure Evaluation (Signed)
  Anesthesia Post-op Note  Patient: Eric Fisher  Procedure(s) Performed: Procedure(s): LAPAROSCOPIC CHOLECYSTECTOMY (N/A)  Patient Location: PACU  Anesthesia Type:General  Level of Consciousness: awake, alert  and oriented  Airway and Oxygen Therapy: Patient Spontanous Breathing and Patient connected to face mask oxygen  Post-op Pain: none  Post-op Assessment: Post-op Vital signs reviewed, Patient's Cardiovascular Status Stable, Respiratory Function Stable, Patent Airway and No signs of Nausea or vomiting  Post-op Vital Signs: Reviewed and stable  Complications: No apparent anesthesia complications

## 2013-04-17 NOTE — Progress Notes (Signed)
Awake. Diet ginger-ale given to drink. Tolerated well. Denies pain.

## 2013-04-17 NOTE — Anesthesia Procedure Notes (Signed)
Procedure Name: Intubation Date/Time: 04/17/2013 7:40 AM Performed by: Glynn Octave E Pre-anesthesia Checklist: Patient identified, Patient being monitored, Timeout performed, Emergency Drugs available and Suction available Patient Re-evaluated:Patient Re-evaluated prior to inductionOxygen Delivery Method: Circle System Utilized Preoxygenation: Pre-oxygenation with 100% oxygen Intubation Type: IV induction Ventilation: Mask ventilation without difficulty Laryngoscope Size: Mac and 3 Grade View: Grade II Tube type: Oral Tube size: 7.0 mm Number of attempts: 1 Airway Equipment and Method: stylet and Stylet Placement Confirmation: ETT inserted through vocal cords under direct vision,  positive ETCO2 and breath sounds checked- equal and bilateral Secured at: 21 cm Tube secured with: Tape Dental Injury: Teeth and Oropharynx as per pre-operative assessment

## 2013-04-17 NOTE — Anesthesia Preprocedure Evaluation (Addendum)
Anesthesia Evaluation  Patient identified by MRN, date of birth, ID band Patient awake    Reviewed: Allergy & Precautions, H&P , NPO status , Patient's Chart, lab work & pertinent test results, reviewed documented beta blocker date and time   History of Anesthesia Complications (+) PONV  Airway Mallampati: I TM Distance: >3 FB Neck ROM: Full    Dental no notable dental hx.    Pulmonary neg pulmonary ROS,    Pulmonary exam normal       Cardiovascular hypertension, Pt. on medications and Pt. on home beta blockers + CAD and + Past MI Rhythm:Regular Rate:Normal     Neuro/Psych Anxiety negative neurological ROS     GI/Hepatic Neg liver ROS, GERD-  Medicated and Controlled,  Endo/Other  negative endocrine ROS  Renal/GU negative Renal ROS     Musculoskeletal negative musculoskeletal ROS (+)   Abdominal Normal abdominal exam  (+)   Peds  Hematology negative hematology ROS (+)   Anesthesia Other Findings   Reproductive/Obstetrics                           Anesthesia Physical Anesthesia Plan  ASA: III  Anesthesia Plan: General   Post-op Pain Management:    Induction: Intravenous, Rapid sequence and Cricoid pressure planned  Airway Management Planned: Oral ETT  Additional Equipment:   Intra-op Plan:   Post-operative Plan: Extubation in OR  Informed Consent: I have reviewed the patients History and Physical, chart, labs and discussed the procedure including the risks, benefits and alternatives for the proposed anesthesia with the patient or authorized representative who has indicated his/her understanding and acceptance.     Plan Discussed with: CRNA  Anesthesia Plan Comments:        Anesthesia Quick Evaluation

## 2013-04-17 NOTE — Op Note (Signed)
Patient:  Eric Fisher  DOB:  1966-08-20  MRN:  [id number]   Preop Diagnosis:  Biliary colic, gallbladder polyp  Postop Diagnosis:  Same  Procedure:  Laparoscopic cholecystectomy  Surgeon:  Franky Macho, M.D.  Anes:  General endotracheal  Indications:  Patient is a 47 year old white male who presents with biliary colic and a gallbladder polyp. The risks and benefits of the procedure including bleeding, infection, hepatobiliary injury, the possibly of an open procedure were fully explained to the patient, who gave informed consent. The patient has been off his anticoagulant for 5 days.  Procedure note:  The patient is placed the supine position. After induction of general endotracheal anesthesia, the abdomen was prepped and draped using usual sterile technique with DuraPrep. Surgical site confirmation was performed.  A supraumbilical incision was made down to the fascia. A Veress needle was introduced into the abdominal cavity and confirmation of placement was done using the saline drop test. The abdomen was then insufflated to 16 mm mercury pressure. An 11 mm trocar was introduced into the abdominal cavity under direct visualization without difficulty. The patient was placed in reverse Trendelenburg position and additional 11 mm trocar was placed the epigastric region and 5 mm trochars were placed the right upper quadrant and right flank regions. The liver was inspected and noted within normal limits. Multiple adhesions of omentum and bowel were noted to the gallbladder. These were freed away sharply without difficulty. The gallbladder was then retracted in a dynamic fashion in order to expose the triangle of Calot. The cystic duct was first identified. Its juncture to the infundibulum was fully identified. Endoclips were placed proximally distally on the cystic duct, and the cystic duct was divided. This was likewise done to the cystic artery. The gallbladder was then freed away from the  gallbladder fossa using Bovie electrocautery. The gallbladder was delivered through the epigastric trocar site using an Endo Catch bag. The gallbladder fossa was inspected and no abnormal bleeding or bile leakage was noted. Surgicel is placed the gallbladder fossa. All fluid and air were then evacuated from the abdominal cavity prior to removal of the trochars.  All wounds were irrigated with normal saline. All wounds were injected with 0.5% Sensorcaine. The supraumbilical fascia as well as epigastric fascia were reapproximated using 0 Vicryl interrupted sutures. All skin incisions were closed using staples. Betadine ointment and dry sterile dressings were applied.  All tape and needle counts were correct the end of the procedure. Patient was extubated in the operating room and transferred to PACU in stable condition.  Complications:  None  EBL:  Minimal  Specimen:  Gallbladder

## 2013-04-20 ENCOUNTER — Encounter (HOSPITAL_COMMUNITY): Payer: Self-pay | Admitting: General Surgery

## 2013-05-19 ENCOUNTER — Ambulatory Visit (INDEPENDENT_AMBULATORY_CARE_PROVIDER_SITE_OTHER): Payer: BC Managed Care – PPO | Admitting: Internal Medicine

## 2013-05-19 ENCOUNTER — Encounter: Payer: Self-pay | Admitting: Internal Medicine

## 2013-05-19 VITALS — BP 123/77 | HR 99 | Temp 97.4°F | Ht 71.0 in | Wt 196.4 lb

## 2013-05-19 DIAGNOSIS — K219 Gastro-esophageal reflux disease without esophagitis: Secondary | ICD-10-CM

## 2013-05-19 MED ORDER — AMITRIPTYLINE HCL 10 MG PO TABS: 10.0000 mg | ORAL_TABLET | Freq: Every day | ORAL | Status: DC

## 2013-05-19 MED ORDER — RABEPRAZOLE SODIUM 20 MG PO TBEC: 20.0000 mg | DELAYED_RELEASE_TABLET | Freq: Every day | ORAL | Status: DC

## 2013-05-19 NOTE — Progress Notes (Signed)
Primary Care Physician:  Kari Baars, MD Primary Gastroenterologist:  Dr.Alto Gandolfo  Pre-Procedure History & Physical: HPI:  Eric Fisher is a 47 y.o. male here for followup of GERD. Doing very well on AcipHex 20 mg daily. Also takes low-dose amitriptyline 10 mg at bedtime for a probable occasional esophageal spasm. Tolerating this regimen very well he feels well no abdominal pain nausea vomiting. No dysphagia. Bowel function is good. He'll be due for his first routine screening colonoscopy at age 39-in 6 years. EGD test well demonstrated a normal esophagus  -  small hiatal hernia. Coumadin has been discontinued by his cardiologist. He continues on Brilinta. In the last year he's had a thrombosed hemorrhoid taken care of by Dr. Lovell Sheehan as well as cholecystectomy for enlarging gallbladder polyp (pathology benign).   Past Medical History  Diagnosis Date  . GERD (gastroesophageal reflux disease)   . 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 Coronary artery disease     ectatic coronaries per cath in August 2013 - managed with Brilinta and aspirin  . Anxiety   . 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 Gallstones 05/2011    on abd u/s  . Hypertension     he denies  . Complication of anesthesia   . PONV (postoperative nausea and vomiting)   . Hemorrhoid     s/p surgery August 2013    Past Surgical History  Procedure Laterality Date  . Knee arthroscopy    . Hemorroidectomy    . Transthoracic echocardiogram  03/13/2011    Left ventricle: Mild distal septal hypokinesis The cavity size was normal. Systolic function was normal. The estimated ejection fraction was 55%. Wall motion was normal  . Esophagogastroduodenoscopy  02/2008    small hh  . Bravo ph study  02/2008    Day 1, 155 episodes of acid reflux with longest episode 29 minutes, DeMeester 41.7. Day 2, 51 episodes, DeMeester 12.4. Two of three episodes of chest pain correlated with episode of acid reflux. Study  done OFF PPI.  Marland Kitchen Esophagogastroduodenoscopy  06/19/2011    Dr. Elmer Ramp esophagus, small hiatal hernia o/w normal stomach  . Cardiac catheterization  03/11/2011    This demonstrated marked ectasia of the proximal LAD with suggestion of a large filling defect consistent  with thrombus.  There was clot extending into the first septal   perforating branch which had slow flow.   . Cardiac catheterization  05/19/2012    Angiographic data- large and slightly ectatic left main, no stenosis or thrombi, pLAD extremely large and ectatic, no thombi, remained LAD normal; normal LCx without stenosis or ectasia; minor luminal RCA irregularities, dRCA mildly ectatic; PDA and PLSA normal size.   Marland Kitchen Hemorrhoid surgery  05/23/2012    Procedure: HEMORRHOIDECTOMY;  Surgeon: Dalia Heading, MD;  Location: [facility];  Service: General;  Laterality: N/A;  . Cholecystectomy N/A 04/17/2013    Procedure: LAPAROSCOPIC CHOLECYSTECTOMY;  Surgeon: Dalia Heading, MD;  Location: [facility];  Service: General;  Laterality: N/A;    Prior to Admission medications   Medication Sig Start Date End Date Taking? Authorizing Provider  acetaminophen (TYLENOL) 500 MG tablet Take 1,000 mg by mouth every 6 (six) hours as needed. For pain   Yes Historical Provider, MD  ALPRAZolam Prudy Feeler) 0.5 MG tablet Take 0.5 mg by mouth 2 (two) times daily as needed. For anxiety  05/03/11  Yes Historical Provider, MD  amitriptyline (ELAVIL) 10 MG  tablet Take 1 tablet (10 mg total) by mouth at bedtime. 10/09/12  Yes Nira Retort, NP  aspirin EC 81 MG EC tablet Take 1 tablet (81 mg total) by mouth daily. 05/19/12 05/19/13 Yes Roger A Arguello, PA-C  atorvastatin (LIPITOR) 80 MG tablet Take 1 tablet (80 mg total) by mouth at bedtime. 02/23/13  Yes [name], MD  fish oil-omega-3 fatty acids 1000 MG capsule Take 1,400 mg by mouth 4 (four) times daily.    Yes Historical Provider, MD  metoprolol tartrate (LOPRESSOR) 25 MG tablet Take 1 tablet (25 mg total) by mouth 2  (two) times daily. 12/24/12  Yes [name], MD  Multiple Vitamin (MULTIVITAMIN WITH MINERALS) TABS Take 1 tablet by mouth every morning.   Yes Historical Provider, MD  nitroGLYCERIN (NITROSTAT) 0.4 MG SL tablet Place 0.4 mg under the tongue every 5 (five) minutes x 3 doses as needed. Chest pain   Yes Historical Provider, MD  oxyCODONE-acetaminophen (PERCOCET) 7.5-325 MG per tablet Take 1-2 tablets by mouth every 4 (four) hours as needed for pain. 04/17/13 04/17/14 Yes Dalia Heading, MD  RABEprazole (ACIPHEX) 20 MG tablet Take 1 tablet (20 mg total) by mouth daily. 10/09/12  Yes Nira Retort, NP  Ticagrelor (BRILINTA) 90 MG TABS tablet Take 1 tablet (90 mg total) by mouth every 12 (twelve) hours. 02/23/13  Yes [name], MD    Allergies as of 05/19/2013  . (No Known Allergies)    Family History  Problem Relation Age of Onset  . Arrhythmia Father   . Colon cancer Neg Hx     History   Social History  . Marital Status: Single    Spouse Name: N/A    Number of Children: 0  . Years of Education: N/A   Occupational History  . maintenance [employer]   Social History Main Topics  . Smoking status: Never Smoker   . Smokeless tobacco: Not on file  . Alcohol Use: No  . Drug Use: No  . Sexually Active: Not Currently   Other Topics Concern  . Not on file   Social History Narrative  . No narrative on file    Review of Systems: See HPI, otherwise negative ROS  Physical Exam: BP 123/77  Pulse 99  Temp(Src) 97.4 F (36.3 C) (Oral)  Ht 5\' 11"  (1.803 m)  Wt 196 lb 6.4 oz (89.086 kg)  BMI 27.4 kg/m2 General:   Alert,  Well-developed, well-nourished, pleasant and cooperative in NAD Skin:  Intact without significant lesions or rashes. Eyes:  Sclera clear, no icterus.   Conjunctiva pink. Ears:  Normal auditory acuity. Nose:  No deformity, discharge,  or lesions. Mouth:  No deformity or lesions. Neck:  Supple; no masses or thyromegaly. No significant cervical  adenopathy. Lungs:  Clear throughout to auscultation.   No wheezes, crackles, or rhonchi. No acute distress. Heart:  Regular rate and rhythm; no murmurs, clicks, rubs,  or gallops. Abdomen: Non-distended, normal bowel sounds.  Soft and nontender without appreciable mass or hepatosplenomegaly.  Pulses:  Normal pulses noted. Extremities:  Without clubbing or edema.  Impression/Plan:  47 year old gentleman with GERD doing well AcipHex and low-dose amitriptyline. I recommend he continue that regimen. We talked about the risk and benefits of long-term acid suppression therapy. In this setting, I feel the benefits of acid suppression therapy outweigh the risks.   Recommendations:     Continue AcipHex and amitriptyline. Unless something comes up, plan to see to see this nice  gentleman back in one year.

## 2013-05-19 NOTE — Patient Instructions (Signed)
Continue Aciphex and Amitryptilline  Office visit here in 1 year

## 2013-08-17 ENCOUNTER — Other Ambulatory Visit: Payer: Self-pay | Admitting: *Deleted

## 2013-08-17 DIAGNOSIS — E785 Hyperlipidemia, unspecified: Secondary | ICD-10-CM

## 2013-08-17 DIAGNOSIS — I2510 Atherosclerotic heart disease of native coronary artery without angina pectoris: Secondary | ICD-10-CM

## 2013-08-17 NOTE — Progress Notes (Signed)
Pt to have future labs

## 2013-09-23 ENCOUNTER — Other Ambulatory Visit (INDEPENDENT_AMBULATORY_CARE_PROVIDER_SITE_OTHER): Payer: BC Managed Care – PPO

## 2013-09-23 DIAGNOSIS — I2510 Atherosclerotic heart disease of native coronary artery without angina pectoris: Secondary | ICD-10-CM

## 2013-09-23 DIAGNOSIS — E785 Hyperlipidemia, unspecified: Secondary | ICD-10-CM

## 2013-09-23 LAB — LIPID PANEL
Cholesterol: 78 mg/dL (ref 0–200)
Triglycerides: 58 mg/dL (ref 0.0–149.0)

## 2013-09-23 LAB — HEPATIC FUNCTION PANEL
AST: 31 U/L (ref 0–37)
Albumin: 4.5 g/dL (ref 3.5–5.2)
Alkaline Phosphatase: 52 U/L (ref 39–117)
Total Protein: 7.3 g/dL (ref 6.0–8.3)

## 2013-09-23 LAB — BASIC METABOLIC PANEL
Calcium: 9.2 mg/dL (ref 8.4–10.5)
Chloride: 107 mEq/L (ref 96–112)
Creatinine, Ser: 0.7 mg/dL (ref 0.4–1.5)
GFR: 121.41 mL/min (ref >60.00)

## 2013-09-29 ENCOUNTER — Telehealth: Payer: Self-pay | Admitting: *Deleted

## 2013-09-29 DIAGNOSIS — I2510 Atherosclerotic heart disease of native coronary artery without angina pectoris: Secondary | ICD-10-CM

## 2013-09-29 DIAGNOSIS — E785 Hyperlipidemia, unspecified: Secondary | ICD-10-CM

## 2013-09-29 NOTE — Telephone Encounter (Signed)
PT WILL COME FOR A NMR TOMORROW TO SEE IF HE CAN REDUCE HIS LIPITOR FROM 80 MG TO 40 MG PER DR NAHSER.

## 2013-09-29 NOTE — Telephone Encounter (Signed)
Message copied by Antony Odea on Tue Sep 29, 2013  1:44 PM ------      Message from: [name]      Created: Sun Sep 27, 2013  7:44 PM       Labs look ok       ------

## 2013-09-30 ENCOUNTER — Other Ambulatory Visit: Payer: BC Managed Care – PPO

## 2013-09-30 DIAGNOSIS — I2510 Atherosclerotic heart disease of native coronary artery without angina pectoris: Secondary | ICD-10-CM

## 2013-09-30 DIAGNOSIS — E785 Hyperlipidemia, unspecified: Secondary | ICD-10-CM

## 2013-09-30 LAB — NMR LIPOPROFILE WITHOUT LIPIDS
HDL Particle Number: 32.7 umol/L (ref >=30.5)
HDL Size: 9.5 nm (ref >=9.2)
LDL Particle Number: 501 nmol/L (ref <1000)
LDL Size: 19.7 nm — ABNORMAL LOW (ref >20.5)
LP-IR Score: <25 (ref <=45)
Large HDL-P: 6.4 umol/L (ref >=4.8)
Large VLDL-P: <0.8 nmol/L (ref <=2.7)
Small LDL Particle Number: 312 nmol/L (ref <=527)
VLDL Size: 42.7 nm (ref <=46.6)

## 2013-10-05 ENCOUNTER — Telehealth: Payer: Self-pay | Admitting: *Deleted

## 2013-10-05 DIAGNOSIS — E785 Hyperlipidemia, unspecified: Secondary | ICD-10-CM

## 2013-10-05 DIAGNOSIS — I2510 Atherosclerotic heart disease of native coronary artery without angina pectoris: Secondary | ICD-10-CM

## 2013-10-05 MED ORDER — ATORVASTATIN CALCIUM 40 MG PO TABS: 40.0000 mg | ORAL_TABLET | Freq: Every day | ORAL | Status: DC

## 2013-10-05 NOTE — Telephone Encounter (Signed)
Message copied by Antony Odea on Mon Oct 05, 2013  9:27 AM ------      Message from: [name]      Created: Sat Oct 03, 2013  4:06 PM       Lipids are great - perhaps lower than we need.  OK to go down on atorvastatin to 40 mg a day.  Recheck. Labs in 6 months.       ------

## 2013-10-05 NOTE — Telephone Encounter (Signed)
Pt aware of med reduction/ 6 month lab date given.

## 2014-01-03 ENCOUNTER — Other Ambulatory Visit: Payer: Self-pay | Admitting: Cardiovascular Disease

## 2014-01-23 ENCOUNTER — Other Ambulatory Visit: Payer: Self-pay | Admitting: Cardiovascular Disease

## 2014-03-12 ENCOUNTER — Other Ambulatory Visit: Payer: Self-pay | Admitting: Cardiovascular Disease

## 2014-03-15 ENCOUNTER — Other Ambulatory Visit (INDEPENDENT_AMBULATORY_CARE_PROVIDER_SITE_OTHER): Payer: BC Managed Care – PPO

## 2014-03-15 DIAGNOSIS — E785 Hyperlipidemia, unspecified: Secondary | ICD-10-CM

## 2014-03-15 DIAGNOSIS — I2510 Atherosclerotic heart disease of native coronary artery without angina pectoris: Secondary | ICD-10-CM

## 2014-03-15 LAB — LIPID PANEL
Cholesterol: 92 mg/dL (ref 0–200)
HDL: 34.9 mg/dL — ABNORMAL LOW (ref >39.00)
LDL CALC: 43 mg/dL (ref 0–99)
NonHDL: 57.1
TRIGLYCERIDES: 69 mg/dL (ref 0.0–149.0)
Total CHOL/HDL Ratio: 3
VLDL: 13.8 mg/dL (ref 0.0–40.0)

## 2014-03-15 LAB — BASIC METABOLIC PANEL
BUN: 11 mg/dL (ref 6–23)
CHLORIDE: 108 meq/L (ref 96–112)
CO2: 28 mEq/L (ref 19–32)
Calcium: 9 mg/dL (ref 8.4–10.5)
Creatinine, Ser: 0.8 mg/dL (ref 0.4–1.5)
GFR: 110.73 mL/min (ref >60.00)
Glucose, Bld: 98 mg/dL (ref 70–99)
POTASSIUM: 3.9 meq/L (ref 3.5–5.1)
Sodium: 139 mEq/L (ref 135–145)

## 2014-03-16 LAB — NMR LIPOPROFILE WITH LIPIDS
CHOLESTEROL, TOTAL: 90 mg/dL (ref <200)
HDL PARTICLE NUMBER: 32.3 umol/L (ref >=30.5)
HDL Size: 9.1 nm — ABNORMAL LOW (ref >=9.2)
HDL-C: 37 mg/dL — ABNORMAL LOW (ref >=40)
LDL CALC: 36 mg/dL (ref <100)
LDL PARTICLE NUMBER: 513 nmol/L (ref <1000)
LDL Size: 19.9 nm — ABNORMAL LOW (ref >20.5)
LP-IR SCORE: 56 — AB (ref <=45)
Large HDL-P: 5.8 umol/L (ref >=4.8)
Large VLDL-P: 1.8 nmol/L (ref <=2.7)
Small LDL Particle Number: 365 nmol/L (ref <=527)
TRIGLYCERIDES: 86 mg/dL (ref <150)
VLDL SIZE: 55.7 nm — AB (ref <=46.6)

## 2014-03-18 ENCOUNTER — Encounter: Payer: Self-pay | Admitting: Cardiovascular Disease

## 2014-03-18 ENCOUNTER — Ambulatory Visit (INDEPENDENT_AMBULATORY_CARE_PROVIDER_SITE_OTHER): Payer: BC Managed Care – PPO | Admitting: Cardiovascular Disease

## 2014-03-18 VITALS — BP 110/86 | HR 62 | Ht 73.0 in | Wt 192.0 lb

## 2014-03-18 DIAGNOSIS — I2510 Atherosclerotic heart disease of native coronary artery without angina pectoris: Secondary | ICD-10-CM

## 2014-03-18 LAB — HEPATIC FUNCTION PANEL
ALBUMIN: 4.3 g/dL (ref 3.5–5.2)
ALT: 37 U/L (ref 0–53)
AST: 31 U/L (ref 0–37)
Alkaline Phosphatase: 52 U/L (ref 39–117)
BILIRUBIN DIRECT: 0.1 mg/dL (ref 0.0–0.3)
Total Bilirubin: 0.9 mg/dL (ref 0.2–1.2)
Total Protein: 7.3 g/dL (ref 6.0–8.3)

## 2014-03-18 NOTE — Progress Notes (Signed)
Eric Fisher Date of Birth  1966-08-29       [facility] [address], [place], [place]  [address]   [place], [place]  [address] [phone]     [phone]   Fax  [phone]    Fax [phone]  Problem List: 1. 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 Hyperlipidemia   History of Present Illness: Eric Fisher is a 48 year old gentleman with a history of coronary artery disease. 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 We treated with Brilinta, aspirin, and Coumadin. He's had some atypical episodes of chest pain since last saw him. None of his symptoms were similar to his presenting episodes of angina. He exercises on a daily basis and does not have any episodes of chest pain.  He has been maintained on Brilinta and Coumadin he seems to be doing fairly well. He is quite active and has not had any recurrent episodes of angina.  Dec. 9, 2013 When I last saw him several months ago we discontinued his Coumadin. We have maintained the Brilinta and ASA 81 QD. He has been able to all of his normal activities without any chest pain.  March 16, 2013:  He denies any chest pain.  He is getting some regular exercise.  March 18, 2014:  Eric Fisher is doing very well. He's not had any episodes of chest pain or shortness breath. He has remained very active.  He's been very busy at work. He's looking forward to getting back into an exercise regimen this summer.   Current Outpatient Prescriptions on File Prior to Visit  Medication Sig Dispense Refill  . acetaminophen (TYLENOL) 500 MG tablet Take 1,000 mg by mouth every 6 (six) hours as needed. For pain      . ALPRAZolam (XANAX) 0.5 MG tablet Take 0.5 mg by mouth 2 (two) times daily as needed. For anxiety       . amitriptyline (ELAVIL) 10 MG tablet Take 1 tablet (10 mg total) by mouth at bedtime.   90 tablet  3  . atorvastatin (LIPITOR) 40 MG tablet Take 1 tablet (40 mg total) by mouth at bedtime.  90 tablet  3  . BRILINTA 90 MG TABS tablet TAKE 1 TABLET EVERY 12 HOURS  3 tablet  2  . fish oil-omega-3 fatty acids 1000 MG capsule Take 2,000 g by mouth daily.       . metoprolol tartrate (LOPRESSOR) 25 MG tablet TAKE 1 TABLET TWICE A DAY  180 tablet  0  . Multiple Vitamin (MULTIVITAMIN WITH MINERALS) TABS Take 1 tablet by mouth every morning.      . nitroGLYCERIN (NITROSTAT) 0.4 MG SL tablet Place 0.4 mg under the tongue every 5 (five) minutes x 3 doses as needed. Chest pain      . oxyCODONE-acetaminophen (PERCOCET) 7.5-325 MG per tablet Take 1-2 tablets by mouth every 4 (four) hours as needed for pain.  40 tablet  0  . RABEprazole (ACIPHEX) 20 MG tablet Take 1 tablet (20 mg total) by mouth daily.  90 tablet  3   No current facility-administered medications on file prior to visit.    No Known Allergies  Past Medical History  Diagnosis Date  . GERD (gastroesophageal reflux disease)   . 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 Coronary artery disease  ectatic coronaries per cath in August 2013 - managed with Brilinta and aspirin  . Anxiety   . 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 Gallstones 05/2011    on abd u/s  . Hypertension     he denies  . Complication of anesthesia   . PONV (postoperative nausea and vomiting)   . Hemorrhoid     s/p surgery August 2013    Past Surgical History  Procedure Laterality Date  . Knee arthroscopy    . Hemorroidectomy    . Transthoracic echocardiogram  03/13/2011    Left ventricle: Mild distal septal hypokinesis The cavity size was normal. Systolic function was normal. The estimated ejection fraction was 55%. Wall motion was normal  . Esophagogastroduodenoscopy  02/2008    small hh  . Bravo ph study  02/2008    Day 1, 155 episodes of acid reflux with longest episode 29 minutes, DeMeester 41.7. Day 2, 51 episodes,  [place]eester 12.4. Two of three episodes of chest pain correlated with episode of acid reflux. Study done OFF PPI.  Marland Kitchen Esophagogastroduodenoscopy  06/19/2011    Dr. Vivi Ferns esophagus, small hiatal hernia o/w normal stomach  . Cardiac catheterization  03/11/2011    This demonstrated marked ectasia of the proximal LAD with suggestion of a large filling defect consistent  with thrombus.  There was clot extending into the first septal   perforating branch which had slow flow.   . Cardiac catheterization  05/19/2012    Angiographic data- large and slightly ectatic left main, no stenosis or thrombi, pLAD extremely large and ectatic, no thombi, remained LAD normal; normal LCx without stenosis or ectasia; minor luminal RCA irregularities, dRCA mildly ectatic; PDA and PLSA normal size.   Marland Kitchen Hemorrhoid surgery  05/23/2012    Procedure: HEMORRHOIDECTOMY;  Surgeon: Jamesetta So, MD;  Location: [facility];  Service: General;  Laterality: N/A;  . Cholecystectomy N/A 04/17/2013    Procedure: LAPAROSCOPIC CHOLECYSTECTOMY;  Surgeon: Jamesetta So, MD;  Location: [facility];  Service: General;  Laterality: N/A;    History  Smoking status  . Never Smoker   Smokeless tobacco  . Not on file    History  Alcohol Use No    Family History  Problem Relation Age of Onset  . Arrhythmia Father   . Colon cancer Neg Hx     Reviw of Systems:  Reviewed in the HPI.  All other systems are negative.  Physical Exam: Blood pressure 110/86, pulse 62, height 6\' 1"  (1.854 m), weight 192 lb (87.091 kg). General: Well developed, well nourished, in no acute distress.  Head: Normocephalic, atraumatic, sclera non-icteric, mucus membranes are moist,   Neck: Supple. Carotids are 2 + without bruits. No JVD  Lungs: Clear bilaterally to auscultation.  Heart: regular rate.  normal  S1 S2. No murmurs, gallops or rubs.  Abdomen: Soft, non-tender, non-distended with normal bowel sounds. No hepatomegaly. No rebound/guarding. No  masses.  Msk:  Strength and tone are normal  Extremities: No clubbing or cyanosis. No edema.  Distal pedal pulses are 2+ and equal bilaterally.  Neuro: Alert and oriented X 3. Moves all extremities spontaneously.  Psych:  Responds to questions appropriately with a normal affect.  ECG: March 18, 2014:  NSR at 62,  Normal ECG  Assessment / Plan:

## 2014-03-18 NOTE — Patient Instructions (Signed)
Your physician recommends that you continue on your current medications as directed. Please refer to the Current Medication list given to you today.  Lab Today: Lft  Your physician wants you to follow-up in: 1 year You will receive a reminder letter in the mail two months in advance. If you don't receive a letter, please call our office to schedule the follow-up appointment.

## 2014-03-18 NOTE — Assessment & Plan Note (Addendum)
He has ectatic CAD -he was on aspirin, Coumadin,and Brilinta  for approximately one year. We did discontinue the Coumadin. He now is on Brilinta alone.   We discussed starting ASA and Plavix  .  We would want to draw a P2Y12 level to ensure that his platelets are inhibited.    I will see him in 1 year for return visit

## 2014-03-19 ENCOUNTER — Telehealth: Payer: Self-pay | Admitting: Cardiovascular Disease

## 2014-03-19 NOTE — Telephone Encounter (Signed)
New Message:  Pt is requesting a call back to hear his recent lab tests

## 2014-03-19 NOTE — Progress Notes (Signed)
Quick Note:  Patient notified of lab results. Patient verbalized understanding and appreciation of the phone call. ______

## 2014-03-19 NOTE — Telephone Encounter (Signed)
Patient notified of lab results. Patient verbalized understanding and appreciation of the phone call.

## 2014-03-22 ENCOUNTER — Other Ambulatory Visit: Payer: Self-pay | Admitting: *Deleted

## 2014-03-22 MED ORDER — NITROGLYCERIN 0.4 MG SL SUBL: 0.4000 mg | SUBLINGUAL_TABLET | SUBLINGUAL | Status: DC | PRN

## 2014-04-05 ENCOUNTER — Other Ambulatory Visit: Payer: BC Managed Care – PPO

## 2014-05-12 ENCOUNTER — Other Ambulatory Visit: Payer: Self-pay | Admitting: Internal Medicine

## 2014-06-11 ENCOUNTER — Other Ambulatory Visit: Payer: Self-pay | Admitting: Cardiovascular Disease

## 2014-07-08 IMAGING — US US ABDOMEN LIMITED
1 series · 14 of 25 positions shown · non-contrast
Comparison: 06/01/2011

CLINICAL DATA: Follow-up polyp

LIMITED RIGHT UPPER QUADRANT ULTRASOUND

[Series 1: us abdomen limited · 0.30mm/px · 14 of 54 slices shown]
[im 1/54]
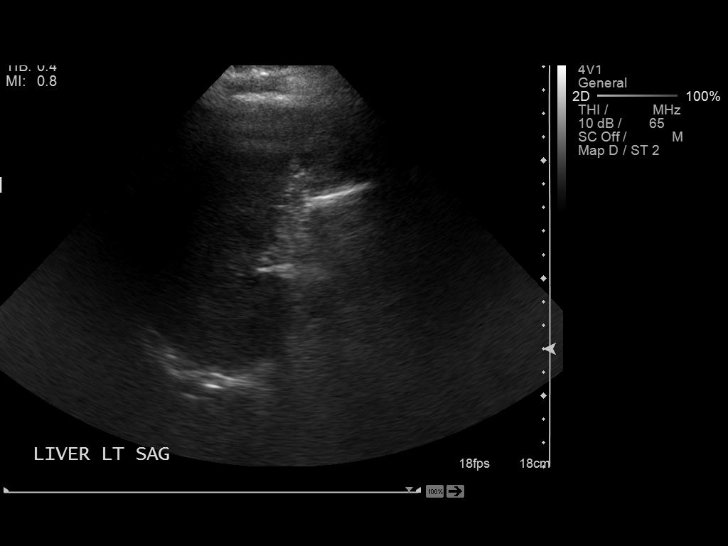
[im 5/54]
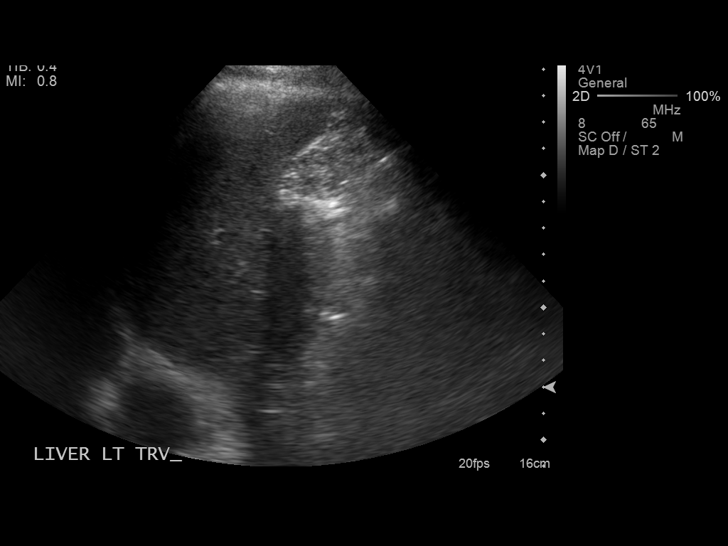
[im 9/54]
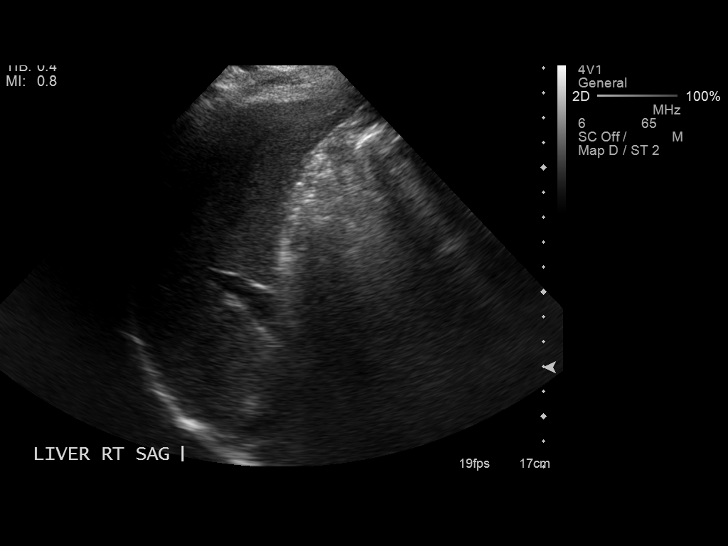
[im 14/54]
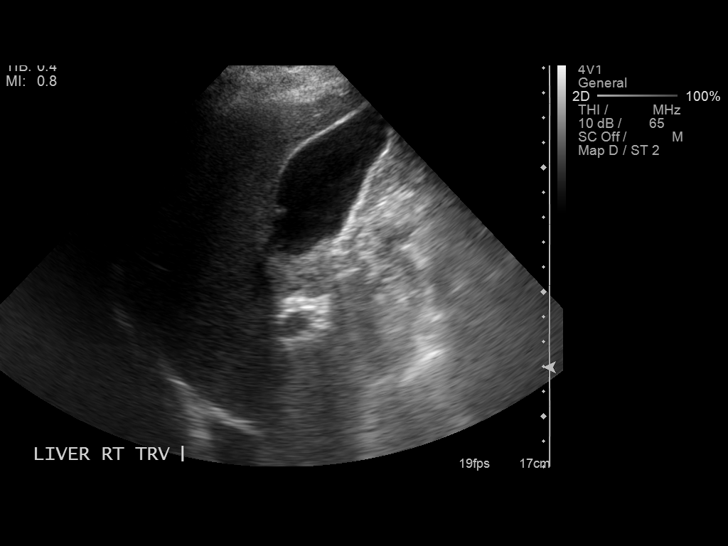
[im 18/54]
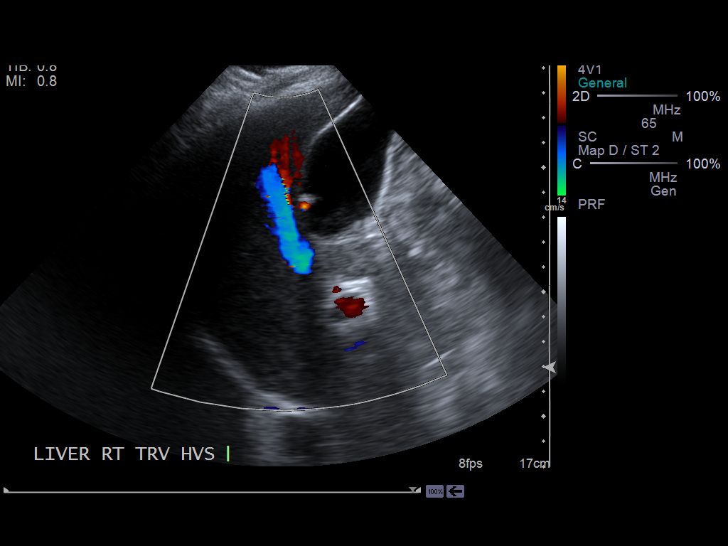
[im 20/54]
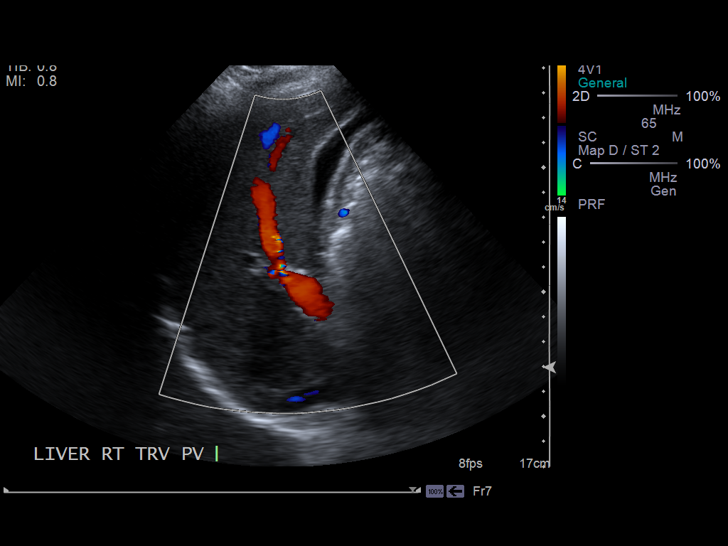
[im 25/54]
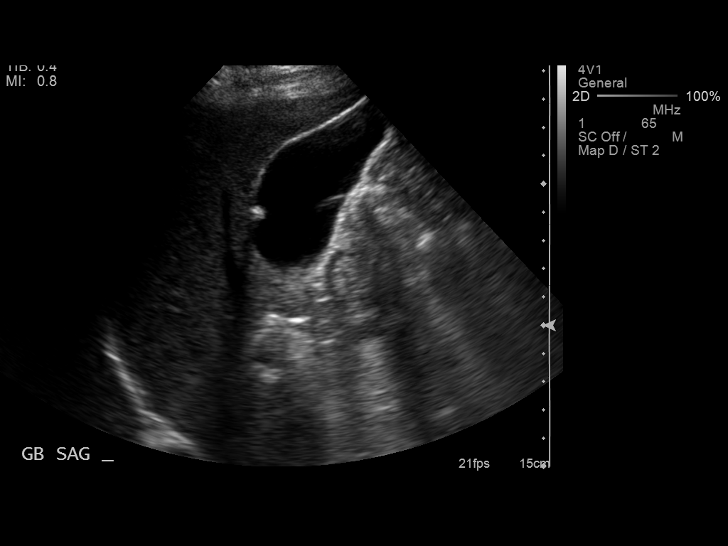
[im 29/54]
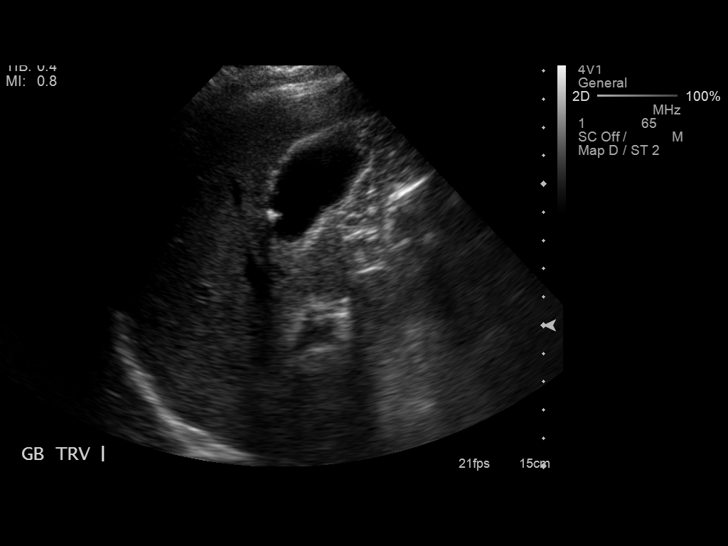
[im 34/54]
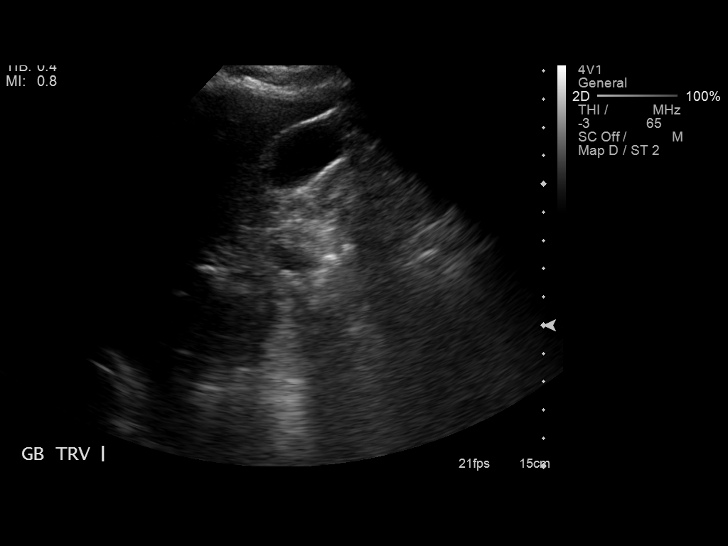
[im 36/54]
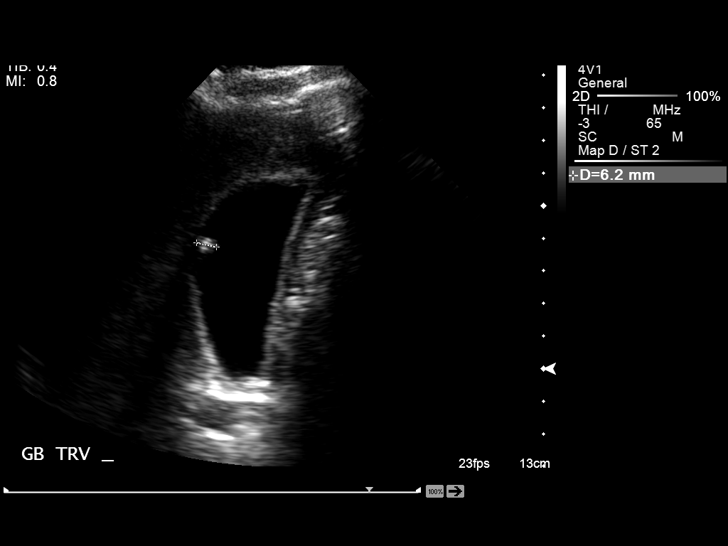
[im 40/54]
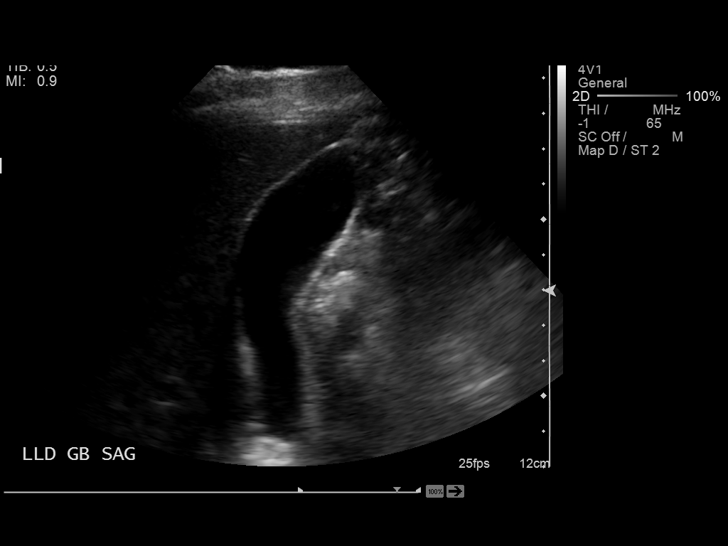
[im 45/54]
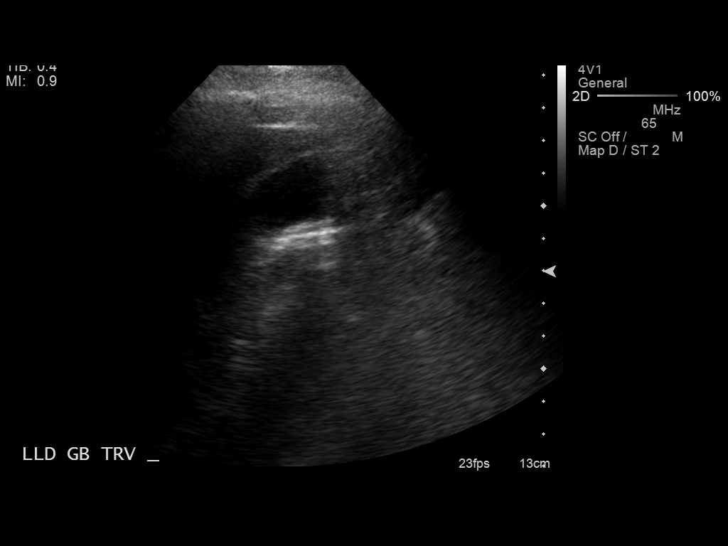
[im 49/54]
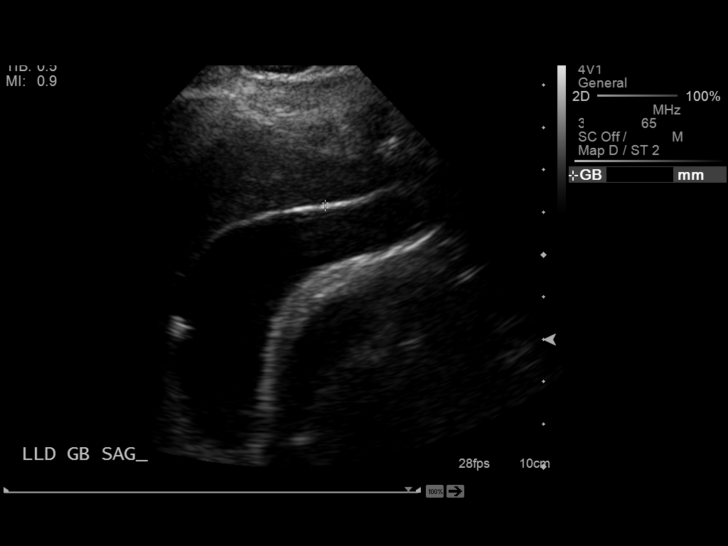
[im 54/54]
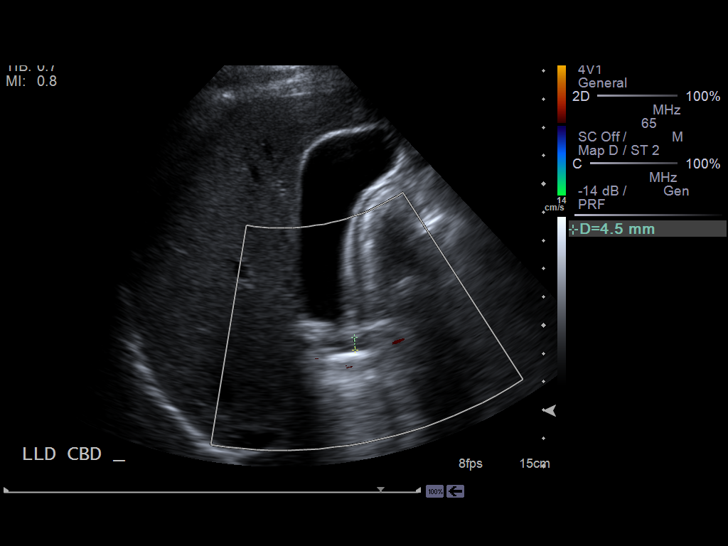

[14 of 25 positions shown; findings below may reference images not displayed]

FINDINGS: Previously noted gallstones are no longer visualized.
There is no wall thickening, pericholecystic fluid, or Murphy's
sign.  The small polyp in the nondependent portion of the
gallbladder has enlarged from a maximal diameter of 4 mm to 6 mm.

The common bile duct is normal in caliber.

The liver is normal in echogenicity and without focal mass.
IMPRESSION: Previously seen gallstones are normal longer visualized.

The small gallbladder polyp has enlarged from 4 mm to 6 mm.  The
interval growth does raise the possibility of malignancy.  At 6 mm,
malignancy potential still remains small. Cholecystectomy should be
considered.

## 2014-07-22 ENCOUNTER — Encounter: Payer: Self-pay | Admitting: Cardiovascular Disease

## 2014-08-15 ENCOUNTER — Other Ambulatory Visit: Payer: Self-pay | Admitting: Cardiovascular Disease

## 2014-09-16 ENCOUNTER — Encounter (HOSPITAL_COMMUNITY): Payer: Self-pay | Admitting: Cardiovascular Disease

## 2014-09-29 ENCOUNTER — Other Ambulatory Visit: Payer: Self-pay | Admitting: Cardiovascular Disease

## 2014-11-18 ENCOUNTER — Other Ambulatory Visit: Payer: Self-pay | Admitting: Cardiovascular Disease

## 2015-01-14 ENCOUNTER — Other Ambulatory Visit: Payer: Self-pay | Admitting: Gastroenterology

## 2015-01-17 ENCOUNTER — Encounter: Payer: Self-pay | Admitting: Internal Medicine

## 2015-01-17 NOTE — Telephone Encounter (Signed)
APPOINTMENT MADE AND LETTER SENT °

## 2015-01-17 NOTE — Telephone Encounter (Signed)
Overdue for OV with RMR.  Refills to cover until OV.

## 2015-01-28 ENCOUNTER — Other Ambulatory Visit: Payer: Self-pay | Admitting: *Deleted

## 2015-01-28 MED ORDER — TICAGRELOR 90 MG PO TABS: 90.0000 mg | ORAL_TABLET | Freq: Two times a day (BID) | ORAL | Status: DC

## 2015-02-13 ENCOUNTER — Other Ambulatory Visit: Payer: Self-pay | Admitting: Cardiovascular Disease

## 2015-02-25 ENCOUNTER — Encounter: Payer: Self-pay | Admitting: Internal Medicine

## 2015-02-25 ENCOUNTER — Encounter (INDEPENDENT_AMBULATORY_CARE_PROVIDER_SITE_OTHER): Payer: Self-pay

## 2015-02-25 ENCOUNTER — Ambulatory Visit (INDEPENDENT_AMBULATORY_CARE_PROVIDER_SITE_OTHER): Payer: BLUE CROSS/BLUE SHIELD | Admitting: Internal Medicine

## 2015-02-25 VITALS — BP 123/82 | HR 89 | Temp 98.1°F | Ht 66.0 in | Wt 195.4 lb

## 2015-02-25 DIAGNOSIS — R0789 Other chest pain: Secondary | ICD-10-CM | POA: Diagnosis not present

## 2015-02-25 DIAGNOSIS — K219 Gastro-esophageal reflux disease without esophagitis: Secondary | ICD-10-CM

## 2015-02-25 NOTE — Progress Notes (Signed)
Primary Care Physician:  Marton Redwood, MD Primary Gastroenterologist:  Dr. Gala Romney  Pre-Procedure History & Physical: HPI:  Eric Fisher is a 49 y.o. male here for followup of GERD with chest pain , coronary artery disease,  history MI on Brilinta chronically. He continues do very well on AcipHex 20 mg as well as Elavil 10 mg daily. Hasn't had any chest pain, whatsoever, in the past 2 years. No dysphagia, nausea, vomiting,  melena or hematochezia. Followed closely by cardiology. Prior EGD in 2009 demonstrated only a small hiatal hernia.  Past Medical History  Diagnosis Date  . GERD (gastroesophageal reflux disease)   . 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 Coronary artery disease     ectatic coronaries per cath in August 2013 - managed with Brilinta and aspirin  . Anxiety   . 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 Gallstones 05/2011    on abd u/s  . Hypertension     he denies  . Complication of anesthesia   . PONV (postoperative nausea and vomiting)   . Hemorrhoid     s/p surgery August 2013    Past Surgical History  Procedure Laterality Date  . Knee arthroscopy    . Hemorroidectomy    . Transthoracic echocardiogram  03/13/2011    Left ventricle: Mild distal septal hypokinesis The cavity size was normal. Systolic function was normal. The estimated ejection fraction was 55%. Wall motion was normal  . Esophagogastroduodenoscopy  02/2008    small hh  . Bravo ph study  02/2008    Day 1, 155 episodes of acid reflux with longest episode 29 minutes, DeMeester 41.7. Day 2, 51 episodes, DeMeester 12.4. Two of three episodes of chest pain correlated with episode of acid reflux. Study done OFF PPI.  Marland Kitchen Esophagogastroduodenoscopy  06/19/2011    Dr. Vivi Ferns esophagus, small hiatal hernia o/w normal stomach  . Cardiac catheterization  03/11/2011    This demonstrated marked ectasia of the proximal LAD with suggestion of a large filling defect consistent   with thrombus.  There was clot extending into the first septal   perforating branch which had slow flow.   . Cardiac catheterization  05/19/2012    Angiographic data- large and slightly ectatic left main, no stenosis or thrombi, pLAD extremely large and ectatic, no thombi, remained LAD normal; normal LCx without stenosis or ectasia; minor luminal RCA irregularities, dRCA mildly ectatic; PDA and PLSA normal size.   Marland Kitchen Hemorrhoid surgery  05/23/2012    Procedure: HEMORRHOIDECTOMY;  Surgeon: Jamesetta So, MD;  Location: [facility];  Service: General;  Laterality: N/A;  . Cholecystectomy N/A 04/17/2013    Procedure: LAPAROSCOPIC CHOLECYSTECTOMY;  Surgeon: Jamesetta So, MD;  Location: [facility];  Service: General;  Laterality: N/A;  . Left heart catheterization with coronary angiogram N/A 05/19/2012    Procedure: LEFT HEART CATHETERIZATION WITH CORONARY ANGIOGRAM;  Surgeon: Thayer Headings, MD;  Location: [facility] CATH LAB;  Service: Cardiovascular;  Laterality: N/A;    Prior to Admission medications   Medication Sig Start Date End Date Taking? Authorizing Provider  acetaminophen (TYLENOL) 500 MG tablet Take 1,000 mg by mouth every 6 (six) hours as needed. For pain   Yes Historical Provider, MD  ALPRAZolam Duanne Moron) 0.5 MG tablet Take 0.5 mg by mouth 2 (two) times daily as needed. For anxiety  05/03/11  Yes Historical Provider, MD  amitriptyline (ELAVIL) 10 MG tablet TAKE 1  TABLET AT BEDTIME 01/17/15  Yes Mahala Menghini, PA-C  atorvastatin (LIPITOR) 40 MG tablet TAKE 1 TABLET AT BEDTIME (REDUCED STRENGTH) 08/16/14  Yes Thayer Headings, MD  fish oil-omega-3 fatty acids 1000 MG capsule Take 2,000 g by mouth daily.    Yes Historical Provider, MD  metoprolol tartrate (LOPRESSOR) 25 MG tablet TAKE 1 TABLET TWICE A DAY 02/14/15  Yes Thayer Headings, MD  Multiple Vitamin (MULTIVITAMIN WITH MINERALS) TABS Take 1 tablet by mouth every morning.   Yes Historical Provider, MD  nitroGLYCERIN (NITROSTAT) 0.4 MG SL tablet Place 1  tablet (0.4 mg total) under the tongue every 5 (five) minutes x 3 doses as needed. Chest pain 03/22/14  Yes Thayer Headings, MD  RABEprazole (ACIPHEX) 20 MG tablet TAKE 1 TABLET DAILY 01/17/15  Yes Mahala Menghini, PA-C  ticagrelor (BRILINTA) 90 MG TABS tablet Take 1 tablet (90 mg total) by mouth every 12 (twelve) hours. 01/28/15  Yes Thayer Headings, MD    Allergies as of 02/25/2015  . (No Known Allergies)    Family History  Problem Relation Age of Onset  . Arrhythmia Father   . Colon cancer Neg Hx     History   Social History  . Marital Status: Single    Spouse Name: N/A  . Number of Children: 0  . 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 Smoking status: Never Smoker   . Smokeless tobacco: Not on file  . Alcohol Use: No  . Drug Use: No  . Sexual Activity: Not Currently   Other Topics Concern  . Not on file   Social History Narrative    Review of Systems: See HPI, otherwise negative ROS  Physical Exam: BP 123/82 mmHg  Pulse 89  Temp(Src) 98.1 F (36.7 C) (Oral)  Ht 5\' 6"  (1.676 m)  Wt 195 lb 6.4 oz (88.633 kg)  BMI 31.55 kg/m2 General:   Alert,  Well-developed, well-nourished, pleasant and cooperative in NAD Skin:  Intact without significant lesions or rashes. Eyes:  Sclera clear, no icterus.   Conjunctiva pink. Ears:  Normal auditory acuity. Nose:  No deformity, discharge,  or lesions. Mouth:  No deformity or lesions. Neck:  Supple; no masses or thyromegaly. No significant cervical adenopathy. Lungs:  Clear throughout to auscultation.   No wheezes, crackles, or rhonchi. No acute distress. Heart:  Regular rate and rhythm; no murmurs, clicks, rubs,  or gallops. Abdomen: Non-distended, normal bowel sounds.  Soft and nontender without appreciable mass or hepatosplenomegaly.  Pulses:  Normal pulses noted. Extremities:  Without clubbing or edema.  Impression: Pleasant 49 year old gentleman with GERD  (previously manifested with atypical chest pain) and coronary artery disease. GERD well controlled. No chest pain. Tolerating rabeprazole and amitriptyline very well.  No active GI issues at this time.  Recommendations:   Continue Aciphex daily  Continue Elavil daily   GERD information provided  Office visit in 2 years     Notice: This dictation was prepared with Dragon dictation along with smaller phrase technology. Any transcriptional errors that result from this process are unintentional and may not be corrected upon review.

## 2015-02-25 NOTE — Patient Instructions (Signed)
Continue Aciphex daily  Continue Elavil daily   GERD information provided  Office visit in 2 years

## 2015-04-04 ENCOUNTER — Encounter: Payer: Self-pay | Admitting: Cardiovascular Disease

## 2015-04-04 ENCOUNTER — Ambulatory Visit (INDEPENDENT_AMBULATORY_CARE_PROVIDER_SITE_OTHER): Payer: BLUE CROSS/BLUE SHIELD | Admitting: Cardiovascular Disease

## 2015-04-04 VITALS — BP 126/68 | HR 71 | Ht 66.0 in | Wt 193.0 lb

## 2015-04-04 DIAGNOSIS — E785 Hyperlipidemia, unspecified: Secondary | ICD-10-CM | POA: Diagnosis not present

## 2015-04-04 DIAGNOSIS — I249 Acute ischemic heart disease, unspecified: Secondary | ICD-10-CM | POA: Diagnosis not present

## 2015-04-04 DIAGNOSIS — I2510 Atherosclerotic heart disease of native coronary artery without angina pectoris: Secondary | ICD-10-CM

## 2015-04-04 LAB — LIPID PANEL
CHOLESTEROL: 101 mg/dL (ref 0–200)
HDL: 39.3 mg/dL (ref >39.00)
LDL CALC: 50 mg/dL (ref 0–99)
NonHDL: 61.7
Total CHOL/HDL Ratio: 3
Triglycerides: 61 mg/dL (ref 0.0–149.0)
VLDL: 12.2 mg/dL (ref 0.0–40.0)

## 2015-04-04 LAB — HEPATIC FUNCTION PANEL
ALT: 26 U/L (ref 0–53)
AST: 21 U/L (ref 0–37)
Albumin: 4.3 g/dL (ref 3.5–5.2)
Alkaline Phosphatase: 61 U/L (ref 39–117)
Bilirubin, Direct: 0.1 mg/dL (ref 0.0–0.3)
TOTAL PROTEIN: 7.4 g/dL (ref 6.0–8.3)
Total Bilirubin: 0.8 mg/dL (ref 0.2–1.2)

## 2015-04-04 LAB — BASIC METABOLIC PANEL
BUN: 11 mg/dL (ref 6–23)
CALCIUM: 9.4 mg/dL (ref 8.4–10.5)
CO2: 29 mEq/L (ref 19–32)
Chloride: 104 mEq/L (ref 96–112)
Creatinine, Ser: 0.89 mg/dL (ref 0.40–1.50)
GFR: 97.46 mL/min (ref >60.00)
Glucose, Bld: 101 mg/dL — ABNORMAL HIGH (ref 70–99)
Potassium: 4.2 mEq/L (ref 3.5–5.1)
Sodium: 137 mEq/L (ref 135–145)

## 2015-04-04 NOTE — Patient Instructions (Signed)

## 2015-04-04 NOTE — Progress Notes (Signed)
Cardiology Office Note   Date:  04/04/2015   ID:  Eric Fisher, DOB Jun 20, 1966, MRN [id number]  PCP:  Marton Redwood, MD  Cardiologist:   Acie Fredrickson Wonda Cheng, MD   Chief Complaint  Patient presents with  . Follow-up    CAD   1. 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 Hyperlipidemia   History of Present Illness: Eric Fisher is a 49 year old gentleman with a history of coronary artery disease. 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 We treated with Brilinta, aspirin, and Coumadin. He's had some atypical episodes of chest pain since last saw him. None of his symptoms were similar to his presenting episodes of angina. He exercises on a daily basis and does not have any episodes of chest pain.  He has been maintained on Brilinta and Coumadin he seems to be doing fairly well. He is quite active and has not had any recurrent episodes of angina.  Dec. 9, 2013 When I last saw him several months ago we discontinued his Coumadin. We have maintained the Brilinta and ASA 81 QD. He has been able to all of his normal activities without any chest pain.  March 16, 2013:  He denies any chest pain. He is getting some regular exercise.  March 18, 2014:  Eric Fisher is doing very well. He's not had any episodes of chest pain or shortness breath. He has remained very active. He's been very busy at work. He's looking forward to getting back into an exercise regimen this summer.   April 04, 2015:   Eric Fisher is a 49 y.o. male who presents for follow up of his CAD Doing well.  No CP .     Past Medical History  Diagnosis Date  . GERD (gastroesophageal reflux disease)   . 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 Coronary artery disease     ectatic coronaries per cath in August 2013 - managed with Brilinta and aspirin  . Anxiety   . 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 Gallstones  05/2011    on abd u/s  . Hypertension     he denies  . Complication of anesthesia   . PONV (postoperative nausea and vomiting)   . Hemorrhoid     s/p surgery August 2013    Past Surgical History  Procedure Laterality Date  . Knee arthroscopy    . Hemorroidectomy    . Transthoracic echocardiogram  03/13/2011    Left ventricle: Mild distal septal hypokinesis The cavity size was normal. Systolic function was normal. The estimated ejection fraction was 55%. Wall motion was normal  . Esophagogastroduodenoscopy  02/2008    small hh  . Bravo ph study  02/2008    Day 1, 155 episodes of acid reflux with longest episode 29 minutes, DeMeester 41.7. Day 2, 51 episodes, DeMeester 12.4. Two of three episodes of chest pain correlated with episode of acid reflux. Study done OFF PPI.  Marland Kitchen Esophagogastroduodenoscopy  06/19/2011    Dr. Vivi Ferns esophagus, small hiatal hernia o/w normal stomach  . Cardiac catheterization  03/11/2011    This demonstrated marked ectasia of the proximal LAD with suggestion of a large filling defect consistent  with thrombus.  There was clot extending into the first septal   perforating branch which had slow flow.   . Cardiac catheterization  05/19/2012    Angiographic data- large and slightly  ectatic left main, no stenosis or thrombi, pLAD extremely large and ectatic, no thombi, remained LAD normal; normal LCx without stenosis or ectasia; minor luminal RCA irregularities, dRCA mildly ectatic; PDA and PLSA normal size.   Marland Kitchen Hemorrhoid surgery  05/23/2012    Procedure: HEMORRHOIDECTOMY;  Surgeon: Jamesetta So, MD;  Location: [facility];  Service: General;  Laterality: N/A;  . Cholecystectomy N/A 04/17/2013    Procedure: LAPAROSCOPIC CHOLECYSTECTOMY;  Surgeon: Jamesetta So, MD;  Location: [facility];  Service: General;  Laterality: N/A;  . Left heart catheterization with coronary angiogram N/A 05/19/2012    Procedure: LEFT HEART CATHETERIZATION WITH CORONARY ANGIOGRAM;  Surgeon: Thayer Headings, MD;  Location: [facility] CATH LAB;  Service: Cardiovascular;  Laterality: N/A;     Current Outpatient Prescriptions  Medication Sig Dispense Refill  . acetaminophen (TYLENOL) 500 MG tablet Take 1,000 mg by mouth every 6 (six) hours as needed. For pain    . ALPRAZolam (XANAX) 0.5 MG tablet Take 0.5 mg by mouth 2 (two) times daily as needed. For anxiety     . amitriptyline (ELAVIL) 10 MG tablet TAKE 1 TABLET AT BEDTIME 90 tablet 0  . atorvastatin (LIPITOR) 40 MG tablet TAKE 1 TABLET AT BEDTIME (REDUCED STRENGTH) 90 tablet 2  . metoprolol tartrate (LOPRESSOR) 25 MG tablet TAKE 1 TABLET TWICE A DAY 180 tablet 1  . Multiple Vitamin (MULTIVITAMIN WITH MINERALS) TABS Take 1 tablet by mouth every morning.    . nitroGLYCERIN (NITROSTAT) 0.4 MG SL tablet Place 1 tablet (0.4 mg total) under the tongue every 5 (five) minutes x 3 doses as needed. Chest pain 25 tablet 3  . RABEprazole (ACIPHEX) 20 MG tablet TAKE 1 TABLET DAILY 90 tablet 0  . ticagrelor (BRILINTA) 90 MG TABS tablet Take 1 tablet (90 mg total) by mouth every 12 (twelve) hours. 180 tablet 1   No current facility-administered medications for this visit.    Allergies:   Review of patient's allergies indicates no known allergies.    Social History:  The patient  reports that he has never smoked. He does not have any smokeless tobacco history on file. He reports that he does not drink alcohol or use illicit drugs.   Family History:  The patient's family history includes Arrhythmia in his father. There is no history of Colon cancer.    ROS:  Please see the history of present illness.    Review of Systems: Constitutional:  denies fever, chills, diaphoresis, appetite change and fatigue.  HEENT: denies photophobia, eye pain, redness, hearing loss, ear pain, congestion, sore throat, rhinorrhea, sneezing, neck pain, neck stiffness and tinnitus.  Respiratory: denies SOB, DOE, cough, chest tightness, and wheezing.  Cardiovascular: denies  chest pain, palpitations and leg swelling.  Gastrointestinal: denies nausea, vomiting, abdominal pain, diarrhea, constipation, blood in stool.  Genitourinary: denies dysuria, urgency, frequency, hematuria, flank pain and difficulty urinating.  Musculoskeletal: denies  myalgias, back pain, joint swelling, arthralgias and gait problem.   Skin: denies pallor, rash and wound.  Neurological: denies dizziness, seizures, syncope, weakness, light-headedness, numbness and headaches.   Hematological: denies adenopathy, easy bruising, personal or family bleeding history.  Psychiatric/ Behavioral: denies suicidal ideation, mood changes, confusion, nervousness, sleep disturbance and agitation.       All other systems are reviewed and negative.    PHYSICAL EXAM: VS:  BP 126/68 mmHg  Pulse 71  Ht 5\' 6"  (1.676 m)  Wt 87.544 kg (193 lb)  BMI 31.17 kg/m2 , BMI Body mass index is  31.17 kg/(m^2). GEN: Well nourished, well developed, in no acute distress HEENT: normal Neck: no JVD, carotid bruits, or masses Cardiac: RRR; no murmurs, rubs, or gallops,no edema  Respiratory:  clear to auscultation bilaterally, normal work of breathing GI: soft, nontender, nondistended, + BS MS: no deformity or atrophy Skin: warm and dry, no rash Neuro:  Strength and sensation are intact Psych: normal   EKG:  EKG is ordered today. The ekg ordered today demonstrates  NSR at 71.  Normal ECG   Recent Labs: No results found for requested labs within last 365 days.    Lipid Panel    Component Value Date/Time   CHOL 90 03/15/2014 0743   CHOL 92 03/15/2014 0743   TRIG 86 03/15/2014 0743   TRIG 69.0 03/15/2014 0743   HDL 37* 03/15/2014 0743   HDL 34.90* 03/15/2014 0743   CHOLHDL 3 03/15/2014 0743   VLDL 13.8 03/15/2014 0743   LDLCALC 36 03/15/2014 0743   LDLCALC 43 03/15/2014 0743      Wt Readings from Last 3 Encounters:  04/04/15 87.544 kg (193 lb)  02/25/15 88.633 kg (195 lb 6.4 oz)  03/18/14 87.091 kg  (192 lb)      Other studies Reviewed: Additional studies/ records that were reviewed today include: . Review of the above records demonstrates:    ASSESSMENT AND PLAN:  1. 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   No angina .     2. Hyperlipidemia  - labs were ok in June 2015.   Will recheck today     Current medicines are reviewed at length with the patient today.  The patient does not have concerns regarding medicines.  The following changes have been made:  no change  Labs/ tests ordered today include:  No orders of the defined types were placed in this encounter.     Disposition:   FU with me in 1 year      Nahser, Wonda Cheng, MD  04/04/2015 8:31 AM    [facility] [place], [place], [place]  [address] Phone: [phone]; Fax: [phone]   [facility]  [address] [place], [place]  [address] [phone]    Fax [phone]

## 2015-04-16 ENCOUNTER — Other Ambulatory Visit: Payer: Self-pay | Admitting: Gastroenterology

## 2015-04-27 ENCOUNTER — Telehealth: Payer: Self-pay | Admitting: Cardiovascular Disease

## 2015-04-27 ENCOUNTER — Other Ambulatory Visit: Payer: Self-pay

## 2015-04-27 MED ORDER — METOPROLOL TARTRATE 25 MG PO TABS: 25.0000 mg | ORAL_TABLET | Freq: Two times a day (BID) | ORAL | Status: DC

## 2015-05-10 ENCOUNTER — Other Ambulatory Visit: Payer: Self-pay | Admitting: Cardiovascular Disease

## 2015-06-17 NOTE — Telephone Encounter (Signed)
ERROR

## 2015-07-27 ENCOUNTER — Other Ambulatory Visit: Payer: Self-pay | Admitting: Cardiovascular Disease

## 2015-07-28 ENCOUNTER — Telehealth: Payer: Self-pay | Admitting: Internal Medicine

## 2015-07-28 NOTE — Telephone Encounter (Signed)
We have not seen the pt since May 2016. We have not seen him for hemorrhoids. Pt needs an office visit. He has one for 08/26/15 with RMR but if they are bothering him really bad he needs to be seen sooner with an extender.

## 2015-07-28 NOTE — Telephone Encounter (Signed)
Called pt and his pcp is calling him in a medication for his hemorrhoids.  He is going to keep appt with RMR for now.

## 2015-07-28 NOTE — Telephone Encounter (Signed)
[phone]   PATIENT HEMORRHOIDS ACTING UP AND OTC MEDS ARE NOT WORKING   PLEASE ADVISE

## 2015-08-26 ENCOUNTER — Ambulatory Visit (INDEPENDENT_AMBULATORY_CARE_PROVIDER_SITE_OTHER): Payer: BLUE CROSS/BLUE SHIELD | Admitting: Internal Medicine

## 2015-08-26 ENCOUNTER — Encounter: Payer: Self-pay | Admitting: Internal Medicine

## 2015-08-26 VITALS — BP 127/79 | HR 89 | Temp 97.0°F | Ht 71.0 in | Wt 201.8 lb

## 2015-08-26 DIAGNOSIS — K219 Gastro-esophageal reflux disease without esophagitis: Secondary | ICD-10-CM

## 2015-08-26 DIAGNOSIS — K645 Perianal venous thrombosis: Secondary | ICD-10-CM | POA: Diagnosis not present

## 2015-08-26 NOTE — Patient Instructions (Signed)
Continue hydrocortisone cream to anorectum  Sitz baths  Hemorrhoid information provided  Continue Aciphex 20 mg daily  Continue softener and fiber supplement daily  Office visit in 6 months

## 2015-08-26 NOTE — Progress Notes (Signed)
Primary Care Physician:  Marton Redwood, MD Primary Gastroenterologist:  Dr. Gala Romney  Pre-Procedure History & Physical: HPI:  Eric Fisher is a 49 y.o. male here for follow-up of GERD the setting of coronary artery disease. On chronic Brilinta therapy. Reflux symptoms well controlled on AcipHex 20 mg daily. He has had hemorrhoid issues with recently been diagnosed with a thrombosed hemorrhoid. Saw Dr. Brigitte Pulse recently. He was sent to a surgeon in [place]. He was evaluated but no I and D performed because of ongoing antiplatelet therapy. Patient states with hydrocortisone cream for his hemorrhoid discomfort, swelling has settled down considerably over the past one week. Colace and fiber supplementation was added to his regimen. He is doing much better at this time. He does have periodic significant flares of hemorrhoids. Status post hemorrhoidectomy in 2013 by Dr. Arnoldo Morale. He has not passed any blood.  Past Medical History  Diagnosis Date  . GERD (gastroesophageal reflux disease)   . MI, acute, non ST segment elevation ([place]) 03/2011    thrombus of LAD  . Coronary artery disease     ectatic coronaries per cath in August 2013 - managed with Brilinta and aspirin  . Anxiety   . 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 Gallstones 05/2011    on abd u/s  . Hypertension     he denies  . Complication of anesthesia   . PONV (postoperative nausea and vomiting)   . Hemorrhoid     s/p surgery August 2013    Past Surgical History  Procedure Laterality Date  . Knee arthroscopy    . Hemorroidectomy    . Transthoracic echocardiogram  03/13/2011    Left ventricle: Mild distal septal hypokinesis The cavity size was normal. Systolic function was normal. The estimated ejection fraction was 55%. Wall motion was normal  . Esophagogastroduodenoscopy  02/2008    small hh  . Bravo ph study  02/2008    Day 1, 155 episodes of acid reflux with longest episode 29 minutes, DeMeester 41.7. Day 2, 51 episodes,  DeMeester 12.4. Two of three episodes of chest pain correlated with episode of acid reflux. Study done OFF PPI.  Marland Kitchen Esophagogastroduodenoscopy  06/19/2011    Dr. Vivi Ferns esophagus, small hiatal hernia o/w normal stomach  . Cardiac catheterization  03/11/2011    This demonstrated marked ectasia of the proximal LAD with suggestion of a large filling defect consistent  with thrombus.  There was clot extending into the first septal   perforating branch which had slow flow.   . Cardiac catheterization  05/19/2012    Angiographic data- large and slightly ectatic left main, no stenosis or thrombi, pLAD extremely large and ectatic, no thombi, remained LAD normal; normal LCx without stenosis or ectasia; minor luminal RCA irregularities, dRCA mildly ectatic; PDA and PLSA normal size.   Marland Kitchen Hemorrhoid surgery  05/23/2012    Procedure: HEMORRHOIDECTOMY;  Surgeon: Jamesetta So, MD;  Location: [facility];  Service: General;  Laterality: N/A;  . Cholecystectomy N/A 04/17/2013    Procedure: LAPAROSCOPIC CHOLECYSTECTOMY;  Surgeon: Jamesetta So, MD;  Location: [facility];  Service: General;  Laterality: N/A;  . Left heart catheterization with coronary angiogram N/A 05/19/2012    Procedure: LEFT HEART CATHETERIZATION WITH CORONARY ANGIOGRAM;  Surgeon: Thayer Headings, MD;  Location: [facility] CATH LAB;  Service: Cardiovascular;  Laterality: N/A;    Prior to Admission medications   Medication Sig Start Date End Date Taking? Authorizing Provider  acetaminophen (TYLENOL) 500  MG tablet Take 1,000 mg by mouth every 6 (six) hours as needed. For pain   Yes Historical Provider, MD  ALPRAZolam Duanne Moron) 0.5 MG tablet Take 0.5 mg by mouth 2 (two) times daily as needed. For anxiety  05/03/11  Yes Historical Provider, MD  amitriptyline (ELAVIL) 10 MG tablet TAKE 1 TABLET AT BEDTIME 04/18/15  Yes Mahala Menghini, PA-C  atorvastatin (LIPITOR) 40 MG tablet TAKE 1 TABLET AT BEDTIME (REDUCED STRENGTH) 05/11/15  Yes Thayer Headings, MD  metoprolol  tartrate (LOPRESSOR) 25 MG tablet Take 1 tablet (25 mg total) by mouth 2 (two) times daily. 04/27/15  Yes Thayer Headings, MD  Multiple Vitamin (MULTIVITAMIN WITH MINERALS) TABS Take 1 tablet by mouth every morning.   Yes Historical Provider, MD  nitroGLYCERIN (NITROSTAT) 0.4 MG SL tablet Place 1 tablet (0.4 mg total) under the tongue every 5 (five) minutes x 3 doses as needed. Chest pain 03/22/14  Yes Thayer Headings, MD  RABEprazole (ACIPHEX) 20 MG tablet TAKE 1 TABLET DAILY 04/18/15  Yes Mahala Menghini, PA-C  ticagrelor (BRILINTA) 90 MG TABS tablet Take 1 tablet (90 mg total) by mouth every 12 (twelve) hours. 07/27/15  Yes Thayer Headings, MD    Allergies as of 08/26/2015  . (No Known Allergies)    Family History  Problem Relation Age of Onset  . Arrhythmia Father   . Colon cancer Neg Hx     Social History   Social History  . Marital Status: Single    Spouse Name: N/A  . Number of Children: 0  . Years of Education: N/A   Occupational History  . maintenance [employer]   Social History Main Topics  . Smoking status: Never Smoker   . Smokeless tobacco: Not on file  . Alcohol Use: No  . Drug Use: No  . Sexual Activity: Not Currently   Other Topics Concern  . Not on file   Social History Narrative    Review of Systems: See HPI, otherwise negative ROS  Physical Exam: BP 127/79 mmHg  Pulse 89  Temp(Src) 97 F (36.1 C) (Oral)  Ht 5\' 11"  (1.803 m)  Wt 201 lb 12.8 oz (91.536 kg)  BMI 28.16 kg/m2 General:   Alert,  Well-developed, well-nourished, pleasant and cooperative in NAD Skin:  Intact without significant lesions or rashes. Eyes:  Sclera clear, no icterus.   Conjunctiva pink. Ears:  Normal auditory acuity. Nose:  No deformity, discharge,  or lesions. Mouth:  No deformity or lesions. Neck:  Supple; no masses or thyromegaly. No significant cervical adenopathy. Lungs:  Clear throughout to auscultation.   No wheezes, crackles, or rhonchi. No acute distress. Heart:   Regular rate and rhythm; no murmurs, clicks, rubs,  or gallops. Abdomen: Non-distended, normal bowel sounds.  Soft and nontender without appreciable mass or hepatosplenomegaly.  Pulses:  Normal pulses noted. Extremities:  Without clubbing or edema. Rectal:  Small soft external probably thrombosed hemorrhoid tag noted. Nontender to palpation. Digital rectal exam without mass nontender. Trace stool present is Hemoccult negative   Impression:   Very pleasant 49 year old gentleman with the recent symptomatic, thrombosed hemorrhoid. It is now is resolving with conservative measures. He is much improved. History of symptomatic hemorrhoidal disease requiring hemorrhoidectomy previously. Chronic antiplatelet therapy with brilinta  Makes treatment options more challenging. He would likely be a good hemorrhoid banding candidate if not for chronic antiplatelet therapy. To consider banding further, he would need to come off of Brilinta for 7-10 days to minimize  risk of bleeding. This would involve Lovenox bridging. This would have to be approached very carefully in consultation with his cardiologist. Moreover, he would likely need more than one session.  He is not bleeding. He is Hemoccult negative. No need for a colonoscopy at this time. For now, I would favor conservative management.  Recommendations:   Continue hydrocortisone cream to the anorectum  Sitz baths  Hemorrhoid information provided  Continue Aciphex 20 mg daily  Continue stool softener and fiber supplement daily  Office visit in 6 months    Notice: This dictation was prepared with Dragon dictation along with smaller phrase technology. Any transcriptional errors that result from this process are unintentional and may not be corrected upon review.

## 2016-01-31 DIAGNOSIS — K219 Gastro-esophageal reflux disease without esophagitis: Secondary | ICD-10-CM | POA: Diagnosis not present

## 2016-01-31 DIAGNOSIS — I10 Essential (primary) hypertension: Secondary | ICD-10-CM | POA: Diagnosis not present

## 2016-01-31 DIAGNOSIS — E782 Mixed hyperlipidemia: Secondary | ICD-10-CM | POA: Diagnosis not present

## 2016-01-31 DIAGNOSIS — I2510 Atherosclerotic heart disease of native coronary artery without angina pectoris: Secondary | ICD-10-CM | POA: Diagnosis not present

## 2016-04-03 ENCOUNTER — Ambulatory Visit (INDEPENDENT_AMBULATORY_CARE_PROVIDER_SITE_OTHER): Payer: BLUE CROSS/BLUE SHIELD | Admitting: Cardiovascular Disease

## 2016-04-03 ENCOUNTER — Encounter: Payer: Self-pay | Admitting: Cardiovascular Disease

## 2016-04-03 VITALS — BP 132/90 | HR 73 | Ht 71.0 in | Wt 199.6 lb

## 2016-04-03 DIAGNOSIS — E785 Hyperlipidemia, unspecified: Secondary | ICD-10-CM | POA: Diagnosis not present

## 2016-04-03 DIAGNOSIS — I2510 Atherosclerotic heart disease of native coronary artery without angina pectoris: Secondary | ICD-10-CM

## 2016-04-03 LAB — COMPREHENSIVE METABOLIC PANEL
ALT: 26 U/L (ref 9–46)
AST: 22 U/L (ref 10–40)
Albumin: 4.3 g/dL (ref 3.6–5.1)
Alkaline Phosphatase: 60 U/L (ref 40–115)
BILIRUBIN TOTAL: 0.8 mg/dL (ref 0.2–1.2)
BUN: 11 mg/dL (ref 7–25)
CALCIUM: 9 mg/dL (ref 8.6–10.3)
CO2: 26 mmol/L (ref 20–31)
CREATININE: 0.84 mg/dL (ref 0.60–1.35)
Chloride: 105 mmol/L (ref 98–110)
GLUCOSE: 99 mg/dL (ref 65–99)
Potassium: 4.4 mmol/L (ref 3.5–5.3)
SODIUM: 139 mmol/L (ref 135–146)
Total Protein: 6.7 g/dL (ref 6.1–8.1)

## 2016-04-03 LAB — LIPID PANEL
CHOL/HDL RATIO: 2.4 ratio (ref <=5.0)
Cholesterol: 105 mg/dL — ABNORMAL LOW (ref 125–200)
HDL: 44 mg/dL (ref >=40)
LDL CALC: 47 mg/dL (ref <130)
Triglycerides: 70 mg/dL (ref <150)
VLDL: 14 mg/dL (ref <30)

## 2016-04-03 MED ORDER — TICAGRELOR 60 MG PO TABS: 60.0000 mg | ORAL_TABLET | Freq: Two times a day (BID) | ORAL | Status: DC

## 2016-04-03 NOTE — Patient Instructions (Signed)
Medication Instructions:  DECREASE Brilinta to 60 mg twice daily   Labwork: TODAY - cholesterol, complete metabolic panel   Testing/Procedures: None Ordered   Follow-Up: Your physician wants you to follow-up in: 1 year with Dr. Acie Fredrickson.  You will receive a reminder letter in the mail two months in advance. If you don't receive a letter, please call our office to schedule the follow-up appointment.   If you need a refill on your cardiac medications before your next appointment, please call your pharmacy.   Thank you for choosing [facility]! Christen Bame, RN [phone]

## 2016-04-03 NOTE — Progress Notes (Signed)
Cardiology Office Note   Date:  04/03/2016   ID:  Eric Fisher, DOB 1966/01/17, MRN [id number]  PCP:  Marton Redwood, MD  Cardiologist:   Mertie Moores, MD   Chief Complaint  Patient presents with  . Coronary Artery Disease   1. 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 Hyperlipidemia   History of Present Illness: Eric Fisher is a 50 year old gentleman with a history of coronary artery disease. 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 We treated with Brilinta, aspirin, and Coumadin. He's had some atypical episodes of chest pain since last saw him. None of his symptoms were similar to his presenting episodes of angina. He exercises on a daily basis and does not have any episodes of chest pain.  He has been maintained on Brilinta and Coumadin he seems to be doing fairly well. He is quite active and has not had any recurrent episodes of angina.  Dec. 9, 2013 When I last saw him several months ago we discontinued his Coumadin. We have maintained the Brilinta and ASA 81 QD. He has been able to all of his normal activities without any chest pain.  March 16, 2013:  He denies any chest pain. He is getting some regular exercise.  March 18, 2014:  Eric Fisher is doing very well. He's not had any episodes of chest pain or shortness breath. He has remained very active. He's been very busy at work. He's looking forward to getting back into an exercise regimen this summer.   April 04, 2015:   Eric Fisher is a 50 y.o. male who presents for follow up of his CAD Doing well.  No CP .    April 03, 2016:  Eric Fisher is doing well.   Has had dx of CAD for 5 years.   Kawasaki's disease  Exercising some .   Putting in long hours at work .    Past Medical History  Diagnosis Date  . GERD (gastroesophageal reflux disease)   . MI, acute, non ST segment elevation ([place]) 03/2011    thrombus of LAD  . Coronary artery  disease     ectatic coronaries per cath in August 2013 - managed with Brilinta and aspirin  . Anxiety   . 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 Gallstones 05/2011    on abd u/s  . Hypertension     he denies  . Complication of anesthesia   . PONV (postoperative nausea and vomiting)   . Hemorrhoid     s/p surgery August 2013    Past Surgical History  Procedure Laterality Date  . Knee arthroscopy    . Hemorroidectomy    . Transthoracic echocardiogram  03/13/2011    Left ventricle: Mild distal septal hypokinesis The cavity size was normal. Systolic function was normal. The estimated ejection fraction was 55%. Wall motion was normal  . Esophagogastroduodenoscopy  02/2008    small hh  . [place] ph study  02/2008    Day 1, 155 episodes of acid reflux with longest episode 29 minutes, DeMeester 41.7. Day 2, 51 episodes, [place]ter 12.4. Two of three episodes of chest pain correlated with episode of acid reflux. Study done OFF PPI.  Marland Kitchen Esophagogastroduodenoscopy  06/19/2011    Dr. Vivi Ferns esophagus, small hiatal hernia o/w normal stomach  . Cardiac catheterization  03/11/2011    This demonstrated marked ectasia of the proximal LAD with suggestion of a large filling defect consistent  with thrombus.  There was clot extending into the first septal   perforating branch which had slow flow.   . Cardiac catheterization  05/19/2012    Angiographic data- large and slightly ectatic left main, no stenosis or thrombi, pLAD extremely large and ectatic, no thombi, remained LAD normal; normal LCx without stenosis or ectasia; minor luminal RCA irregularities, dRCA mildly ectatic; PDA and PLSA normal size.   Marland Kitchen Hemorrhoid surgery  05/23/2012    Procedure: HEMORRHOIDECTOMY;  Surgeon: Jamesetta So, MD;  Location: [facility];  Service: General;  Laterality: N/A;  . Cholecystectomy N/A 04/17/2013    Procedure: LAPAROSCOPIC CHOLECYSTECTOMY;  Surgeon: Jamesetta So, MD;  Location: [facility];  Service: General;  Laterality:  N/A;  . Left heart catheterization with coronary angiogram N/A 05/19/2012    Procedure: LEFT HEART CATHETERIZATION WITH CORONARY ANGIOGRAM;  Surgeon: Thayer Headings, MD;  Location: [facility] CATH LAB;  Service: Cardiovascular;  Laterality: N/A;     Current Outpatient Prescriptions  Medication Sig Dispense Refill  . acetaminophen (TYLENOL) 500 MG tablet Take 1,000 mg by mouth every 6 (six) hours as needed. For pain    . ALPRAZolam (XANAX) 0.5 MG tablet Take 0.5 mg by mouth 2 (two) times daily as needed. For anxiety     . amitriptyline (ELAVIL) 10 MG tablet Take 10 mg by mouth at bedtime.    Marland Kitchen atorvastatin (LIPITOR) 40 MG tablet Take 40 mg by mouth daily.    . metoprolol tartrate (LOPRESSOR) 25 MG tablet Take 1 tablet (25 mg total) by mouth 2 (two) times daily. 180 tablet 3  . Multiple Vitamin (MULTIVITAMIN WITH MINERALS) TABS Take 1 tablet by mouth every morning.    . nitroGLYCERIN (NITROSTAT) 0.4 MG SL tablet Place 1 tablet (0.4 mg total) under the tongue every 5 (five) minutes x 3 doses as needed. Chest pain 25 tablet 3  . RABEprazole (ACIPHEX) 20 MG tablet Take 20 mg by mouth daily.    . ticagrelor (BRILINTA) 90 MG TABS tablet Take 1 tablet (90 mg total) by mouth every 12 (twelve) hours. 180 tablet 3   No current facility-administered medications for this visit.    Allergies:   Review of patient's allergies indicates no known allergies.    Social History:  The patient  reports that he has never smoked. He does not have any smokeless tobacco history on file. He reports that he does not drink alcohol or use illicit drugs.   Family History:  The patient's family history includes Arrhythmia in his father. There is no history of Colon cancer.    ROS:  Please see the history of present illness.    Review of Systems: Constitutional:  denies fever, chills, diaphoresis, appetite change and fatigue.  HEENT: denies photophobia, eye pain, redness, hearing loss, ear pain, congestion, sore throat,  rhinorrhea, sneezing, neck pain, neck stiffness and tinnitus.  Respiratory: denies SOB, DOE, cough, chest tightness, and wheezing.  Cardiovascular: denies chest pain, palpitations and leg swelling.  Gastrointestinal: denies nausea, vomiting, abdominal pain, diarrhea, constipation, blood in stool.  Genitourinary: denies dysuria, urgency, frequency, hematuria, flank pain and difficulty urinating.  Musculoskeletal: denies  myalgias, back pain, joint swelling, arthralgias and gait problem.   Skin: denies pallor, rash and wound.  Neurological: denies dizziness, seizures, syncope, weakness, light-headedness, numbness and headaches.   Hematological: denies adenopathy, easy bruising, personal or family bleeding history.  Psychiatric/ Behavioral: denies suicidal ideation, mood changes, confusion, nervousness, sleep disturbance and agitation.       All other  systems are reviewed and negative.    PHYSICAL EXAM: VS:  BP 132/90 mmHg  Pulse 73  Ht 5\' 11"  (1.803 m)  Wt 199 lb 9.6 oz (90.538 kg)  BMI 27.85 kg/m2  SpO2 97% , BMI Body mass index is 27.85 kg/(m^2). GEN: Well nourished, well developed, in no acute distress HEENT: normal Neck: no JVD, carotid bruits, or masses Cardiac: RRR; no murmurs, rubs, or gallops,no edema  Respiratory:  clear to auscultation bilaterally, normal work of breathing GI: soft, nontender, nondistended, + BS MS: no deformity or atrophy Skin: warm and dry, no rash Neuro:  Strength and sensation are intact Psych: normal   EKG:  EKG is ordered today. The ekg ordered today demonstrates  NSR at 73.  Normal ECG   Recent Labs: 04/04/2015: ALT 26; BUN 11; Creatinine, Ser 0.89; Potassium 4.2; Sodium 137    Lipid Panel    Component Value Date/Time   CHOL 101 04/04/2015 0854   CHOL 90 03/15/2014 0743   TRIG 61.0 04/04/2015 0854   TRIG 86 03/15/2014 0743   HDL 39.30 04/04/2015 0854   HDL 37* 03/15/2014 0743   CHOLHDL 3 04/04/2015 0854   VLDL 12.2 04/04/2015 0854     LDLCALC 50 04/04/2015 0854   LDLCALC 36 03/15/2014 0743      Wt Readings from Last 3 Encounters:  04/03/16 199 lb 9.6 oz (90.538 kg)  08/26/15 201 lb 12.8 oz (91.536 kg)  04/04/15 193 lb (87.544 kg)      Other studies Reviewed: Additional studies/ records that were reviewed today include: . Review of the above records demonstrates:    ASSESSMENT AND PLAN:  1. 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   No angina .   Will reduce Brilinta to 60 mg BID.  Continue ASA 81 mg a day .   2. Hyperlipidemia  -    Will recheck today  Continue Atorvastatin 40 mg a day   Current medicines are reviewed at length with the patient today.  The patient does not have concerns regarding medicines.  The following changes have been made:  no change  Labs/ tests ordered today include:  No orders of the defined types were placed in this encounter.     Disposition:   FU with me in 1 year      Mertie Moores, MD  04/03/2016 7:52 AM    [facility] [place], [place], [place]  [address] Phone: [phone]; Fax: [phone]   [facility]  [address] [place], [place]  [address] [phone]    Fax [phone]

## 2016-04-20 ENCOUNTER — Other Ambulatory Visit: Payer: Self-pay | Admitting: *Deleted

## 2016-04-20 MED ORDER — METOPROLOL TARTRATE 25 MG PO TABS: 25.0000 mg | ORAL_TABLET | Freq: Two times a day (BID) | ORAL | Status: DC

## 2016-04-24 ENCOUNTER — Other Ambulatory Visit: Payer: Self-pay

## 2016-04-24 MED ORDER — RABEPRAZOLE SODIUM 20 MG PO TBEC: 20.0000 mg | DELAYED_RELEASE_TABLET | Freq: Every day | ORAL | Status: DC

## 2016-04-25 ENCOUNTER — Other Ambulatory Visit: Payer: Self-pay

## 2016-04-25 MED ORDER — AMITRIPTYLINE HCL 10 MG PO TABS: 10.0000 mg | ORAL_TABLET | Freq: Every day | ORAL | Status: DC

## 2016-05-17 ENCOUNTER — Other Ambulatory Visit: Payer: Self-pay | Admitting: *Deleted

## 2016-05-17 MED ORDER — ATORVASTATIN CALCIUM 40 MG PO TABS: 40.0000 mg | ORAL_TABLET | Freq: Every day | ORAL | 2 refills | Status: DC

## 2016-07-13 ENCOUNTER — Other Ambulatory Visit: Payer: Self-pay | Admitting: Nurse Practitioner

## 2016-07-18 DIAGNOSIS — Z23 Encounter for immunization: Secondary | ICD-10-CM | POA: Diagnosis not present

## 2016-08-01 DIAGNOSIS — Z125 Encounter for screening for malignant neoplasm of prostate: Secondary | ICD-10-CM | POA: Diagnosis not present

## 2016-08-01 DIAGNOSIS — R7301 Impaired fasting glucose: Secondary | ICD-10-CM | POA: Diagnosis not present

## 2016-08-01 DIAGNOSIS — I10 Essential (primary) hypertension: Secondary | ICD-10-CM | POA: Diagnosis not present

## 2016-08-08 DIAGNOSIS — E784 Other hyperlipidemia: Secondary | ICD-10-CM | POA: Diagnosis not present

## 2016-08-08 DIAGNOSIS — R7301 Impaired fasting glucose: Secondary | ICD-10-CM | POA: Diagnosis not present

## 2016-08-08 DIAGNOSIS — Z1389 Encounter for screening for other disorder: Secondary | ICD-10-CM | POA: Diagnosis not present

## 2016-08-08 DIAGNOSIS — Z0000 Encounter for general adult medical examination without abnormal findings: Secondary | ICD-10-CM | POA: Diagnosis not present

## 2016-08-08 DIAGNOSIS — I2510 Atherosclerotic heart disease of native coronary artery without angina pectoris: Secondary | ICD-10-CM | POA: Diagnosis not present

## 2016-08-08 DIAGNOSIS — I10 Essential (primary) hypertension: Secondary | ICD-10-CM | POA: Diagnosis not present

## 2016-11-25 ENCOUNTER — Other Ambulatory Visit: Payer: Self-pay | Admitting: Cardiovascular Disease

## 2017-01-10 ENCOUNTER — Encounter: Payer: Self-pay | Admitting: Internal Medicine

## 2017-01-29 DIAGNOSIS — E782 Mixed hyperlipidemia: Secondary | ICD-10-CM | POA: Diagnosis not present

## 2017-01-29 DIAGNOSIS — Z008 Encounter for other general examination: Secondary | ICD-10-CM | POA: Diagnosis not present

## 2017-01-29 DIAGNOSIS — Z719 Counseling, unspecified: Secondary | ICD-10-CM | POA: Diagnosis not present

## 2017-01-29 DIAGNOSIS — Z6829 Body mass index (BMI) 29.0-29.9, adult: Secondary | ICD-10-CM | POA: Diagnosis not present

## 2017-01-29 DIAGNOSIS — I2510 Atherosclerotic heart disease of native coronary artery without angina pectoris: Secondary | ICD-10-CM | POA: Diagnosis not present

## 2017-01-29 DIAGNOSIS — I10 Essential (primary) hypertension: Secondary | ICD-10-CM | POA: Diagnosis not present

## 2017-02-07 ENCOUNTER — Other Ambulatory Visit: Payer: Self-pay

## 2017-02-08 MED ORDER — AMITRIPTYLINE HCL 10 MG PO TABS: 10.0000 mg | ORAL_TABLET | Freq: Every day | ORAL | 0 refills | Status: DC

## 2017-02-19 ENCOUNTER — Encounter: Payer: Self-pay | Admitting: Internal Medicine

## 2017-02-19 ENCOUNTER — Ambulatory Visit (INDEPENDENT_AMBULATORY_CARE_PROVIDER_SITE_OTHER): Payer: BLUE CROSS/BLUE SHIELD | Admitting: Internal Medicine

## 2017-02-19 VITALS — BP 128/80 | HR 86 | Temp 96.7°F | Ht 71.0 in | Wt 205.0 lb

## 2017-02-19 DIAGNOSIS — K219 Gastro-esophageal reflux disease without esophagitis: Secondary | ICD-10-CM | POA: Diagnosis not present

## 2017-02-19 NOTE — Patient Instructions (Signed)
GERD information provided  Continue Rabeprazole 20 mg daily  Office visit in 2 years for re-check and to set up first screening colonoscopy

## 2017-02-19 NOTE — Progress Notes (Signed)
Primary Care Physician:  Marton Redwood, MD Primary Gastroenterologist:  Dr. Docia Chuck  Pre-Procedure History & Physical: HPI:  Eric Fisher is a 51 y.o. male here for follow-up of GERD. History of atypical chest pain and coronary artery disease. He has done well over the past several years on rabeprazole 20 mg daily; no dysphagia. Really hasn't had any chest pain. No history of Barrett's on prior EGDs. 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 Patient denies abdominal pain melena or rectal bleeding. Has been hospitalized or had any significant intercurrent illnesses since I saw him in 2016. Patient did have issues with a thrombosed hemorrhoid back in 2016. No further problems since that time.  Past Medical History:  Diagnosis Date  . Anxiety   . Complication of anesthesia   . Coronary artery disease    ectatic coronaries per cath in August 2013 - managed with Brilinta and aspirin  . Gallbladder polyp 05/2011   on abd u/s  . Gallstones 05/2011   on abd u/s  . GERD (gastroesophageal reflux disease)   . Hemorrhoid    s/p surgery August 2013  . Hypertension    he denies  . MI, acute, non ST segment elevation ([place]) 03/2011   thrombus of LAD  . PONV (postoperative nausea and vomiting)     Past Surgical History:  Procedure Laterality Date  . BRAVO [place] STUDY  02/2008   Day 1, 155 episodes of acid reflux with longest episode 29 minutes, De[place]ter 41.7. Day 2, 51 episodes, [place] 12.4. Two of three episodes of chest pain correlated with episode of acid reflux. Study done OFF PPI.  Marland Kitchen CARDIAC CATHETERIZATION  03/11/2011   This demonstrated marked ectasia of the proximal LAD with suggestion of a large filling defect consistent  with thrombus.  There was clot extending into the first septal   perforating branch which had slow flow.   Marland Kitchen CARDIAC CATHETERIZATION  05/19/2012   Angiographic data- large and slightly ectatic left main, no stenosis or thrombi, pLAD extremely large  and ectatic, no thombi, remained LAD normal; normal LCx without stenosis or ectasia; minor luminal RCA irregularities, dRCA mildly ectatic; PDA and PLSA normal size.   . CHOLECYSTECTOMY N/A 04/17/2013   Procedure: LAPAROSCOPIC CHOLECYSTECTOMY;  Surgeon: Jamesetta So, MD;  Location: [facility];  Service: General;  Laterality: N/A;  . ESOPHAGOGASTRODUODENOSCOPY  02/2008   small hh  . ESOPHAGOGASTRODUODENOSCOPY  06/19/2011   Dr. Vivi Ferns esophagus, small hiatal hernia o/w normal stomach  . HEMORRHOID SURGERY  05/23/2012   Procedure: HEMORRHOIDECTOMY;  Surgeon: Jamesetta So, MD;  Location: [facility];  Service: General;  Laterality: N/A;  . HEMORROIDECTOMY    . KNEE ARTHROSCOPY    . LEFT HEART CATHETERIZATION WITH CORONARY ANGIOGRAM N/A 05/19/2012   Procedure: LEFT HEART CATHETERIZATION WITH CORONARY ANGIOGRAM;  Surgeon: Thayer Headings, MD;  Location: [facility] CATH LAB;  Service: Cardiovascular;  Laterality: N/A;  . TRANSTHORACIC ECHOCARDIOGRAM  03/13/2011   Left ventricle: Mild distal septal hypokinesis The cavity size was normal. Systolic function was normal. The estimated ejection fraction was 55%. Wall motion was normal    Prior to Admission medications   Medication Sig Start Date End Date Taking? Authorizing Provider  acetaminophen (TYLENOL) 500 MG tablet Take 1,000 mg by mouth every 6 (six) hours as needed. For pain   Yes [provider]  ALPRAZolam (XANAX) 0.5 MG tablet Take 0.5 mg by mouth 2 (two) times daily as needed. For anxiety  05/03/11  Yes [provider]  amitriptyline (ELAVIL) 10 MG tablet Take 1 tablet (10 mg total) by mouth at bedtime. 02/08/17  Yes Mahala Menghini, PA-C  aspirin 81 MG tablet Take 81 mg by mouth daily.   Yes [provider]  atorvastatin (LIPITOR) 40 MG tablet TAKE 1 TABLET BY MOUTH  DAILY 11/26/16  Yes Nahser, Wonda Cheng, MD  metoprolol tartrate (LOPRESSOR) 25 MG tablet Take 1 tablet (25 mg total) by mouth 2 (two) times daily. 04/20/16  Yes Nahser,  Wonda Cheng, MD  Multiple Vitamin (MULTIVITAMIN WITH MINERALS) TABS Take 1 tablet by mouth every morning.   Yes [provider]  nitroGLYCERIN (NITROSTAT) 0.4 MG SL tablet Place 1 tablet (0.4 mg total) under the tongue every 5 (five) minutes x 3 doses as needed. Chest pain 03/22/14  Yes Nahser, Wonda Cheng, MD  RABEprazole (ACIPHEX) 20 MG tablet TAKE 1 TABLET BY MOUTH  DAILY 07/16/16  Yes Mahala Menghini, PA-C  ticagrelor (BRILINTA) 60 MG TABS tablet Take 1 tablet (60 mg total) by mouth every 12 (twelve) hours. 04/03/16  Yes Nahser, Wonda Cheng, MD    Allergies as of 02/19/2017  . (No Known Allergies)    Family History  Problem Relation Age of Onset  . Arrhythmia Father   . Colon cancer Neg Hx     Social History   Social History  . Marital status: Single    Spouse name: N/A  . Number of children: 0  . Years of education: N/A   Occupational History  . maintenance [employer]   Social History Main Topics  . Smoking status: Never Smoker  . Smokeless tobacco: Never Used  . Alcohol use No  . Drug use: No  . Sexual activity: Not Currently   Other Topics Concern  . Not on file   Social History Narrative  . No narrative on file    Review of Systems: See HPI, otherwise negative ROS  Physical Exam: BP 128/80   Pulse 86   Temp (!) 96.7 F (35.9 C) (Oral)   Ht 5\' 11"  (1.803 m)   Wt 205 lb (93 kg)   BMI 28.59 kg/m  General:   Alert,  Well-developed, well-nourished, pleasant and cooperative in NAD Lungs:  Clear throughout to auscultation.   No wheezes, crackles, or rhonchi. No acute distress. Heart:  Regular rate and rhythm; no murmurs, clicks, rubs,  or gallops. Abdomen: Non-distended, normal bowel sounds.  Soft and nontender without appreciable mass or hepatosplenomegaly.  Pulses:  Normal pulses noted. Extremities:  Without clubbing or edema.  Impression:  Pleasant 51 year old gentleman with a history of coronary disease on lifelong Brilinta and GERD. His GERD symptoms  have been controlled nicely rabeprazole 20 mg daily. All things considered, the benefits of daily PPI therapy outweigh any risks as discussed with the patient today.  He will be 52 in 2 years-first-ever average risk screening colonoscopy to be scheduled at that time.  Recommendations:  GERD information provided  Continue Rabeprazole 20 mg daily  Office visit in 2 years for re-check and to set up first screening colonoscopy       Notice: This dictation was prepared with Dragon dictation along with smaller phrase technology. Any transcriptional errors that result from this process are unintentional and may not be corrected upon review.

## 2017-03-22 ENCOUNTER — Other Ambulatory Visit: Payer: Self-pay | Admitting: Cardiovascular Disease

## 2017-04-05 ENCOUNTER — Other Ambulatory Visit: Payer: Self-pay | Admitting: Cardiovascular Disease

## 2017-05-10 ENCOUNTER — Other Ambulatory Visit: Payer: Self-pay | Admitting: Cardiovascular Disease

## 2017-05-13 ENCOUNTER — Encounter: Payer: Self-pay | Admitting: *Deleted

## 2017-05-14 ENCOUNTER — Other Ambulatory Visit: Payer: Self-pay | Admitting: Gastroenterology

## 2017-05-30 ENCOUNTER — Ambulatory Visit (INDEPENDENT_AMBULATORY_CARE_PROVIDER_SITE_OTHER): Payer: BLUE CROSS/BLUE SHIELD | Admitting: Cardiovascular Disease

## 2017-05-30 ENCOUNTER — Encounter: Payer: Self-pay | Admitting: Cardiovascular Disease

## 2017-05-30 ENCOUNTER — Encounter (INDEPENDENT_AMBULATORY_CARE_PROVIDER_SITE_OTHER): Payer: Self-pay

## 2017-05-30 VITALS — BP 108/82 | HR 76 | Ht 71.0 in | Wt 200.4 lb

## 2017-05-30 DIAGNOSIS — M303 Mucocutaneous lymph node syndrome [Kawasaki]: Secondary | ICD-10-CM

## 2017-05-30 DIAGNOSIS — I2510 Atherosclerotic heart disease of native coronary artery without angina pectoris: Secondary | ICD-10-CM

## 2017-05-30 DIAGNOSIS — E782 Mixed hyperlipidemia: Secondary | ICD-10-CM

## 2017-05-30 LAB — BASIC METABOLIC PANEL
BUN/Creatinine Ratio: 10 (ref 9–20)
BUN: 8 mg/dL (ref 6–24)
CALCIUM: 9.6 mg/dL (ref 8.7–10.2)
CO2: 22 mmol/L (ref 20–29)
CREATININE: 0.83 mg/dL (ref 0.76–1.27)
Chloride: 102 mmol/L (ref 96–106)
GFR calc Af Amer: 120 mL/min/{1.73_m2} (ref >59)
GFR, EST NON AFRICAN AMERICAN: 104 mL/min/{1.73_m2} (ref >59)
Glucose: 99 mg/dL (ref 65–99)
POTASSIUM: 4.6 mmol/L (ref 3.5–5.2)
Sodium: 141 mmol/L (ref 134–144)

## 2017-05-30 LAB — LIPID PANEL
CHOLESTEROL TOTAL: 104 mg/dL (ref 100–199)
Chol/HDL Ratio: 2.6 ratio (ref 0.0–5.0)
HDL: 40 mg/dL (ref >39)
LDL Calculated: 51 mg/dL (ref 0–99)
TRIGLYCERIDES: 67 mg/dL (ref 0–149)
VLDL CHOLESTEROL CAL: 13 mg/dL (ref 5–40)

## 2017-05-30 LAB — HEPATIC FUNCTION PANEL
ALBUMIN: 4.6 g/dL (ref 3.5–5.5)
ALT: 23 IU/L (ref 0–44)
AST: 20 IU/L (ref 0–40)
Alkaline Phosphatase: 88 IU/L (ref 39–117)
BILIRUBIN TOTAL: 0.5 mg/dL (ref 0.0–1.2)
BILIRUBIN, DIRECT: 0.16 mg/dL (ref 0.00–0.40)
Total Protein: 7.5 g/dL (ref 6.0–8.5)

## 2017-05-30 NOTE — Progress Notes (Signed)
Cardiology Office Note   Date:  05/30/2017   ID:  Eric Setting., DOB 11-30-1965, MRN [id number]  PCP:  Marton Redwood, MD  Cardiologist:   Mertie Moores, MD   Chief Complaint  Patient presents with  . Follow-up    CAD   1. 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 Hyperlipidemia   History of Present Illness: Eric Fisher is a 51 year old gentleman with a history of coronary artery disease. 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 We treated with Brilinta, aspirin, and Coumadin. He's had some atypical episodes of chest pain since last saw him. None of his symptoms were similar to his presenting episodes of angina. He exercises on a daily basis and does not have any episodes of chest pain.  He has been maintained on Brilinta and Coumadin he seems to be doing fairly well. He is quite active and has not had any recurrent episodes of angina.  Dec. 9, 2013 When I last saw him several months ago we discontinued his Coumadin. We have maintained the Brilinta and ASA 81 QD. He has been able to all of his normal activities without any chest pain.  March 16, 2013:  He denies any chest pain. He is getting some regular exercise.  March 18, 2014:  Eric Fisher is doing very well. He's not had any episodes of chest pain or shortness breath. He has remained very active. He's been very busy at work. He's looking forward to getting back into an exercise regimen this summer.   April 04, 2015:   Eric Fisher, Presti. is a 51 y.o. male who presents for follow up of his CAD Doing well.  No CP .    April 03, 2016:  Eric Fisher is doing well.   Has had dx of CAD for 5 years.   Kawasaki's disease  Exercising some .   Putting in long hours at work .    Aug. 23, 2018  Eric Fisher is doing well. He has a history of coronary artery disease-Kawasaki's disease. He has been maintained on low-dose Brilinta and is doing quite  well. 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    Past Medical History:  Diagnosis Date  . Anxiety   . Complication of anesthesia   . Coronary artery disease    ectatic coronaries per cath in August 2013 - managed with Brilinta and aspirin  . Gallbladder polyp 05/2011   on abd u/s  . Gallstones 05/2011   on abd u/s  . GERD (gastroesophageal reflux disease)   . Hemorrhoid    s/p surgery August 2013  . Hypertension    he denies  . MI, acute, non ST segment elevation ([place]) 03/2011   thrombus of LAD  . PONV (postoperative nausea and vomiting)     Past Surgical History:  Procedure Laterality Date  . BRAVO [place] STUDY  02/2008   Day 1, 155 episodes of acid reflux with longest episode 29 minutes, DeMeester 41.7. Day 2, 51 episodes, [place]ter 12.4. Two of three episodes of chest pain correlated with episode of acid reflux. Study done OFF PPI.  Marland Kitchen CARDIAC CATHETERIZATION  03/11/2011   This demonstrated marked ectasia of the proximal LAD with suggestion of a large filling defect consistent  with thrombus.  There was clot extending into the first septal   perforating branch which had slow flow.   Marland Kitchen CARDIAC CATHETERIZATION  05/19/2012  Angiographic data- large and slightly ectatic left main, no stenosis or thrombi, pLAD extremely large and ectatic, no thombi, remained LAD normal; normal LCx without stenosis or ectasia; minor luminal RCA irregularities, dRCA mildly ectatic; PDA and PLSA normal size.   . CHOLECYSTECTOMY N/A 04/17/2013   Procedure: LAPAROSCOPIC CHOLECYSTECTOMY;  Surgeon: Jamesetta So, MD;  Location: [facility];  Service: General;  Laterality: N/A;  . ESOPHAGOGASTRODUODENOSCOPY  02/2008   small hh  . ESOPHAGOGASTRODUODENOSCOPY  06/19/2011   Dr. Vivi Ferns esophagus, small hiatal hernia o/w normal stomach  . HEMORRHOID SURGERY  05/23/2012   Procedure: HEMORRHOIDECTOMY;  Surgeon: Jamesetta So, MD;  Location: [facility];  Service: General;  Laterality:  N/A;  . HEMORROIDECTOMY    . KNEE ARTHROSCOPY    . LEFT HEART CATHETERIZATION WITH CORONARY ANGIOGRAM N/A 05/19/2012   Procedure: LEFT HEART CATHETERIZATION WITH CORONARY ANGIOGRAM;  Surgeon: Thayer Headings, MD;  Location: [facility] CATH LAB;  Service: Cardiovascular;  Laterality: N/A;  . TRANSTHORACIC ECHOCARDIOGRAM  03/13/2011   Left ventricle: Mild distal septal hypokinesis The cavity size was normal. Systolic function was normal. The estimated ejection fraction was 55%. Wall motion was normal     Current Outpatient Prescriptions  Medication Sig Dispense Refill  . acetaminophen (TYLENOL) 500 MG tablet Take 1,000 mg by mouth every 6 (six) hours as needed. For pain    . ALPRAZolam (XANAX) 0.5 MG tablet Take 0.5 mg by mouth 2 (two) times daily as needed. For anxiety     . amitriptyline (ELAVIL) 10 MG tablet TAKE 1 TABLET BY MOUTH AT  BEDTIME 90 tablet 3  . aspirin 81 MG tablet Take 81 mg by mouth daily.    Marland Kitchen atorvastatin (LIPITOR) 40 MG tablet TAKE 1 TABLET BY MOUTH  DAILY 90 tablet 0  . BRILINTA 60 MG TABS tablet TAKE 1 TABLET BY MOUTH  EVERY 12 HOURS 180 tablet 0  . metoprolol tartrate (LOPRESSOR) 25 MG tablet TAKE 1 TABLET BY MOUTH TWO  TIMES DAILY 180 tablet 0  . Multiple Vitamin (MULTIVITAMIN WITH MINERALS) TABS Take 1 tablet by mouth every morning.    . nitroGLYCERIN (NITROSTAT) 0.4 MG SL tablet Place 1 tablet (0.4 mg total) under the tongue every 5 (five) minutes x 3 doses as needed. Chest pain 25 tablet 3  . RABEprazole (ACIPHEX) 20 MG tablet TAKE 1 TABLET BY MOUTH  DAILY 90 tablet 3   No current facility-administered medications for this visit.     Allergies:   Patient has no known allergies.    Social History:  The patient  reports that he has never smoked. He has never used smokeless tobacco. He reports that he does not drink alcohol or use drugs.   Family History:  The patient's family history includes Arrhythmia in his father; GER disease in his father; Heart Problems in his  father.    ROS:  Please see the history of present illness.    Review of Systems: Constitutional:  denies fever, chills, diaphoresis, appetite change and fatigue.  HEENT: denies photophobia, eye pain, redness, hearing loss, ear pain, congestion, sore throat, rhinorrhea, sneezing, neck pain, neck stiffness and tinnitus.  Respiratory: denies SOB, DOE, cough, chest tightness, and wheezing.  Cardiovascular: denies chest pain, palpitations and leg swelling.  Gastrointestinal: denies nausea, vomiting, abdominal pain, diarrhea, constipation, blood in stool.  Genitourinary: denies dysuria, urgency, frequency, hematuria, flank pain and difficulty urinating.  Musculoskeletal: denies  myalgias, back pain, joint swelling, arthralgias and gait problem.   Skin: denies pallor, rash and  wound.  Neurological: denies dizziness, seizures, syncope, weakness, light-headedness, numbness and headaches.   Hematological: denies adenopathy, easy bruising, personal or family bleeding history.  Psychiatric/ Behavioral: denies suicidal ideation, mood changes, confusion, nervousness, sleep disturbance and agitation.       All other systems are reviewed and negative.    PHYSICAL EXAM: VS:  BP 108/82   Pulse 76   Ht 5\' 11"  (1.803 m)   Wt 200 lb 6.4 oz (90.9 kg)   BMI 27.95 kg/m  , BMI Body mass index is 27.95 kg/m. GEN: Well nourished, well developed, in no acute distress  HEENT: normal  Neck: no JVD, carotid bruits, or masses Cardiac: RRR; no murmurs, rubs, or gallops,no edema  Respiratory:  clear to auscultation bilaterally, normal work of breathing GI: soft, nontender, nondistended, + BS MS: no deformity or atrophy  Skin: warm and dry, no rash Neuro:  Strength and sensation are intact Psych: normal   EKG:  EKG is ordered today. The ekg ordered today demonstrates  NSR at 76.  Normal ECG   Recent Labs: No results found for requested labs within last 8760 hours.    Lipid Panel    Component  Value Date/Time   CHOL 105 (L) 04/03/2016 0812   CHOL 90 03/15/2014 0743   TRIG 70 04/03/2016 0812   TRIG 86 03/15/2014 0743   HDL 44 04/03/2016 0812   HDL 37 (L) 03/15/2014 0743   CHOLHDL 2.4 04/03/2016 0812   VLDL 14 04/03/2016 0812   LDLCALC 47 04/03/2016 0812   LDLCALC 36 03/15/2014 0743      Wt Readings from Last 3 Encounters:  05/30/17 200 lb 6.4 oz (90.9 kg)  02/19/17 205 lb (93 kg)  04/03/16 199 lb 9.6 oz (90.5 kg)      Other studies Reviewed: Additional studies/ records that were reviewed today include: . Review of the above records demonstrates:    ASSESSMENT AND PLAN:  1. 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   No angina .   Will continue  Brilinta to 60 mg BID.   We discussed changing him to Plavix. He would like to stay with Brilinta. Continue ASA 81 mg a day .   2. Hyperlipidemia  -    Will recheck today  Continue Atorvastatin 40 mg a day   Current medicines are reviewed at length with the patient today.  The patient does not have concerns regarding medicines.  The following changes have been made:  no change  Labs/ tests ordered today include:  No orders of the defined types were placed in this encounter.    Disposition:   FU with me in 1 year      Mertie Moores, MD  05/30/2017 9:46 AM    [facility] [address], [place],   [address] Phone: [phone]; Fax: [phone]

## 2017-05-30 NOTE — Patient Instructions (Signed)
Medication Instructions:  Your physician recommends that you continue on your current medications as directed. Please refer to the Current Medication list given to you today.   Labwork: TODAY - basic metabolic panel, cholesterol, liver panel   Testing/Procedures: None Ordered   Follow-Up: Your physician wants you to follow-up in: 1 year with Dr. Acie Fredrickson.  You will receive a reminder letter in the mail two months in advance. If you don't receive a letter, please call our office to schedule the follow-up appointment.   If you need a refill on your cardiac medications before your next appointment, please call your pharmacy.   Thank you for choosing [facility]! Christen Bame, RN [phone]

## 2017-06-13 ENCOUNTER — Other Ambulatory Visit: Payer: Self-pay | Admitting: Cardiovascular Disease

## 2017-08-01 ENCOUNTER — Other Ambulatory Visit: Payer: Self-pay | Admitting: Cardiovascular Disease

## 2017-08-02 DIAGNOSIS — Z23 Encounter for immunization: Secondary | ICD-10-CM | POA: Diagnosis not present

## 2017-08-06 DIAGNOSIS — Z008 Encounter for other general examination: Secondary | ICD-10-CM | POA: Diagnosis not present

## 2017-08-07 DIAGNOSIS — Z125 Encounter for screening for malignant neoplasm of prostate: Secondary | ICD-10-CM | POA: Diagnosis not present

## 2017-08-07 DIAGNOSIS — R7301 Impaired fasting glucose: Secondary | ICD-10-CM | POA: Diagnosis not present

## 2017-08-07 DIAGNOSIS — I10 Essential (primary) hypertension: Secondary | ICD-10-CM | POA: Diagnosis not present

## 2017-08-07 DIAGNOSIS — E7849 Other hyperlipidemia: Secondary | ICD-10-CM | POA: Diagnosis not present

## 2017-08-09 ENCOUNTER — Other Ambulatory Visit: Payer: Self-pay | Admitting: Cardiovascular Disease

## 2017-08-14 DIAGNOSIS — R7301 Impaired fasting glucose: Secondary | ICD-10-CM | POA: Diagnosis not present

## 2017-08-14 DIAGNOSIS — Z0000 Encounter for general adult medical examination without abnormal findings: Secondary | ICD-10-CM | POA: Diagnosis not present

## 2017-08-14 DIAGNOSIS — E7849 Other hyperlipidemia: Secondary | ICD-10-CM | POA: Diagnosis not present

## 2017-08-14 DIAGNOSIS — I2510 Atherosclerotic heart disease of native coronary artery without angina pectoris: Secondary | ICD-10-CM | POA: Diagnosis not present

## 2017-08-14 DIAGNOSIS — Z1389 Encounter for screening for other disorder: Secondary | ICD-10-CM | POA: Diagnosis not present

## 2017-08-14 DIAGNOSIS — I10 Essential (primary) hypertension: Secondary | ICD-10-CM | POA: Diagnosis not present

## 2017-08-15 DIAGNOSIS — Z1212 Encounter for screening for malignant neoplasm of rectum: Secondary | ICD-10-CM | POA: Diagnosis not present

## 2017-08-15 DIAGNOSIS — Z6829 Body mass index (BMI) 29.0-29.9, adult: Secondary | ICD-10-CM | POA: Diagnosis not present

## 2017-08-15 DIAGNOSIS — E782 Mixed hyperlipidemia: Secondary | ICD-10-CM | POA: Diagnosis not present

## 2017-08-15 DIAGNOSIS — Z719 Counseling, unspecified: Secondary | ICD-10-CM | POA: Diagnosis not present

## 2017-08-15 DIAGNOSIS — I2510 Atherosclerotic heart disease of native coronary artery without angina pectoris: Secondary | ICD-10-CM | POA: Diagnosis not present

## 2017-08-15 DIAGNOSIS — Z008 Encounter for other general examination: Secondary | ICD-10-CM | POA: Diagnosis not present

## 2017-08-15 DIAGNOSIS — I10 Essential (primary) hypertension: Secondary | ICD-10-CM | POA: Diagnosis not present

## 2017-09-05 ENCOUNTER — Other Ambulatory Visit: Payer: Self-pay | Admitting: Gastroenterology

## 2017-09-27 DIAGNOSIS — Z6828 Body mass index (BMI) 28.0-28.9, adult: Secondary | ICD-10-CM | POA: Diagnosis not present

## 2017-09-27 DIAGNOSIS — R05 Cough: Secondary | ICD-10-CM | POA: Diagnosis not present

## 2017-09-27 DIAGNOSIS — J189 Pneumonia, unspecified organism: Secondary | ICD-10-CM | POA: Diagnosis not present

## 2017-10-17 ENCOUNTER — Other Ambulatory Visit: Payer: Self-pay | Admitting: Cardiovascular Disease

## 2017-10-17 MED ORDER — NITROGLYCERIN 0.4 MG SL SUBL: 0.4000 mg | SUBLINGUAL_TABLET | SUBLINGUAL | 3 refills | Status: AC | PRN

## 2017-11-06 DIAGNOSIS — J189 Pneumonia, unspecified organism: Secondary | ICD-10-CM | POA: Diagnosis not present

## 2017-12-04 DIAGNOSIS — R05 Cough: Secondary | ICD-10-CM | POA: Diagnosis not present

## 2017-12-04 DIAGNOSIS — J111 Influenza due to unidentified influenza virus with other respiratory manifestations: Secondary | ICD-10-CM | POA: Diagnosis not present

## 2017-12-04 DIAGNOSIS — I10 Essential (primary) hypertension: Secondary | ICD-10-CM | POA: Diagnosis not present

## 2017-12-04 DIAGNOSIS — Z6829 Body mass index (BMI) 29.0-29.9, adult: Secondary | ICD-10-CM | POA: Diagnosis not present

## 2018-01-14 DIAGNOSIS — I82412 Acute embolism and thrombosis of left femoral vein: Secondary | ICD-10-CM | POA: Diagnosis not present

## 2018-01-14 DIAGNOSIS — I82432 Acute embolism and thrombosis of left popliteal vein: Secondary | ICD-10-CM | POA: Diagnosis not present

## 2018-01-14 DIAGNOSIS — I10 Essential (primary) hypertension: Secondary | ICD-10-CM | POA: Diagnosis not present

## 2018-01-14 DIAGNOSIS — I2510 Atherosclerotic heart disease of native coronary artery without angina pectoris: Secondary | ICD-10-CM | POA: Diagnosis not present

## 2018-01-14 DIAGNOSIS — I82409 Acute embolism and thrombosis of unspecified deep veins of unspecified lower extremity: Secondary | ICD-10-CM | POA: Diagnosis not present

## 2018-01-14 DIAGNOSIS — M79662 Pain in left lower leg: Secondary | ICD-10-CM | POA: Diagnosis not present

## 2018-01-14 DIAGNOSIS — Z7901 Long term (current) use of anticoagulants: Secondary | ICD-10-CM | POA: Diagnosis not present

## 2018-01-15 ENCOUNTER — Telehealth: Payer: Self-pay

## 2018-01-15 NOTE — Telephone Encounter (Addendum)
DOD call Call received from Dr. Raul Del office.  Pt presented with DVT.  Dr. Brigitte Pulse requesting guidance for medication management.   DOD spoke with Pt primary cardiologist-advised to have Pt take ASA 81 mg daily with Eliquis 5 mg BID.  DC Brilinta.  Dr. Brigitte Pulse aware of advisement.     I spoke with both Drs Brigitte Pulse and Nahser.  We are all in agreement to stop brilinta.  Continue ASA 81mg  daily and add eliquis 5mg  BID. Dr Acie Fredrickson did not feel that additional follow-up (other than previously scheduled) was required with him.  Dr Brigitte Pulse to follow-up patient and consider hypercoagulability workup.  Thompson Grayer MD, [facility] 01/15/2018 5:19 PM

## 2018-01-21 DIAGNOSIS — I10 Essential (primary) hypertension: Secondary | ICD-10-CM | POA: Diagnosis not present

## 2018-01-21 DIAGNOSIS — I2510 Atherosclerotic heart disease of native coronary artery without angina pectoris: Secondary | ICD-10-CM | POA: Diagnosis not present

## 2018-01-21 DIAGNOSIS — Z7901 Long term (current) use of anticoagulants: Secondary | ICD-10-CM | POA: Diagnosis not present

## 2018-01-21 DIAGNOSIS — E782 Mixed hyperlipidemia: Secondary | ICD-10-CM | POA: Diagnosis not present

## 2018-01-21 DIAGNOSIS — Z719 Counseling, unspecified: Secondary | ICD-10-CM | POA: Diagnosis not present

## 2018-01-21 DIAGNOSIS — Z008 Encounter for other general examination: Secondary | ICD-10-CM | POA: Diagnosis not present

## 2018-01-21 DIAGNOSIS — I82409 Acute embolism and thrombosis of unspecified deep veins of unspecified lower extremity: Secondary | ICD-10-CM | POA: Diagnosis not present

## 2018-01-31 DIAGNOSIS — L821 Other seborrheic keratosis: Secondary | ICD-10-CM | POA: Diagnosis not present

## 2018-02-19 ENCOUNTER — Other Ambulatory Visit: Payer: Self-pay | Admitting: Gastroenterology

## 2018-02-25 ENCOUNTER — Telehealth: Payer: Self-pay

## 2018-02-25 NOTE — Telephone Encounter (Signed)
Routing to [facility] refill box 

## 2018-02-25 NOTE — Telephone Encounter (Signed)
Refill request from [facility] for Elavil 10mg  take 1 tab po qhs, qty 90, phone [phone], fax [phone]

## 2018-02-27 NOTE — Telephone Encounter (Signed)
Completed fax request

## 2018-03-27 DIAGNOSIS — Z7901 Long term (current) use of anticoagulants: Secondary | ICD-10-CM | POA: Diagnosis not present

## 2018-03-27 DIAGNOSIS — Z86718 Personal history of other venous thrombosis and embolism: Secondary | ICD-10-CM | POA: Diagnosis not present

## 2018-03-27 DIAGNOSIS — Z6829 Body mass index (BMI) 29.0-29.9, adult: Secondary | ICD-10-CM | POA: Diagnosis not present

## 2018-03-27 DIAGNOSIS — I2510 Atherosclerotic heart disease of native coronary artery without angina pectoris: Secondary | ICD-10-CM | POA: Diagnosis not present

## 2018-04-07 DIAGNOSIS — M25562 Pain in left knee: Secondary | ICD-10-CM | POA: Diagnosis not present

## 2018-04-07 DIAGNOSIS — Z6829 Body mass index (BMI) 29.0-29.9, adult: Secondary | ICD-10-CM | POA: Diagnosis not present

## 2018-04-29 DIAGNOSIS — I10 Essential (primary) hypertension: Secondary | ICD-10-CM | POA: Diagnosis not present

## 2018-04-29 DIAGNOSIS — Z008 Encounter for other general examination: Secondary | ICD-10-CM | POA: Diagnosis not present

## 2018-04-29 DIAGNOSIS — E782 Mixed hyperlipidemia: Secondary | ICD-10-CM | POA: Diagnosis not present

## 2018-04-29 DIAGNOSIS — Z719 Counseling, unspecified: Secondary | ICD-10-CM | POA: Diagnosis not present

## 2018-05-07 ENCOUNTER — Other Ambulatory Visit: Payer: Self-pay | Admitting: Cardiovascular Disease

## 2018-05-22 ENCOUNTER — Encounter: Payer: Self-pay | Admitting: Cardiovascular Disease

## 2018-06-08 ENCOUNTER — Other Ambulatory Visit: Payer: Self-pay | Admitting: Cardiovascular Disease

## 2018-06-16 ENCOUNTER — Ambulatory Visit: Payer: BLUE CROSS/BLUE SHIELD | Admitting: Cardiovascular Disease

## 2018-06-16 ENCOUNTER — Encounter: Payer: Self-pay | Admitting: Cardiovascular Disease

## 2018-06-16 VITALS — BP 140/120 | HR 73 | Ht 71.0 in | Wt 212.4 lb

## 2018-06-16 DIAGNOSIS — I825Z2 Chronic embolism and thrombosis of unspecified deep veins of left distal lower extremity: Secondary | ICD-10-CM | POA: Diagnosis not present

## 2018-06-16 DIAGNOSIS — I10 Essential (primary) hypertension: Secondary | ICD-10-CM | POA: Diagnosis not present

## 2018-06-16 DIAGNOSIS — I2510 Atherosclerotic heart disease of native coronary artery without angina pectoris: Secondary | ICD-10-CM

## 2018-06-16 LAB — BASIC METABOLIC PANEL
BUN/Creatinine Ratio: 8 — ABNORMAL LOW (ref 9–20)
BUN: 6 mg/dL (ref 6–24)
CALCIUM: 9.3 mg/dL (ref 8.7–10.2)
CO2: 22 mmol/L (ref 20–29)
CREATININE: 0.75 mg/dL — AB (ref 0.76–1.27)
Chloride: 105 mmol/L (ref 96–106)
GFR calc Af Amer: 124 mL/min/{1.73_m2} (ref >59)
GFR, EST NON AFRICAN AMERICAN: 107 mL/min/{1.73_m2} (ref >59)
Glucose: 98 mg/dL (ref 65–99)
Potassium: 4 mmol/L (ref 3.5–5.2)
Sodium: 143 mmol/L (ref 134–144)

## 2018-06-16 LAB — HEPATIC FUNCTION PANEL
ALBUMIN: 4.4 g/dL (ref 3.5–5.5)
ALT: 30 IU/L (ref 0–44)
AST: 22 IU/L (ref 0–40)
Alkaline Phosphatase: 78 IU/L (ref 39–117)
Bilirubin Total: 0.4 mg/dL (ref 0.0–1.2)
Bilirubin, Direct: 0.13 mg/dL (ref 0.00–0.40)
TOTAL PROTEIN: 7.1 g/dL (ref 6.0–8.5)

## 2018-06-16 LAB — LIPID PANEL
Chol/HDL Ratio: 2.4 ratio (ref 0.0–5.0)
Cholesterol, Total: 108 mg/dL (ref 100–199)
HDL: 45 mg/dL (ref >39)
LDL CALC: 48 mg/dL (ref 0–99)
Triglycerides: 77 mg/dL (ref 0–149)
VLDL CHOLESTEROL CAL: 15 mg/dL (ref 5–40)

## 2018-06-16 MED ORDER — POTASSIUM CHLORIDE ER 10 MEQ PO TBCR: 10.0000 meq | EXTENDED_RELEASE_TABLET | Freq: Every day | ORAL | 3 refills | Status: DC

## 2018-06-16 MED ORDER — HYDROCHLOROTHIAZIDE 25 MG PO TABS: 25.0000 mg | ORAL_TABLET | Freq: Every day | ORAL | 3 refills | Status: DC

## 2018-06-16 NOTE — Patient Instructions (Signed)
Medication Instructions:  Your physician has recommended you make the following change in your medication:   START HCTZ (Hydrochlorothiazide) 25 mg once daily START Kdur (Potassium supplement) 10 mEq once daily   Labwork: TODAY - cholesterol, liver panel, basic metabolic panel  Your physician recommends that you return for lab work in: 3 weeks for basic metabolic panel   Testing/Procedures: None Ordered   Follow-Up: Your physician recommends that you schedule a follow-up appointment in: 3 months with a PA or Nurse Practitioner on Dr. Elmarie Shiley team   If you need a refill on your cardiac medications before your next appointment, please call your pharmacy.   Thank you for choosing [facility]! Christen Bame, RN [phone]

## 2018-06-16 NOTE — Progress Notes (Signed)
Cardiology Office Note   Date:  06/16/2018   ID:  Eric Setting., DOB Jan 24, 1966, MRN [id number]  PCP:  Marton Redwood, MD  Cardiologist:   Mertie Moores, MD   No chief complaint on file.  1. 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 Hyperlipidemia   History of Present Illness: Eric Fisher is a 52 year old gentleman with a history of coronary artery disease. 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 We treated with Brilinta, aspirin, and Coumadin. He's had some atypical episodes of chest pain since last saw him. None of his symptoms were similar to his presenting episodes of angina. He exercises on a daily basis and does not have any episodes of chest pain.  He has been maintained on Brilinta and Coumadin he seems to be doing fairly well. He is quite active and has not had any recurrent episodes of angina.  Dec. 9, 2013 When I last saw him several months ago we discontinued his Coumadin. We have maintained the Brilinta and ASA 81 QD. He has been able to all of his normal activities without any chest pain.  March 16, 2013:  He denies any chest pain. He is getting some regular exercise.  March 18, 2014:  Eric Fisher is doing very well. He's not had any episodes of chest pain or shortness breath. He has remained very active. He's been very busy at work. He's looking forward to getting back into an exercise regimen this summer.   April 04, 2015:   Eric Valliant Kylie Gros. is a 52 y.o. male who presents for follow up of his CAD Doing well.  No CP .    April 03, 2016:  Eric Fisher is doing well.   Has had dx of CAD for 5 years.   Kawasaki's disease  Exercising some .   Putting in long hours at work .    Aug. 23, 2018  Eric Fisher is doing well. He has a history of coronary artery disease-Kawasaki's disease. He has been maintained on low-dose Brilinta and is doing quite well. 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   September night, 2019: Eric Fisher is seen today for follow-up of his coronary artery disease ( Kawasaki's disease) and DVT.  He was found to have a DVT on January 24, 2018: The Brilinta was stopped and he was started on Eliquis 5 mg twice a day.  It sounds like he had clotting factors measured by Eric Fisher.  I do not have the results but it sounds like he did have an abnormality which would cause him to be hypercoagulable.  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  Blood Pressure is  elevated today.    Past Medical History:  Diagnosis Date  . Anxiety   . Complication of anesthesia   . Coronary artery disease    ectatic coronaries per cath in August 2013 - managed with Brilinta and aspirin  . Gallbladder polyp 05/2011   on abd u/s  . Gallstones 05/2011   on abd u/s  . GERD (gastroesophageal reflux disease)   . Hemorrhoid    s/p surgery August 2013  . Hypertension    he denies  . MI, acute, non ST segment elevation ([place]) 03/2011   thrombus of LAD  . PONV (postoperative nausea and vomiting)     Past Surgical History:  Procedure Laterality Date  . BRAVO [place] STUDY  02/2008  Day 1, 155 episodes of acid reflux with longest episode 29 minutes, De[place]ter 41.7. Day 2, 51 episodes, [place]ter 12.4. Two of three episodes of chest pain correlated with episode of acid reflux. Study done OFF PPI.  Marland Kitchen CARDIAC CATHETERIZATION  03/11/2011   This demonstrated marked ectasia of the proximal LAD with suggestion of a large filling defect consistent  with thrombus.  There was clot extending into the first septal   perforating branch which had slow flow.   Marland Kitchen CARDIAC CATHETERIZATION  05/19/2012   Angiographic data- large and slightly ectatic left main, no stenosis or thrombi, pLAD extremely large and ectatic, no thombi, remained LAD normal; normal LCx without stenosis or ectasia; minor luminal RCA irregularities, dRCA mildly ectatic; PDA and PLSA normal size.   .  CHOLECYSTECTOMY N/A 04/17/2013   Procedure: LAPAROSCOPIC CHOLECYSTECTOMY;  Surgeon: Eric So, MD;  Location: [facility];  Service: General;  Laterality: N/A;  . ESOPHAGOGASTRODUODENOSCOPY  02/2008   small hh  . ESOPHAGOGASTRODUODENOSCOPY  06/19/2011   Eric Fisher esophagus, small hiatal hernia o/w normal stomach  . HEMORRHOID SURGERY  05/23/2012   Procedure: HEMORRHOIDECTOMY;  Surgeon: Eric So, MD;  Location: [facility];  Service: General;  Laterality: N/A;  . HEMORROIDECTOMY    . KNEE ARTHROSCOPY    . LEFT HEART CATHETERIZATION WITH CORONARY ANGIOGRAM N/A 05/19/2012   Procedure: LEFT HEART CATHETERIZATION WITH CORONARY ANGIOGRAM;  Surgeon: Eric Headings, MD;  Location: [facility] CATH LAB;  Service: Cardiovascular;  Laterality: N/A;  . TRANSTHORACIC ECHOCARDIOGRAM  03/13/2011   Left ventricle: Mild distal septal hypokinesis The cavity size was normal. Systolic function was normal. The estimated ejection fraction was 55%. Wall motion was normal     Current Outpatient Medications  Medication Sig Dispense Refill  . acetaminophen (TYLENOL) 500 MG tablet Take 1,000 mg by mouth every 6 (six) hours as needed. For pain    . ALPRAZolam (XANAX) 0.5 MG tablet Take 0.5 mg by mouth 2 (two) times daily as needed. For anxiety     . amitriptyline (ELAVIL) 10 MG tablet TAKE 1 TABLET BY MOUTH AT  BEDTIME 90 tablet 3  . apixaban (ELIQUIS) 5 MG TABS tablet Take 5 mg by mouth 2 (two) times daily.    Marland Kitchen aspirin 81 MG tablet Take 81 mg by mouth daily.    Marland Kitchen atorvastatin (LIPITOR) 40 MG tablet TAKE 1 TABLET BY MOUTH  DAILY 90 tablet 3  . metoprolol tartrate (LOPRESSOR) 25 MG tablet Take 1 tablet (25 mg total) by mouth 2 (two) times daily. Please keep upcoming appointment for more refills, thanks! 180 tablet 0  . Multiple Vitamin (MULTIVITAMIN WITH MINERALS) TABS Take 1 tablet by mouth every morning.    . nitroGLYCERIN (NITROSTAT) 0.4 MG SL tablet Place 1 tablet (0.4 mg total) under the tongue every 5 (five) minutes  x 3 doses as needed. Chest pain 25 tablet 3  . RABEprazole (ACIPHEX) 20 MG tablet TAKE 1 TABLET BY MOUTH  DAILY 90 tablet 3   No current facility-administered medications for this visit.     Allergies:   Patient has no known allergies.    Social History:  The patient  reports that he has never smoked. He has never used smokeless tobacco. He reports that he does not drink alcohol or use drugs.   Family History:  The patient's family history includes Arrhythmia in his father; GER disease in his father; Heart Problems in his father.    ROS:   Noted in current history, otherwise review  of systems is negative.   Physical Exam: Blood pressure (!) 140/120, Fisher 73, height 5\' 11"  (1.803 m), weight 212 lb 6.4 oz (96.3 kg), SpO2 98 %.  GEN:  Well nourished, well developed in no acute distress HEENT: Normal NECK: No JVD; No carotid bruits LYMPHATICS: No lymphadenopathy CARDIAC: RR  RESPIRATORY:  Clear to auscultation without rales, wheezing or rhonchi  ABDOMEN: Soft, non-tender, non-distended MUSCULOSKELETAL:  No edema; No deformity  SKIN: Warm and dry NEUROLOGIC:  Alert and oriented x 3   EKG: June 16, 2018: Normal sinus rhythm at 73 beats minute.  Occasional premature ventricular contraction.   Recent Labs: No results found for requested labs within last 8760 hours.    Lipid Panel    Component Value Date/Time   CHOL 104 05/30/2017 0958   CHOL 90 03/15/2014 0743   TRIG 67 05/30/2017 0958   TRIG 86 03/15/2014 0743   HDL 40 05/30/2017 0958   HDL 37 (L) 03/15/2014 0743   CHOLHDL 2.6 05/30/2017 0958   CHOLHDL 2.4 04/03/2016 0812   VLDL 14 04/03/2016 0812   LDLCALC 51 05/30/2017 0958   LDLCALC 36 03/15/2014 0743      Wt Readings from Last 3 Encounters:  06/16/18 212 lb 6.4 oz (96.3 kg)  05/30/17 200 lb 6.4 oz (90.9 kg)  02/19/17 205 lb (93 kg)      Other studies Reviewed: Additional studies/ records that were reviewed today include: . Review of the above  records demonstrates:    ASSESSMENT AND PLAN:  1. Coronary artery disease-  . Heart catheterization in 2012 revealed a pattern consistent with Kawasaki disease.  He had coronary thrombosis.  We initially treated him with Brilinta.  He is now on Eliquis which should work fine to prevent thrombosis. Is not having any angina.  2.  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  2. Hyperlipidemia  -      Check labs at next visit       Current medicines are reviewed at length with the patient today.  The patient does not have concerns regarding medicines.  The following changes have been made:  no change  Labs/ tests ordered today include:  No orders of the defined types were placed in this encounter.    Disposition:   FU with me in 1 year      Mertie Moores, MD  06/16/2018 8:51 AM    [facility] [place], [place], [place]  [address] Phone: [phone]; Fax: [phone]

## 2018-06-24 ENCOUNTER — Other Ambulatory Visit: Payer: Self-pay | Admitting: Cardiovascular Disease

## 2018-07-02 ENCOUNTER — Other Ambulatory Visit: Payer: Self-pay | Admitting: Cardiovascular Disease

## 2018-07-07 ENCOUNTER — Telehealth: Payer: Self-pay | Admitting: Nurse Practitioner

## 2018-07-07 ENCOUNTER — Other Ambulatory Visit: Payer: BLUE CROSS/BLUE SHIELD | Admitting: *Deleted

## 2018-07-07 DIAGNOSIS — I2510 Atherosclerotic heart disease of native coronary artery without angina pectoris: Secondary | ICD-10-CM | POA: Diagnosis not present

## 2018-07-07 DIAGNOSIS — I825Z2 Chronic embolism and thrombosis of unspecified deep veins of left distal lower extremity: Secondary | ICD-10-CM

## 2018-07-07 DIAGNOSIS — I10 Essential (primary) hypertension: Secondary | ICD-10-CM | POA: Diagnosis not present

## 2018-07-07 LAB — BASIC METABOLIC PANEL
BUN / CREAT RATIO: 12 (ref 9–20)
BUN: 11 mg/dL (ref 6–24)
CHLORIDE: 99 mmol/L (ref 96–106)
CO2: 27 mmol/L (ref 20–29)
CREATININE: 0.93 mg/dL (ref 0.76–1.27)
Calcium: 9.2 mg/dL (ref 8.7–10.2)
GFR calc Af Amer: 110 mL/min/{1.73_m2} (ref >59)
GFR, EST NON AFRICAN AMERICAN: 95 mL/min/{1.73_m2} (ref >59)
Glucose: 95 mg/dL (ref 65–99)
Potassium: 4.1 mmol/L (ref 3.5–5.2)
Sodium: 139 mmol/L (ref 134–144)

## 2018-07-07 MED ORDER — POTASSIUM CHLORIDE ER 10 MEQ PO TBCR: 10.0000 meq | EXTENDED_RELEASE_TABLET | Freq: Every day | ORAL | 0 refills | Status: DC

## 2018-07-07 MED ORDER — HYDROCHLOROTHIAZIDE 25 MG PO TABS: 25.0000 mg | ORAL_TABLET | Freq: Every day | ORAL | 2 refills | Status: DC

## 2018-07-07 MED ORDER — HYDROCHLOROTHIAZIDE 25 MG PO TABS: 25.0000 mg | ORAL_TABLET | Freq: Every day | ORAL | 0 refills | Status: DC

## 2018-07-07 MED ORDER — POTASSIUM CHLORIDE ER 10 MEQ PO TBCR: 10.0000 meq | EXTENDED_RELEASE_TABLET | Freq: Every day | ORAL | 2 refills | Status: DC

## 2018-07-07 NOTE — Telephone Encounter (Signed)
Lab results reviewed with patient who verbalized understanding and agreement to continue current medications. Patient requests #30 supply of medications be sent to [facility] because he was only given #30 when he started the meds in early September. He also requests #90 to [facility]. He thanked me for the call.

## 2018-07-07 NOTE — Telephone Encounter (Signed)
-----   Message from Thayer Headings, MD sent at 07/07/2018  4:07 PM EDT ----- Labs are stable

## 2018-07-15 ENCOUNTER — Other Ambulatory Visit: Payer: Self-pay | Admitting: Gastroenterology

## 2018-07-18 ENCOUNTER — Other Ambulatory Visit: Payer: Self-pay

## 2018-07-19 MED ORDER — RABEPRAZOLE SODIUM 20 MG PO TBEC: 20.0000 mg | DELAYED_RELEASE_TABLET | Freq: Every day | ORAL | 3 refills | Status: DC

## 2018-08-08 DIAGNOSIS — L820 Inflamed seborrheic keratosis: Secondary | ICD-10-CM | POA: Diagnosis not present

## 2018-08-19 DIAGNOSIS — Z125 Encounter for screening for malignant neoplasm of prostate: Secondary | ICD-10-CM | POA: Diagnosis not present

## 2018-08-19 DIAGNOSIS — I10 Essential (primary) hypertension: Secondary | ICD-10-CM | POA: Diagnosis not present

## 2018-08-19 DIAGNOSIS — Z0000 Encounter for general adult medical examination without abnormal findings: Secondary | ICD-10-CM | POA: Diagnosis not present

## 2018-08-19 DIAGNOSIS — R82998 Other abnormal findings in urine: Secondary | ICD-10-CM | POA: Diagnosis not present

## 2018-08-19 DIAGNOSIS — R7301 Impaired fasting glucose: Secondary | ICD-10-CM | POA: Diagnosis not present

## 2018-08-26 DIAGNOSIS — I10 Essential (primary) hypertension: Secondary | ICD-10-CM | POA: Diagnosis not present

## 2018-08-26 DIAGNOSIS — Z125 Encounter for screening for malignant neoplasm of prostate: Secondary | ICD-10-CM | POA: Diagnosis not present

## 2018-08-26 DIAGNOSIS — E7849 Other hyperlipidemia: Secondary | ICD-10-CM | POA: Diagnosis not present

## 2018-08-26 DIAGNOSIS — Z1389 Encounter for screening for other disorder: Secondary | ICD-10-CM | POA: Diagnosis not present

## 2018-08-26 DIAGNOSIS — R7301 Impaired fasting glucose: Secondary | ICD-10-CM | POA: Diagnosis not present

## 2018-08-26 DIAGNOSIS — I2510 Atherosclerotic heart disease of native coronary artery without angina pectoris: Secondary | ICD-10-CM | POA: Diagnosis not present

## 2018-08-26 DIAGNOSIS — Z0000 Encounter for general adult medical examination without abnormal findings: Secondary | ICD-10-CM | POA: Diagnosis not present

## 2018-08-27 DIAGNOSIS — Z1212 Encounter for screening for malignant neoplasm of rectum: Secondary | ICD-10-CM | POA: Diagnosis not present

## 2018-09-14 DIAGNOSIS — I10 Essential (primary) hypertension: Secondary | ICD-10-CM | POA: Insufficient documentation

## 2018-09-14 NOTE — Progress Notes (Signed)
Cardiology Office Note:    Date:  09/15/2018   ID:  [name]., DOB 12/30/1965, MRN [id number]  PCP:  Marton Redwood, MD  Cardiologist:  Mertie Moores, MD   Electrophysiologist:  None   Referring MD: Marton Redwood, MD   Chief Complaint  Patient presents with  . Follow-up    CAD     History of Present Illness:    Eric Mark Monti Jilek. is a 52 y.o. 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  He had extensive LAD trombosis in 2012 and was initially treated with Coumadin, ASA, Brilinta.  He was switched to Eliquis earlier this year when he was dx with a DVT.  He was last seen by Dr. Acie Fredrickson in 9/19.  HCTZ was added for elevated BP.     Eric Fisher returns for follow-up.  He is here alone.  Since last seen, he has done well.  He denies chest discomfort, significant shortness of breath, orthopnea, paroxysmal nocturnal dyspnea or significant lower extremity swelling.  He denies syncope.  Prior CV studies:   The following studies were reviewed today:  Cardiac Catheterization 05/19/12 Left Main: The Left main is large and is slightly ectatic.  No stenosis, no thrombi Left anterior Descending: The proximal LAD is extremely large and ectatic.  There are no thrombi.  The remaining LAD is fairly normal. Left Circumflex: normal sized.  No stenosis. No significant ectasia. Right Coronary Artery: large and dominant.  There are minor luminal irregularities.  The distal RCA is mildly ectatic.  The PDA and PLSA are normal size  LV Gram: normal LV function  Echo 03/13/11 Mild dist septal HK, EF 55, no RWMA  Past Medical History:  Diagnosis Date  . Anxiety   . Complication of anesthesia   . Coronary artery disease    ectatic coronaries per cath in August 2013 - managed with Brilinta and aspirin  . Gallbladder polyp 05/2011   on abd u/s  . Gallstones 05/2011   on abd u/s  . GERD (gastroesophageal reflux disease)   . Hemorrhoid    s/p surgery  August 2013  . Hypertension    he denies  . MI, acute, non ST segment elevation ([place]) 03/2011   thrombus of LAD  . PONV (postoperative nausea and vomiting)    Surgical Hx: The patient  has a past surgical history that includes Knee arthroscopy; Hemorroidectomy; transthoracic echocardiogram (03/13/2011); Esophagogastroduodenoscopy (02/2008); BRAVO ph study (02/2008); Esophagogastroduodenoscopy (06/19/2011); Cardiac catheterization (03/11/2011); Cardiac catheterization (05/19/2012); Hemorrhoid surgery (05/23/2012); Cholecystectomy (N/A, 04/17/2013); and left heart catheterization with coronary angiogram (N/A, 05/19/2012).   Current Medications: Current Meds  Medication Sig  . acetaminophen (TYLENOL) 500 MG tablet Take 1,000 mg by mouth every 6 (six) hours as needed. For pain  . ALPRAZolam (XANAX) 0.5 MG tablet Take 0.5 mg by mouth 2 (two) times daily as needed. For anxiety   . amitriptyline (ELAVIL) 10 MG tablet TAKE 1 TABLET BY MOUTH AT  BEDTIME  . apixaban (ELIQUIS) 5 MG TABS tablet Take 5 mg by mouth 2 (two) times daily.  Marland Kitchen aspirin 81 MG tablet Take 81 mg by mouth daily.  Marland Kitchen atorvastatin (LIPITOR) 40 MG tablet TAKE 1 TABLET BY MOUTH  DAILY  . hydrochlorothiazide (HYDRODIURIL) 25 MG tablet Take 1 tablet (25 mg total) by mouth daily.  . metoprolol tartrate (LOPRESSOR) 25 MG tablet TAKE 1 TABLET BY MOUTH 2  TIMES DAILY. PLEASE KEEP  UPCOMING APPOINTMENT FOR  MORE REFILLS  .  Multiple Vitamin (MULTIVITAMIN WITH MINERALS) TABS Take 1 tablet by mouth every morning.  . nitroGLYCERIN (NITROSTAT) 0.4 MG SL tablet Place 1 tablet (0.4 mg total) under the tongue every 5 (five) minutes x 3 doses as needed. Chest pain  . potassium chloride (K-DUR) 10 MEQ tablet Take 1 tablet (10 mEq total) by mouth daily.  . RABEprazole (ACIPHEX) 20 MG tablet TAKE 1 TABLET BY MOUTH  DAILY  . [DISCONTINUED] hydrochlorothiazide (HYDRODIURIL) 25 MG tablet Take 1 tablet (25 mg total) by mouth daily.     Allergies:   Patient has no  known allergies.   Social History   Tobacco Use  . Smoking status: Never Smoker  . Smokeless tobacco: Never Used  Substance Use Topics  . Alcohol use: No  . Drug use: No     Family Hx: The patient's family history includes Arrhythmia in his father; GER disease in his father; Heart Problems in his father. There is no history of Colon cancer.  ROS:   Please see the history of present illness.    ROS All other systems reviewed and are negative.   EKGs/Labs/Other Test Reviewed:    EKG:  EKG is  ordered today.  The ekg ordered today demonstrates normal sinus rhythm, heart rate 78, normal axis, QTC 435, similar to old EKG  Recent Labs: 06/16/2018: ALT 30 07/07/2018: BUN 11; Creatinine, Ser 0.93; Potassium 4.1; Sodium 139   Recent Lipid Panel Lab Results  Component Value Date/Time   CHOL 108 06/16/2018 09:24 AM   CHOL 90 03/15/2014 07:43 AM   TRIG 77 06/16/2018 09:24 AM   TRIG 86 03/15/2014 07:43 AM   HDL 45 06/16/2018 09:24 AM   HDL 37 (L) 03/15/2014 07:43 AM   CHOLHDL 2.4 06/16/2018 09:24 AM   CHOLHDL 2.4 04/03/2016 08:12 AM   LDLCALC 48 06/16/2018 09:24 AM   LDLCALC 36 03/15/2014 07:43 AM   From K[facility] Tool: Cholesterol, total 118.000 m 08/19/2018 HDL 40 MG/DL 08/19/2018 LDL 57.000 mg 08/19/2018 Triglycerides 107.000 08/19/2018 A1C 5.300 % 08/19/2018 Hemoglobin 14.600 g/ 08/19/2018 Creatinine, Serum 0.900 mg/ 08/19/2018 Potassium 4.100 07/07/2018 ALT (SGPT) 32.000 uni 08/19/2018 TSH 1.800 08/19/2018   Physical Exam:    VS:  BP 122/80   Pulse 78   Ht 5\' 11"  (1.803 m)   Wt 218 lb (98.9 kg)   SpO2 98%   BMI 30.40 kg/m     Wt Readings from Last 3 Encounters:  09/15/18 218 lb (98.9 kg)  06/16/18 212 lb 6.4 oz (96.3 kg)  05/30/17 200 lb 6.4 oz (90.9 kg)     Physical Exam  Constitutional: He is oriented to person, place, and time. He appears well-developed and well-nourished. No distress.  HENT:  Head: Normocephalic and atraumatic.  Eyes: No scleral icterus.    Neck: No JVD present.  Cardiovascular: Normal rate and regular rhythm.  No murmur heard. Pulmonary/Chest: Effort normal. He has no rales.  Abdominal: Soft. He exhibits no distension.  Musculoskeletal: He exhibits no edema.  Neurological: He is alert and oriented to person, place, and time.  Skin: Skin is warm and dry.    ASSESSMENT & PLAN:    Kawasaki's disease [facility]) History of prior myocardial infarction related to extensive LAD thrombosis in 2012.  He was initially treated with Coumadin, aspirin and Brilinta.  Coumadin was ultimately discontinued.  He was recently taken off of Brilinta and placed on Apixaban due to lower extremity DVT.  He currently denies anginal symptoms.  Continue aspirin, Apixaban, atorvastatin, metoprolol.  Essential  hypertension The patient's blood pressure is controlled on his current regimen.  Continue current therapy.   Hyperlipidemia, unspecified hyperlipidemia type LDL optimal on most recent lab work.  Continue current Rx.     Dispo:  Return in about 6 months (around 03/17/2019) for Routine Follow Up, w/ Dr. Acie Fredrickson.   Medication Adjustments/Labs and Tests Ordered: Current medicines are reviewed at length with the patient today.  Concerns regarding medicines are outlined above.  Tests Ordered: No orders of the defined types were placed in this encounter.  Medication Changes: No orders of the defined types were placed in this encounter.   Signed, Richardson Dopp, PA-C  09/15/2018 8:52 AM    [facility] [place], [place], [place]  [address] Phone: [phone]; Fax: [phone]

## 2018-09-15 ENCOUNTER — Encounter (INDEPENDENT_AMBULATORY_CARE_PROVIDER_SITE_OTHER): Payer: Self-pay

## 2018-09-15 ENCOUNTER — Ambulatory Visit: Payer: BLUE CROSS/BLUE SHIELD | Admitting: Physician Assistant

## 2018-09-15 ENCOUNTER — Encounter: Payer: Self-pay | Admitting: Physician Assistant

## 2018-09-15 VITALS — BP 122/80 | HR 78 | Ht 71.0 in | Wt 218.0 lb

## 2018-09-15 DIAGNOSIS — I10 Essential (primary) hypertension: Secondary | ICD-10-CM

## 2018-09-15 DIAGNOSIS — M303 Mucocutaneous lymph node syndrome [Kawasaki]: Secondary | ICD-10-CM

## 2018-09-15 DIAGNOSIS — E785 Hyperlipidemia, unspecified: Secondary | ICD-10-CM

## 2018-09-15 NOTE — Patient Instructions (Signed)

## 2018-09-16 NOTE — Addendum Note (Signed)
Addended by: Briant Cedar on: 09/16/2018 09:07 AM   Modules accepted: Orders

## 2018-10-28 DIAGNOSIS — I10 Essential (primary) hypertension: Secondary | ICD-10-CM | POA: Diagnosis not present

## 2018-10-28 DIAGNOSIS — Z008 Encounter for other general examination: Secondary | ICD-10-CM | POA: Diagnosis not present

## 2018-10-28 DIAGNOSIS — E782 Mixed hyperlipidemia: Secondary | ICD-10-CM | POA: Diagnosis not present

## 2018-10-28 DIAGNOSIS — I2510 Atherosclerotic heart disease of native coronary artery without angina pectoris: Secondary | ICD-10-CM | POA: Diagnosis not present

## 2019-02-11 ENCOUNTER — Other Ambulatory Visit: Payer: Self-pay | Admitting: Cardiovascular Disease

## 2019-02-17 ENCOUNTER — Encounter: Payer: Self-pay | Admitting: Internal Medicine

## 2019-03-05 ENCOUNTER — Telehealth: Payer: Self-pay | Admitting: Nurse Practitioner

## 2019-03-05 NOTE — Telephone Encounter (Signed)
Left message for patient to call back regarding appointment with Dr. Acie Fredrickson on 6/8

## 2019-03-09 NOTE — Telephone Encounter (Signed)
Patient returned your call.

## 2019-03-09 NOTE — Telephone Encounter (Signed)
Patient scheduled for virtual visit with Dr. Acie Fredrickson. He will have vital signs available for the visit.   YOUR CARDIOLOGY TEAM HAS ARRANGED FOR AN E-VISIT FOR YOUR APPOINTMENT - PLEASE REVIEW IMPORTANT INFORMATION BELOW SEVERAL DAYS PRIOR TO YOUR APPOINTMENT  Due to the recent COVID-19 pandemic, we are transitioning in-person office visits to tele-medicine visits in an effort to decrease unnecessary exposure to our patients, their families, and staff. These visits are billed to your insurance just like a normal visit is. We also encourage you to sign up for MyChart if you have not already done so. You will need a smartphone if possible. For patients that do not have this, we can still complete the visit using a regular telephone but do prefer a smartphone to enable video when possible. You may have a family member that lives with you that can help. If possible, we also ask that you have a blood pressure cuff and scale at home to measure your blood pressure, heart rate and weight prior to your scheduled appointment. Patients with clinical needs that need an in-person evaluation and testing will still be able to come to the office if absolutely necessary. If you have any questions, feel free to call our office.    YOUR PROVIDER WILL BE USING THE FOLLOWING PLATFORM TO COMPLETE YOUR VISIT: Doxy.Me   . IF USING DOXIMITY or [email or web address].ME - The staff will give you instructions on receiving your link to join the meeting the day of your visit.    2-3 DAYS BEFORE YOUR APPOINTMENT  You will receive a telephone call from one of our [facility] team members - your caller ID may say "Unknown caller." If this is a video visit, we will walk you through how to get the video launched on your phone. We will remind you check your blood pressure, heart rate and weight prior to your scheduled appointment. If you have an Apple Watch or Kardia, please upload any pertinent ECG strips the day before or morning of your appointment to  [facility]. Our staff will also make sure you have reviewed the consent and agree to move forward with your scheduled tele-health visit.     THE DAY OF YOUR APPOINTMENT  Approximately 15 minutes prior to your scheduled appointment, you will receive a telephone call from one of [facility] team - your caller ID may say "Unknown caller."  Our staff will confirm medications, vital signs for the day and any symptoms you may be experiencing. Please have this information available prior to the time of visit start. It may also be helpful for you to have a pad of paper and pen handy for any instructions given during your visit. They will also walk you through joining the smartphone meeting if this is a video visit.    CONSENT FOR TELE-HEALTH VISIT - PLEASE REVIEW  I hereby voluntarily request, consent and authorize [facility] and its employed or contracted physicians, physician assistants, nurse practitioners or other licensed health care professionals (the Practitioner), to provide me with telemedicine health care services (the "Services") as deemed necessary by the treating Practitioner. I acknowledge and consent to receive the Services by the Practitioner via telemedicine. I understand that the telemedicine visit will involve communicating with the Practitioner through live audiovisual communication technology and the disclosure of certain medical information by electronic transmission. I acknowledge that I have been given the opportunity to request an in-person assessment or other available alternative prior to the telemedicine visit and am voluntarily participating in the telemedicine  visit.  I understand that I have the right to withhold or withdraw my consent to the use of telemedicine in the course of my care at any time, without affecting my right to future care or treatment, and that the Practitioner or I may terminate the telemedicine visit at any time. I understand that I have the right to inspect  all information obtained and/or recorded in the course of the telemedicine visit and may receive copies of available information for a reasonable fee.  I understand that some of the potential risks of receiving the Services via telemedicine include:  Marland Kitchen Delay or interruption in medical evaluation due to technological equipment failure or disruption; . Information transmitted may not be sufficient (e.g. poor resolution of images) to allow for appropriate medical decision making by the Practitioner; and/or  . In rare instances, security protocols could fail, causing a breach of personal health information.  Furthermore, I acknowledge that it is my responsibility to provide information about my medical history, conditions and care that is complete and accurate to the best of my ability. I acknowledge that Practitioner's advice, recommendations, and/or decision may be based on factors not within their control, such as incomplete or inaccurate data provided by me or distortions of diagnostic images or specimens that may result from electronic transmissions. I understand that the practice of medicine is not an exact science and that Practitioner makes no warranties or guarantees regarding treatment outcomes. I acknowledge that I will receive a copy of this consent concurrently upon execution via email to the email address I last provided but may also request a printed copy by calling the office of [facility].    I understand that my insurance will be billed for this visit.   I have read or had this consent read to me. . I understand the contents of this consent, which adequately explains the benefits and risks of the Services being provided via telemedicine.  . I have been provided ample opportunity to ask questions regarding this consent and the Services and have had my questions answered to my satisfaction. . I give my informed consent for the services to be provided through the use of telemedicine in my  medical care  By participating in this telemedicine visit I agree to the above.

## 2019-03-15 NOTE — Progress Notes (Signed)
Virtual Visit via Video Note   This visit type was conducted due to national recommendations for restrictions regarding the COVID-19 Pandemic (e.g. social distancing) in an effort to limit this patient's exposure and mitigate transmission in our community.  Due to his co-morbid illnesses, this patient is at least at moderate risk for complications without adequate follow up.  This format is felt to be most appropriate for this patient at this time.  All issues noted in this document were discussed and addressed.  A limited physical exam was performed with this format.  Please refer to the patient's chart for his consent to telehealth for [facility].   Date:  03/16/2019   ID:  [name]., DOB [birth year], MRN [id number]  Patient Location: Home Provider Location: Home  PCP:  Marton Redwood, MD  Cardiologist:  Mertie Moores, MD  Electrophysiologist:  None   Evaluation Performed:  Follow-Up Visit  Chief Complaint:   CAD, Kawasaki disease    1. 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 Hyperlipidemia   Previous notes.  Dresean is a 53 year old gentleman with a history of coronary artery disease. 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 We treated with Brilinta, aspirin, and Coumadin. He's had some atypical episodes of chest pain since last saw him. None of his symptoms were similar to his presenting episodes of angina. He exercises on a daily basis and does not have any episodes of chest pain.  He has been maintained on Brilinta and Coumadin he seems to be doing fairly well. He is quite active and has not had any recurrent episodes of angina.  Dec. 9, 2013 When I last saw him several months ago we discontinued his Coumadin. We have maintained the Brilinta and ASA 81 QD. He has been able to all of his normal activities without any chest pain.  March 16, 2013:  He denies any chest pain.  He is getting some regular exercise.  March 18, 2014:  [name] is doing very well. He's not had any episodes of chest pain or shortness breath. He has remained very active. He's been very busy at work. He's looking forward to getting back into an exercise regimen this summer.   April 04, 2015:   Doni Widmer Latravis Grine. is a 53 y.o. male who presents for follow up of his CAD Doing well.  No CP .    April 03, 2016:  Baldemar is doing well.   Has had dx of CAD for 5 years.   Kawasaki's disease  Exercising some .   Putting in long hours at work .    Aug. 23, 2018  Namari is doing well. He has a history of coronary artery disease-Kawasaki's disease. He has been maintained on low-dose Brilinta and is doing quite well. 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   September night, 2019: [name] is seen today for follow-up of his coronary artery disease ( Kawasaki's disease) and DVT.  He was found to have a DVT on January 24, 2018: The Brilinta was stopped and he was started on Eliquis 5 mg twice a day.  It sounds like he had clotting factors measured by Dr. Brigitte Pulse.  I do not have the results but it sounds like he did have an abnormality which would cause him to be hypercoagulable.  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  Blood Pressure is  elevated  today.   March 16, 2019    Eric Fisher. is a 53 y.o. male with hx of CAD -  Trying to exercise  Had NSTEMI in 2012 -  brilinta was DC'd in April 2019 when he developed a DVT - was changed to Eliquis at that time.    Hx of hyperlipidemia Labs from Nov. 2019  Chol = 118 Trigs = 107 HDL = 40 LDL = 57  On atorva 40 mg a day    The patient does not have symptoms concerning for COVID-19 infection (fever, chills, cough, or new shortness of breath).    Past Medical History:  Diagnosis Date  . Anxiety   . Complication of anesthesia   . Coronary artery disease    ectatic coronaries  per cath in August 2013 - managed with Brilinta and aspirin  . Gallbladder polyp 05/2011   on abd u/s  . Gallstones 05/2011   on abd u/s  . GERD (gastroesophageal reflux disease)   . Hemorrhoid    s/p surgery August 2013  . Hypertension    he denies  . MI, acute, non ST segment elevation ([place]) 03/2011   thrombus of LAD  . PONV (postoperative nausea and vomiting)    Past Surgical History:  Procedure Laterality Date  . BRAVO [place] STUDY  02/2008   Day 1, 155 episodes of acid reflux with longest episode 29 minutes, [place] 41.7. Day 2, 51 episodes, [place] 12.4. Two of three episodes of chest pain correlated with episode of acid reflux. Study done OFF PPI.  Marland Kitchen CARDIAC CATHETERIZATION  03/11/2011   This demonstrated marked ectasia of the proximal LAD with suggestion of a large filling defect consistent  with thrombus.  There was clot extending into the first septal   perforating branch which had slow flow.   Marland Kitchen CARDIAC CATHETERIZATION  05/19/2012   Angiographic data- large and slightly ectatic left main, no stenosis or thrombi, pLAD extremely large and ectatic, no thombi, remained LAD normal; normal LCx without stenosis or ectasia; minor luminal RCA irregularities, dRCA mildly ectatic; PDA and PLSA normal size.   . CHOLECYSTECTOMY N/A 04/17/2013   Procedure: LAPAROSCOPIC CHOLECYSTECTOMY;  Surgeon: Jamesetta So, MD;  Location: [facility];  Service: General;  Laterality: N/A;  . ESOPHAGOGASTRODUODENOSCOPY  02/2008   small hh  . ESOPHAGOGASTRODUODENOSCOPY  06/19/2011   Dr. Vivi Ferns esophagus, small hiatal hernia o/w normal stomach  . HEMORRHOID SURGERY  05/23/2012   Procedure: HEMORRHOIDECTOMY;  Surgeon: Jamesetta So, MD;  Location: [facility];  Service: General;  Laterality: N/A;  . HEMORROIDECTOMY    . KNEE ARTHROSCOPY    . LEFT HEART CATHETERIZATION WITH CORONARY ANGIOGRAM N/A 05/19/2012   Procedure: LEFT HEART CATHETERIZATION WITH CORONARY ANGIOGRAM;  Surgeon: Thayer Headings, MD;  Location[facility] CATH  LAB;  Service: Cardiovascular;  Laterality: N/A;  . TRANSTHORACIC ECHOCARDIOGRAM  03/13/2011   Left ventricle: Mild distal septal hypokinesis The cavity size was normal. Systolic function was normal. The estimated ejection fraction was 55%. Wall motion was normal     Current Meds  Medication Sig  . acetaminophen (TYLENOL) 500 MG tablet Take 1,000 mg by mouth every 6 (six) hours as needed. For pain  . ALPRAZolam (XANAX) 0.5 MG tablet Take 0.5 mg by mouth 2 (two) times daily as needed. For anxiety   . amitriptyline (ELAVIL) 10 MG tablet TAKE 1 TABLET BY MOUTH AT  BEDTIME  . apixaban (ELIQUIS) 5 MG TABS tablet Take 5 mg by mouth 2 (two) times  daily.  . aspirin 81 MG tablet Take 81 mg by mouth daily.  Marland Kitchen atorvastatin (LIPITOR) 40 MG tablet TAKE 1 TABLET BY MOUTH  DAILY  . hydrochlorothiazide (HYDRODIURIL) 25 MG tablet TAKE 1 TABLET BY MOUTH  DAILY  . metoprolol tartrate (LOPRESSOR) 25 MG tablet TAKE 1 TABLET BY MOUTH 2  TIMES DAILY. PLEASE KEEP  UPCOMING APPOINTMENT FOR  MORE REFILLS  . Multiple Vitamin (MULTIVITAMIN WITH MINERALS) TABS Take 1 tablet by mouth every morning.  . nitroGLYCERIN (NITROSTAT) 0.4 MG SL tablet Place 1 tablet (0.4 mg total) under the tongue every 5 (five) minutes x 3 doses as needed. Chest pain  . potassium chloride (K-DUR) 10 MEQ tablet Take 1 tablet (10 mEq total) by mouth daily.  . RABEprazole (ACIPHEX) 20 MG tablet TAKE 1 TABLET BY MOUTH  DAILY     Allergies:   Patient has no known allergies.   Social History   Tobacco Use  . Smoking status: Never Smoker  . Smokeless tobacco: Never Used  Substance Use Topics  . Alcohol use: No  . Drug use: No     Family Hx: The patient's family history includes Arrhythmia in his father; GER disease in his father; Heart Problems in his father. There is no history of Colon cancer.  ROS:   Please see the history of present illness.     All other systems reviewed and are negative.   Prior CV studies:   The following  studies were reviewed today:     Labs/Other Tests and Data Reviewed:    EKG:  No ECG reviewed.  Recent Labs: 06/16/2018: ALT 30 07/07/2018: BUN 11; Creatinine, Ser 0.93; Potassium 4.1; Sodium 139   Recent Lipid Panel Lab Results  Component Value Date/Time   CHOL 108 06/16/2018 09:24 AM   CHOL 90 03/15/2014 07:43 AM   TRIG 77 06/16/2018 09:24 AM   TRIG 86 03/15/2014 07:43 AM   HDL 45 06/16/2018 09:24 AM   HDL 37 (L) 03/15/2014 07:43 AM   CHOLHDL 2.4 06/16/2018 09:24 AM   CHOLHDL 2.4 04/03/2016 08:12 AM   LDLCALC 48 06/16/2018 09:24 AM   LDLCALC 36 03/15/2014 07:43 AM    Wt Readings from Last 3 Encounters:  03/16/19 205 lb (93 kg)  09/15/18 218 lb (98.9 kg)  06/16/18 212 lb 6.4 oz (96.3 kg)     Objective:    Vital Signs:  BP 124/73 (BP Location: Right Arm, Patient Position: Sitting, Cuff Size: Normal)   Pulse 90   Temp 98.3 F (36.8 C)   Ht 6' (1.829 m)   Wt 205 lb (93 kg)   SpO2 98%   BMI 27.80 kg/m    VITAL SIGNS:  reviewed GEN:  no acute distress EYES:  sclerae anicteric, EOMI - Extraocular Movements Intact RESPIRATORY:  normal respiratory effort, symmetric expansion CARDIOVASCULAR:  no peripheral edema SKIN:  no rash, lesions or ulcers. MUSCULOSKELETAL:  no obvious deformities. NEURO:  alert and oriented x 3, no obvious focal deficit PSYCH:  normal affect  ASSESSMENT & PLAN:    1. CAD - hypercoaguable state.   Coronary thrombosis.  Now on Eliquis  2.   Hyperlipidemia- labs are well controlled.   3.  DVT = continue Eliquis ,    COVID-19 Education: The signs and symptoms of COVID-19 were discussed with the patient and how to seek care for testing (follow up with PCP or arrange E-visit).  The importance of social distancing was discussed today.  Time:   Today, I have spent  21  minutes with the patient with telehealth technology discussing the above problems.     Medication Adjustments/Labs and Tests Ordered: Current medicines are reviewed at length  with the patient today.  Concerns regarding medicines are outlined above.   Tests Ordered: No orders of the defined types were placed in this encounter.   Medication Changes: No orders of the defined types were placed in this encounter.   Disposition:  Follow up in 1 year(s)  Signed, Mertie Moores, MD  03/16/2019 8:07 AM    [facility]

## 2019-03-16 ENCOUNTER — Encounter: Payer: Self-pay | Admitting: Cardiovascular Disease

## 2019-03-16 ENCOUNTER — Other Ambulatory Visit: Payer: Self-pay | Admitting: Gastroenterology

## 2019-03-16 ENCOUNTER — Telehealth (INDEPENDENT_AMBULATORY_CARE_PROVIDER_SITE_OTHER): Payer: BC Managed Care – PPO | Admitting: Cardiovascular Disease

## 2019-03-16 ENCOUNTER — Other Ambulatory Visit: Payer: Self-pay

## 2019-03-16 VITALS — BP 124/73 | HR 90 | Temp 98.3°F | Ht 72.0 in | Wt 205.0 lb

## 2019-03-16 DIAGNOSIS — Z7189 Other specified counseling: Secondary | ICD-10-CM

## 2019-03-16 DIAGNOSIS — I2510 Atherosclerotic heart disease of native coronary artery without angina pectoris: Secondary | ICD-10-CM | POA: Diagnosis not present

## 2019-03-16 DIAGNOSIS — E785 Hyperlipidemia, unspecified: Secondary | ICD-10-CM

## 2019-03-16 DIAGNOSIS — Z7901 Long term (current) use of anticoagulants: Secondary | ICD-10-CM

## 2019-03-16 DIAGNOSIS — I249 Acute ischemic heart disease, unspecified: Secondary | ICD-10-CM

## 2019-03-16 NOTE — Patient Instructions (Signed)

## 2019-03-24 ENCOUNTER — Telehealth: Payer: Self-pay | Admitting: Internal Medicine

## 2019-03-24 NOTE — Telephone Encounter (Signed)
AB, pts medication( Elavil) was sent to [place] yesterday and the pt feels he's going to run out before he receives it. He would like a small supply sent into [place].

## 2019-03-24 NOTE — Telephone Encounter (Signed)
[phone] PATIENT RETURNED CALL, PLEASE CALL BACK WHEN YOU CAN

## 2019-03-25 MED ORDER — AMITRIPTYLINE HCL 10 MG PO TABS: 10.0000 mg | ORAL_TABLET | Freq: Every day | ORAL | 1 refills | Status: DC

## 2019-03-25 NOTE — Addendum Note (Signed)
Addended by: Annitta Needs on: 03/25/2019 09:54 AM   Modules accepted: Orders

## 2019-03-25 NOTE — Telephone Encounter (Signed)
Done

## 2019-03-31 ENCOUNTER — Other Ambulatory Visit: Payer: Self-pay

## 2019-03-31 ENCOUNTER — Encounter: Payer: Self-pay | Admitting: Internal Medicine

## 2019-03-31 ENCOUNTER — Ambulatory Visit: Payer: BC Managed Care – PPO | Admitting: Internal Medicine

## 2019-03-31 VITALS — BP 131/80 | HR 81 | Temp 97.2°F | Ht 71.5 in | Wt 212.4 lb

## 2019-03-31 DIAGNOSIS — Z1211 Encounter for screening for malignant neoplasm of colon: Secondary | ICD-10-CM

## 2019-03-31 DIAGNOSIS — K219 Gastro-esophageal reflux disease without esophagitis: Secondary | ICD-10-CM | POA: Diagnosis not present

## 2019-03-31 MED ORDER — PEG 3350-KCL-NA BICARB-NACL 420 G PO SOLR: 4000.0000 mL | ORAL | 0 refills | Status: DC

## 2019-03-31 NOTE — Progress Notes (Signed)
Primary Care Physician:  Marton Redwood, MD Primary Gastroenterologist:  Dr. Gala Romney  Pre-Procedure History & Physical: HPI:  Eric Fisher. is a 53 y.o. male here for follow-up of GERD and to set up first ever average risk screening colonoscopy.  GERD continues to be well controlled on AcipHex 20 mg daily.  No dysphagia.  Patient has no bowel symptoms.  Specifically denies rectal bleeding or change in bowel function.  No family history of colon cancer.  No prior colonoscopy.  He is interested in pursuing colonoscopy for screening purposes at this time.  Patient has a history of coronary artery disease.  History of DVT.  Patient now on lifelong Eliquis and aspirin.  No longer taking Brilinta.  Past Medical History:  Diagnosis Date  . Anxiety   . Complication of anesthesia   . Coronary artery disease    ectatic coronaries per cath in August 2013 - managed with Brilinta and aspirin  . Gallbladder polyp 05/2011   on abd u/s  . Gallstones 05/2011   on abd u/s  . GERD (gastroesophageal reflux disease)   . Hemorrhoid    s/p surgery August 2013  . Hypertension    he denies  . MI, acute, non ST segment elevation ([place]) 03/2011   thrombus of LAD  . PONV (postoperative nausea and vomiting)     Past Surgical History:  Procedure Laterality Date  . BRAVO [place] STUDY  02/2008   Day 1, 155 episodes of acid reflux with longest episode 29 minutes, DeMeester 41.7. Day 2, 51 episodes, DeMeester 12.4. Two of three episodes of chest pain correlated with episode of acid reflux. Study done OFF PPI.  Marland Kitchen CARDIAC CATHETERIZATION  03/11/2011   This demonstrated marked ectasia of the proximal LAD with suggestion of a large filling defect consistent  with thrombus.  There was clot extending into the first septal   perforating branch which had slow flow.   Marland Kitchen CARDIAC CATHETERIZATION  05/19/2012   Angiographic data- large and slightly ectatic left main, no stenosis or thrombi, pLAD extremely large and ectatic, no  thombi, remained LAD normal; normal LCx without stenosis or ectasia; minor luminal RCA irregularities, dRCA mildly ectatic; PDA and PLSA normal size.   . CHOLECYSTECTOMY N/A 04/17/2013   Procedure: LAPAROSCOPIC CHOLECYSTECTOMY;  Surgeon: Jamesetta So, MD;  Location: [facility];  Service: General;  Laterality: N/A;  . ESOPHAGOGASTRODUODENOSCOPY  02/2008   small hh  . ESOPHAGOGASTRODUODENOSCOPY  06/19/2011   Dr. Vivi Ferns esophagus, small hiatal hernia o/w normal stomach  . HEMORRHOID SURGERY  05/23/2012   Procedure: HEMORRHOIDECTOMY;  Surgeon: Jamesetta So, MD;  Location: [facility];  Service: General;  Laterality: N/A;  . HEMORROIDECTOMY    . KNEE ARTHROSCOPY    . LEFT HEART CATHETERIZATION WITH CORONARY ANGIOGRAM N/A 05/19/2012   Procedure: LEFT HEART CATHETERIZATION WITH CORONARY ANGIOGRAM;  Surgeon: Thayer Headings, MD;  Location: [facility] CATH LAB;  Service: Cardiovascular;  Laterality: N/A;  . TRANSTHORACIC ECHOCARDIOGRAM  03/13/2011   Left ventricle: Mild distal septal hypokinesis The cavity size was normal. Systolic function was normal. The estimated ejection fraction was 55%. Wall motion was normal    Prior to Admission medications   Medication Sig Start Date End Date Taking? Authorizing Provider  acetaminophen (TYLENOL) 500 MG tablet Take 1,000 mg by mouth every 6 (six) hours as needed. For pain   Yes [provider]  ALPRAZolam (XANAX) 0.5 MG tablet Take 0.5 mg by mouth 2 (two) times daily as needed. For  anxiety  05/03/11  Yes [provider]  amitriptyline (ELAVIL) 10 MG tablet Take 1 tablet (10 mg total) by mouth at bedtime. 03/25/19  Yes Annitta Needs, NP  apixaban (ELIQUIS) 5 MG TABS tablet Take 5 mg by mouth 2 (two) times daily.   Yes [provider]  aspirin 81 MG tablet Take 81 mg by mouth daily.   Yes [provider]  atorvastatin (LIPITOR) 40 MG tablet TAKE 1 TABLET BY MOUTH  DAILY 06/25/18  Yes Nahser, Wonda Cheng, MD  hydrochlorothiazide (HYDRODIURIL) 25  MG tablet TAKE 1 TABLET BY MOUTH  DAILY 02/12/19  Yes Nahser, Wonda Cheng, MD  metoprolol tartrate (LOPRESSOR) 25 MG tablet TAKE 1 TABLET BY MOUTH 2  TIMES DAILY. PLEASE KEEP  UPCOMING APPOINTMENT FOR  MORE REFILLS 07/03/18  Yes Nahser, Wonda Cheng, MD  Multiple Vitamin (MULTIVITAMIN WITH MINERALS) TABS Take 1 tablet by mouth every morning.   Yes [provider]  nitroGLYCERIN (NITROSTAT) 0.4 MG SL tablet Place 1 tablet (0.4 mg total) under the tongue every 5 (five) minutes x 3 doses as needed. Chest pain 10/17/17  Yes Nahser, Wonda Cheng, MD  potassium chloride (K-DUR) 10 MEQ tablet Take 1 tablet (10 mEq total) by mouth daily. 07/07/18  Yes Nahser, Wonda Cheng, MD  RABEprazole (ACIPHEX) 20 MG tablet TAKE 1 TABLET BY MOUTH  DAILY 07/20/18  Yes Mahala Menghini, PA-C    Allergies as of 03/31/2019  . (No Known Allergies)    Family History  Problem Relation Age of Onset  . Arrhythmia Father        a. flutter  . GER disease Father   . Heart Problems Father        nonischemic cardiomyopathy, ASCVD  . Colon cancer Neg Hx     Social History   Socioeconomic History  . Marital status: Single    Spouse name: Not on file  . Number of children: 0  . Years of education: Not on file  . Highest education level: Not on file  Occupational History  . Occupation: maintenance    Employer: [employer]  Social Needs  . Financial resource strain: Not on file  . Food insecurity    Worry: Not on file    Inability: Not on file  . Transportation needs    Medical: Not on file    Non-medical: Not on file  Tobacco Use  . Smoking status: Never Smoker  . Smokeless tobacco: Never Used  Substance and Sexual Activity  . Alcohol use: No  . Drug use: No  . Sexual activity: Not Currently  Lifestyle  . Physical activity    Days per week: Not on file    Minutes per session: Not on file  . Stress: Not on file  Relationships  . Social Herbalist on phone: Not on file    Gets together: Not on file     Attends religious service: Not on file    Active member of club or organization: Not on file    Attends meetings of clubs or organizations: Not on file    Relationship status: Not on file  . Intimate partner violence    Fear of current or ex partner: Not on file    Emotionally abused: Not on file    Physically abused: Not on file    Forced sexual activity: Not on file  Other Topics Concern  . Not on file  Social History Narrative  . Not on file  Review of Systems: See HPI, otherwise negative ROS  Physical Exam: BP 131/80   Pulse 81   Temp (!) 97.2 F (36.2 C) (Oral)   Ht 5' 11.5" (1.816 m)   Wt 212 lb 6.4 oz (96.3 kg)   BMI 29.21 kg/m  General:   Alert,   pleasant and cooperative in NAD Mouth:  No deformity or lesions. Neck:  Supple; no masses or thyromegaly. No significant cervical adenopathy. Lungs:  Clear throughout to auscultation.   No wheezes, crackles, or rhonchi. No acute distress. Heart:  Regular rate and rhythm; no murmurs, clicks, rubs,  or gallops. Abdomen: Non-distended, normal bowel sounds.  Soft and nontender without appreciable mass or hepatosplenomegaly.  Pulses:  Normal pulses noted. Extremities:  Without clubbing or edema. Rectal: Deferred until colonoscopy   Impression/Plan:   Pleasant 53 year old gentleman with well-controlled GERD who is desirous of first ever average her screening colonoscopy. I discussed the risk, benefits, limitations and alternatives.  Given this is a screening examination, he will continue on Eliquis and aspirin without interruption.  Discussed the potential for increased risk of bleeding, particularly if a polyp or polyps are removed.  Questions were answered.  He is agreeable to proceeding.  Recommendations:  Continue AcipHex 20 mg daily for GERD  We will schedule first ever average rescreening colonoscopy-conscious sedation  You will stay on all of your medications including Eliquis and aspirin for the procedure   Further recommendations to follow.    Notice: This dictation was prepared with Dragon dictation along with smaller phrase technology. Any transcriptional errors that result from this process are unintentional and may not be corrected upon review.

## 2019-03-31 NOTE — Patient Instructions (Signed)
Continue AcipHex 20 mg daily for GERD  We will schedule first ever average rescreening colonoscopy-conscious sedation  You will stay on all of your medications including Eliquis and aspirin for the procedure  Further recommendations to follow.

## 2019-04-09 ENCOUNTER — Telehealth: Payer: Self-pay

## 2019-04-09 NOTE — Telephone Encounter (Signed)
Called pt, COVID test scheduled for 05/13/19 at 8:00am (prior to TCS 05/15/19). Pt aware to quarantine at home after test until procedure. Appt letter mailed.

## 2019-05-01 ENCOUNTER — Other Ambulatory Visit: Payer: Self-pay | Admitting: Cardiovascular Disease

## 2019-05-05 DIAGNOSIS — E782 Mixed hyperlipidemia: Secondary | ICD-10-CM | POA: Diagnosis not present

## 2019-05-05 DIAGNOSIS — I10 Essential (primary) hypertension: Secondary | ICD-10-CM | POA: Diagnosis not present

## 2019-05-05 DIAGNOSIS — Z719 Counseling, unspecified: Secondary | ICD-10-CM | POA: Diagnosis not present

## 2019-05-05 DIAGNOSIS — Z008 Encounter for other general examination: Secondary | ICD-10-CM | POA: Diagnosis not present

## 2019-05-07 ENCOUNTER — Other Ambulatory Visit: Payer: Self-pay | Admitting: Cardiovascular Disease

## 2019-05-13 ENCOUNTER — Other Ambulatory Visit: Payer: Self-pay

## 2019-05-13 ENCOUNTER — Other Ambulatory Visit (HOSPITAL_COMMUNITY)
Admission: RE | Admit: 2019-05-13 | Discharge: 2019-05-13 | Disposition: A | Discharge status: Home / Self Care | Payer: BC Managed Care – PPO | Source: Ambulatory Visit | Attending: Internal Medicine | Admitting: Internal Medicine

## 2019-05-13 DIAGNOSIS — Z20828 Contact with and (suspected) exposure to other viral communicable diseases: Secondary | ICD-10-CM | POA: Diagnosis not present

## 2019-05-13 DIAGNOSIS — Z01812 Encounter for preprocedural laboratory examination: Secondary | ICD-10-CM | POA: Insufficient documentation

## 2019-05-13 LAB — SARS CORONAVIRUS 2 (TAT 6-24 HRS): SARS Coronavirus 2: NEGATIVE

## 2019-05-15 ENCOUNTER — Other Ambulatory Visit: Payer: Self-pay

## 2019-05-15 ENCOUNTER — Encounter (HOSPITAL_COMMUNITY)
Admission: RE | Disposition: A | Discharge status: Home / Self Care | Payer: Self-pay | Source: Home / Self Care | Attending: Internal Medicine

## 2019-05-15 ENCOUNTER — Encounter (HOSPITAL_COMMUNITY): Payer: Self-pay | Admitting: *Deleted

## 2019-05-15 ENCOUNTER — Ambulatory Visit (HOSPITAL_COMMUNITY)
Admission: RE | Admit: 2019-05-15 | Discharge: 2019-05-15 | Disposition: A | Discharge status: Home / Self Care | Payer: BC Managed Care – PPO | Attending: Internal Medicine | Admitting: Internal Medicine

## 2019-05-15 DIAGNOSIS — D123 Benign neoplasm of transverse colon: Secondary | ICD-10-CM | POA: Insufficient documentation

## 2019-05-15 DIAGNOSIS — Z9049 Acquired absence of other specified parts of digestive tract: Secondary | ICD-10-CM | POA: Diagnosis not present

## 2019-05-15 DIAGNOSIS — Z79899 Other long term (current) drug therapy: Secondary | ICD-10-CM | POA: Insufficient documentation

## 2019-05-15 DIAGNOSIS — Z8249 Family history of ischemic heart disease and other diseases of the circulatory system: Secondary | ICD-10-CM | POA: Diagnosis not present

## 2019-05-15 DIAGNOSIS — Z1211 Encounter for screening for malignant neoplasm of colon: Secondary | ICD-10-CM | POA: Diagnosis not present

## 2019-05-15 DIAGNOSIS — Z7982 Long term (current) use of aspirin: Secondary | ICD-10-CM | POA: Insufficient documentation

## 2019-05-15 DIAGNOSIS — K219 Gastro-esophageal reflux disease without esophagitis: Secondary | ICD-10-CM | POA: Insufficient documentation

## 2019-05-15 DIAGNOSIS — I2510 Atherosclerotic heart disease of native coronary artery without angina pectoris: Secondary | ICD-10-CM | POA: Diagnosis not present

## 2019-05-15 DIAGNOSIS — Z7901 Long term (current) use of anticoagulants: Secondary | ICD-10-CM | POA: Insufficient documentation

## 2019-05-15 DIAGNOSIS — D124 Benign neoplasm of descending colon: Secondary | ICD-10-CM | POA: Diagnosis not present

## 2019-05-15 DIAGNOSIS — Z8379 Family history of other diseases of the digestive system: Secondary | ICD-10-CM | POA: Diagnosis not present

## 2019-05-15 DIAGNOSIS — I10 Essential (primary) hypertension: Secondary | ICD-10-CM | POA: Insufficient documentation

## 2019-05-15 DIAGNOSIS — F419 Anxiety disorder, unspecified: Secondary | ICD-10-CM | POA: Diagnosis not present

## 2019-05-15 DIAGNOSIS — K635 Polyp of colon: Secondary | ICD-10-CM

## 2019-05-15 DIAGNOSIS — I252 Old myocardial infarction: Secondary | ICD-10-CM | POA: Insufficient documentation

## 2019-05-15 HISTORY — PX: COLONOSCOPY: SHX5424

## 2019-05-15 HISTORY — PX: POLYPECTOMY: SHX5525

## 2019-05-15 SURGERY — COLONOSCOPY
Anesthesia: Moderate Sedation

## 2019-05-15 MED ORDER — MEPERIDINE HCL 50 MG/ML IJ SOLN
INTRAMUSCULAR | Status: AC
  Filled 2019-05-15: qty 1

## 2019-05-15 MED ORDER — STERILE WATER FOR IRRIGATION IR SOLN
Status: DC | PRN
  Administered 2019-05-15: 14:00:00 1.5 mL

## 2019-05-15 MED ORDER — MEPERIDINE HCL 100 MG/ML IJ SOLN
INTRAMUSCULAR | Status: DC | PRN
  Administered 2019-05-15: 10 mg via INTRAVENOUS
  Administered 2019-05-15: 25 mg
  Administered 2019-05-15: 15 mg via INTRAVENOUS

## 2019-05-15 MED ORDER — MIDAZOLAM HCL 5 MG/5ML IJ SOLN
INTRAMUSCULAR | Status: AC
  Filled 2019-05-15: qty 10

## 2019-05-15 MED ORDER — SODIUM CHLORIDE 0.9 % IV SOLN
INTRAVENOUS | Status: DC
  Administered 2019-05-15: 13:00:00 via INTRAVENOUS

## 2019-05-15 MED ORDER — MIDAZOLAM HCL 5 MG/5ML IJ SOLN
INTRAMUSCULAR | Status: DC | PRN
  Administered 2019-05-15 (×4): 1 mg via INTRAVENOUS
  Administered 2019-05-15 (×3): 2 mg via INTRAVENOUS

## 2019-05-15 MED ORDER — MIDAZOLAM HCL 5 MG/5ML IJ SOLN
INTRAMUSCULAR | Status: AC
  Filled 2019-05-15: qty 5

## 2019-05-15 MED ORDER — ONDANSETRON HCL 4 MG/2ML IJ SOLN
INTRAMUSCULAR | Status: AC
  Filled 2019-05-15: qty 2

## 2019-05-15 MED ORDER — ONDANSETRON HCL 4 MG/2ML IJ SOLN
INTRAMUSCULAR | Status: DC | PRN
  Administered 2019-05-15: 4 mg via INTRAVENOUS

## 2019-05-15 NOTE — Op Note (Signed)
[facility] Patient Name: Eric Fisher Procedure Date: 05/15/2019 1:17 PM MRN: [id number] Date of Birth: Oct 19, 1965 Attending MD: Norvel Richards , MD CSN: [id number] Age: 53 Admit Type: Outpatient Procedure:                Colonoscopy Indications:              Screening for colorectal malignant neoplasm Providers:                Norvel Richards, MD, Gerome Sam, RN,                            Aram Candela Referring MD:              Medicines:                Midazolam 11 mg IV, Meperidine 50 mg IV Complications:            No immediate complications. Estimated Blood Loss:     Estimated blood loss was minimal. Procedure:                Pre-Anesthesia Assessment:                           - Prior to the procedure, a History and Physical                            was performed, and patient medications and                            allergies were reviewed. The patient's tolerance of                            previous anesthesia was also reviewed. The risks                            and benefits of the procedure and the sedation                            options and risks were discussed with the patient.                            All questions were answered, and informed consent                            was obtained. Prior Anticoagulants: The patient has                            taken no previous anticoagulant or antiplatelet                            agents. ASA Grade Assessment: II - A patient with                            mild systemic disease. After reviewing the risks  and benefits, the patient was deemed in                            satisfactory condition to undergo the procedure.                           After obtaining informed consent, the colonoscope                            was passed under direct vision. Throughout the                            procedure, the patient's blood pressure, pulse, and           oxygen saturations were monitored continuously. The                            [id number]-[id number] ([id number]) scope was introduced through                            the anus and advanced to the the cecum, identified                            by appendiceal orifice and ileocecal valve. The                            colonoscopy was performed without difficulty. The                            patient tolerated the procedure well. The quality                            of the bowel preparation was adequate. Scope In: 1:54:07 PM Scope Out: 2:35:55 PM Scope Withdrawal Time: 0 hours 12 minutes 14 seconds  Total Procedure Duration: 0 hours 41 minutes 48 seconds  Findings:      The perianal and digital rectal examinations were normal. Patient's       colon was surprisingly elongated and tortuous. Number of maneuvers had       to be employed including changing of the patient's position and and       applying external abdominal pressure to reach the cecum.      Two pedunculated polyps were found in the descending colon and hepatic       flexure. The polyps were 4 to 5 mm in size. These polyps were removed       with a cold snare. Resection and retrieval were complete. Estimated       blood loss was minimal.      The exam was otherwise without abnormality on direct and retroflexion       views. Impression:               - Two 4 to 5 mm polyps in the descending colon and                            at the hepatic flexure, removed with a cold snare.  Resected and retrieved. Elongated, redundant colon.                           - The examination was otherwise normal on direct                            and retroflexion views. Moderate Sedation:      Moderate (conscious) sedation was administered by the endoscopy nurse       and supervised by the endoscopist. The following parameters were       monitored: oxygen saturation, heart rate, blood pressure, respiratory       rate,  EKG, adequacy of pulmonary ventilation, and response to care.       Total physician intraservice time was 53 minutes. Recommendation:           - Patient has a contact number available for                            emergencies. The signs and symptoms of potential                            delayed complications were discussed with the                            patient. Return to normal activities tomorrow.                            Written discharge instructions were provided to the                            patient.                           - Resume previous diet.                           - Patient has a contact number available for                            emergencies. The signs and symptoms of potential                            delayed complications were discussed with the                            patient. Return to normal activities tomorrow.                            Written discharge instructions were provided to the                            patient.                           - Advance diet as tolerated.                           -  Continue present medications.                           - Repeat colonoscopy date to be determined after                            pending pathology results are reviewed for                            surveillance based on pathology results.                           - Return to GI office (date not yet determined). Procedure Code(s):        --- Professional ---                           [phone], Colonoscopy, flexible; with removal of                            tumor(s), polyp(s), or other lesion(s) by snare                            technique                           99153, Moderate sedation; each additional 15                            minutes intraservice time                           99153, Moderate sedation; each additional 15                            minutes intraservice time                           99153, Moderate sedation;  each additional 15                            minutes intraservice time                           G0500, Moderate sedation services provided by the                            same physician or other qualified health care                            professional performing a gastrointestinal                            endoscopic service that sedation supports,                            requiring the presence of an independent trained  observer to assist in the monitoring of the                            patient's level of consciousness and physiological                            status; initial 15 minutes of intra-service time;                            patient age 54 years or older (additional time may                            be reported with [phone], as appropriate) Diagnosis Code(s):        --- Professional ---                           Z12.11, Encounter for screening for malignant                            neoplasm of colon                           K63.5, Polyp of colon CPT copyright 2019 American Medical Association. All rights reserved. The codes documented in this report are preliminary and upon coder review may  be revised to meet current compliance requirements. Cristopher Estimable. Mahasin Riviere, MD Norvel Richards, MD 05/15/2019 2:49:06 PM This report has been signed electronically. Number of Addenda: 0

## 2019-05-15 NOTE — H&P (Signed)
@LOGO @   Primary Care Physician:  Marton Redwood, MD Primary Gastroenterologist:  Dr. Gala Romney  Pre-Procedure History & Physical: HPI:  Eric Fisher. is a 53 y.o. male is here for a screening colonoscopy.  First ever average rescreening examination.  On lifelong Eliquis secondary to ectatic coronaries. Been allowed to stay on Eliquis for this procedure per plan.  Past Medical History:  Diagnosis Date  . Anxiety   . Complication of anesthesia   . Coronary artery disease    ectatic coronaries per cath in August 2013 - managed with Brilinta and aspirin  . Gallbladder polyp 05/2011   on abd u/s  . Gallstones 05/2011   on abd u/s  . GERD (gastroesophageal reflux disease)   . Hemorrhoid    s/p surgery August 2013  . Hypertension    he denies  . MI, acute, non ST segment elevation ([place]) 03/2011   thrombus of LAD  . PONV (postoperative nausea and vomiting)     Past Surgical History:  Procedure Laterality Date  . BRAVO [place] STUDY  02/2008   Day 1, 155 episodes of acid reflux with longest episode 29 minutes, DeMeester 41.7. Day 2, 51 episodes, De[place]eester 12.4. Two of three episodes of chest pain correlated with episode of acid reflux. Study done OFF PPI.  Marland Kitchen CARDIAC CATHETERIZATION  03/11/2011   This demonstrated marked ectasia of the proximal LAD with suggestion of a large filling defect consistent  with thrombus.  There was clot extending into the first septal   perforating branch which had slow flow.   Marland Kitchen CARDIAC CATHETERIZATION  05/19/2012   Angiographic data- large and slightly ectatic left main, no stenosis or thrombi, pLAD extremely large and ectatic, no thombi, remained LAD normal; normal LCx without stenosis or ectasia; minor luminal RCA irregularities, dRCA mildly ectatic; PDA and PLSA normal size.   . CHOLECYSTECTOMY N/A 04/17/2013   Procedure: LAPAROSCOPIC CHOLECYSTECTOMY;  Surgeon: Jamesetta So, MD;  Location: [facility];  Service: General;  Laterality: N/A;  .  ESOPHAGOGASTRODUODENOSCOPY  02/2008   small hh  . ESOPHAGOGASTRODUODENOSCOPY  06/19/2011   Dr. Vivi Ferns esophagus, small hiatal hernia o/w normal stomach  . HEMORRHOID SURGERY  05/23/2012   Procedure: HEMORRHOIDECTOMY;  Surgeon: Jamesetta So, MD;  Location: [facility];  Service: General;  Laterality: N/A;  . HEMORROIDECTOMY    . KNEE ARTHROSCOPY    . LEFT HEART CATHETERIZATION WITH CORONARY ANGIOGRAM N/A 05/19/2012   Procedure: LEFT HEART CATHETERIZATION WITH CORONARY ANGIOGRAM;  Surgeon: Thayer Headings, MD;  Location: [facility] CATH LAB;  Service: Cardiovascular;  Laterality: N/A;  . TRANSTHORACIC ECHOCARDIOGRAM  03/13/2011   Left ventricle: Mild distal septal hypokinesis The cavity size was normal. Systolic function was normal. The estimated ejection fraction was 55%. Wall motion was normal    Prior to Admission medications   Medication Sig Start Date End Date Taking? Authorizing Provider  amitriptyline (ELAVIL) 10 MG tablet Take 1 tablet (10 mg total) by mouth at bedtime. 03/25/19  Yes Annitta Needs, NP  apixaban (ELIQUIS) 5 MG TABS tablet Take 5 mg by mouth 2 (two) times daily.   Yes [provider]  aspirin 81 MG tablet Take 81 mg by mouth every evening.    Yes [provider]  atorvastatin (LIPITOR) 40 MG tablet TAKE 1 TABLET BY MOUTH  DAILY Patient taking differently: Take 40 mg by mouth every evening.  05/04/19  Yes Nahser, Wonda Cheng, MD  fluticasone (FLONASE) 50 MCG/ACT nasal spray Place 1 spray into both  nostrils daily.   Yes [provider]  hydrochlorothiazide (HYDRODIURIL) 25 MG tablet TAKE 1 TABLET BY MOUTH  DAILY Patient taking differently: Take 25 mg by mouth daily.  05/08/19  Yes Nahser, Wonda Cheng, MD  metoprolol tartrate (LOPRESSOR) 25 MG tablet TAKE 1 TABLET BY MOUTH 2  TIMES DAILY. PLEASE KEEP  UPCOMING APPOINTMENT FOR  MORE REFILLS Patient taking differently: Take 25 mg by mouth 2 (two) times daily.  07/03/18  Yes Nahser, Wonda Cheng, MD  Multiple Vitamin  (MULTIVITAMIN WITH MINERALS) TABS Take 1 tablet by mouth 2 (two) times daily.    Yes [provider]  polyethylene glycol-electrolytes (TRILYTE) 420 g solution Take 4,000 mLs by mouth as directed. 03/31/19  Yes Camp Gopal, Cristopher Estimable, MD  potassium chloride (K-DUR) 10 MEQ tablet TAKE 1 TABLET BY MOUTH  DAILY Patient taking differently: Take 10 mEq by mouth daily.  05/08/19  Yes Nahser, Wonda Cheng, MD  RABEprazole (ACIPHEX) 20 MG tablet TAKE 1 TABLET BY MOUTH  DAILY Patient taking differently: Take 20 mg by mouth daily.  07/20/18  Yes Mahala Menghini, PA-C  acetaminophen (TYLENOL) 500 MG tablet Take 1,000 mg by mouth daily as needed for moderate pain.     [provider]  ALPRAZolam Duanne Moron) 0.5 MG tablet Take 0.5 mg by mouth 2 (two) times daily as needed for anxiety.  05/03/11   [provider]  nitroGLYCERIN (NITROSTAT) 0.4 MG SL tablet Place 1 tablet (0.4 mg total) under the tongue every 5 (five) minutes x 3 doses as needed. Chest pain Patient taking differently: Place 0.4 mg under the tongue every 5 (five) minutes x 3 doses as needed for chest pain.  10/17/17   Nahser, Wonda Cheng, MD    Allergies as of 03/31/2019  . (No Known Allergies)    Family History  Problem Relation Age of Onset  . Arrhythmia Father        a. flutter  . GER disease Father   . Heart Problems Father        nonischemic cardiomyopathy, ASCVD  . Colon cancer Neg Hx     Social History   Socioeconomic History  . Marital status: Single    Spouse name: Not on file  . Number of children: 0  . Years of education: Not on file  . Highest education level: Not on file  Occupational History  . Occupation: maintenance    Employer: [employer]  Social Needs  . Financial resource strain: Not on file  . Food insecurity    Worry: Not on file    Inability: Not on file  . Transportation needs    Medical: Not on file    Non-medical: Not on file  Tobacco Use  . Smoking status: Never Smoker  . Smokeless  tobacco: Never Used  Substance and Sexual Activity  . Alcohol use: No  . Drug use: No  . Sexual activity: Not Currently  Lifestyle  . Physical activity    Days per week: Not on file    Minutes per session: Not on file  . Stress: Not on file  Relationships  . Social Herbalist on phone: Not on file    Gets together: Not on file    Attends religious service: Not on file    Active member of club or organization: Not on file    Attends meetings of clubs or organizations: Not on file    Relationship status: Not on file  . Intimate partner violence  Fear of current or ex partner: Not on file    Emotionally abused: Not on file    Physically abused: Not on file    Forced sexual activity: Not on file  Other Topics Concern  . Not on file  Social History Narrative  . Not on file    Review of Systems: See HPI, otherwise negative ROS  Physical Exam: BP (!) 125/94   Pulse 92   Temp 98.5 F (36.9 C) (Oral)   Resp 13   Ht 5\' 11"  (1.803 m)   Wt 95.3 kg   SpO2 99%   BMI 29.29 kg/m  General:   Alert,  Well-developed, well-nourished, pleasant and cooperative in NAD . Lungs:  Clear throughout to auscultation.   No wheezes, crackles, or rhonchi. No acute distress. Heart:  Regular rate and rhythm; no murmurs, clicks, rubs,  or gallops. Abdomen:  Soft, nontender and nondistended. No masses, hepatosplenomegaly or hernias noted. Normal bowel sounds, without guarding, and without rebound.    Impression/Plan: Eric Fisher. is now here to undergo a screening colonoscopy.  First ever average rescreening examination.  Risks, benefits, limitations, imponderables and alternatives regarding colonoscopy have been reviewed with the patient. Questions have been answered. All parties agreeable.     Notice:  This dictation was prepared with Dragon dictation along with smaller phrase technology. Any transcriptional errors that result from this process are unintentional and may not  be corrected upon review.

## 2019-05-15 NOTE — Discharge Instructions (Signed)
Colonoscopy Discharge Instructions  Read the instructions outlined below and refer to this sheet in the next few weeks. These discharge instructions provide you with general information on caring for yourself after you leave the hospital. Your doctor may also give you specific instructions. While your treatment has been planned according to the most current medical practices available, unavoidable complications occasionally occur. If you have any problems or questions after discharge, call Dr. Gala Romney at [phone]. ACTIVITY  You may resume your regular activity, but move at a slower pace for the next 24 hours.   Take frequent rest periods for the next 24 hours.   Walking will help get rid of the air and reduce the bloated feeling in your belly (abdomen).   No driving for 24 hours (because of the medicine (anesthesia) used during the test).    Do not sign any important legal documents or operate any machinery for 24 hours (because of the anesthesia used during the test).  NUTRITION  Drink plenty of fluids.   You may resume your normal diet as instructed by your doctor.   Begin with a light meal and progress to your normal diet. Heavy or fried foods are harder to digest and may make you feel sick to your stomach (nauseated).   Avoid alcoholic beverages for 24 hours or as instructed.  MEDICATIONS  You may resume your normal medications unless your doctor tells you otherwise.  WHAT YOU CAN EXPECT TODAY  Some feelings of bloating in the abdomen.   Passage of more gas than usual.   Spotting of blood in your stool or on the toilet paper.  IF YOU HAD POLYPS REMOVED DURING THE COLONOSCOPY:  No aspirin products for 7 days or as instructed.   No alcohol for 7 days or as instructed.   Eat a soft diet for the next 24 hours.  FINDING OUT THE RESULTS OF YOUR TEST Not all test results are available during your visit. If your test results are not back during the visit, make an appointment  with your caregiver to find out the results. Do not assume everything is normal if you have not heard from your caregiver or the medical facility. It is important for you to follow up on all of your test results.  SEEK IMMEDIATE MEDICAL ATTENTION IF:  You have more than a spotting of blood in your stool.   Your belly is swollen (abdominal distention).   You are nauseated or vomiting.   You have a temperature over 101.   You have abdominal pain or discomfort that is severe or gets worse throughout the day.    Colon polyp information provided  Continue Eliquis uninterrupted  You may see a slight amount of blood with your next bowel movement or 2 but it should go away  Further recommendations to follow pending review of pathology report  I have discussed my findings and recommendations with mom, Vaughan Basta, at [phone]   Colon Polyps  Polyps are tissue growths inside the body. Polyps can grow in many places, including the large intestine (colon). A polyp may be a round bump or a mushroom-shaped growth. You could have one polyp or several. Most colon polyps are noncancerous (benign). However, some colon polyps can become cancerous over time. Finding and removing the polyps early can help prevent this. What are the causes? The exact cause of colon polyps is not known. What increases the risk? You are more likely to develop this condition if you:  Have a family history  of colon cancer or colon polyps.  Are older than 50 or older than 45 if you are African American.  Have inflammatory bowel disease, such as ulcerative colitis or Crohn's disease.  Have certain hereditary conditions, such as: ? Familial adenomatous polyposis. ? Lynch syndrome. ? Turcot syndrome. ? Peutz-Jeghers syndrome.  Are overweight.  Smoke cigarettes.  Do not get enough exercise.  Drink too much alcohol.  Eat a diet that is high in fat and red meat and low in fiber.  Had childhood cancer that was  treated with abdominal radiation. What are the signs or symptoms? Most polyps do not cause symptoms. If you have symptoms, they may include:  Blood coming from your rectum when having a bowel movement.  Blood in your stool. The stool may look dark red or black.  Abdominal pain.  A change in bowel habits, such as constipation or diarrhea. How is this diagnosed? This condition is diagnosed with a colonoscopy. This is a procedure in which a lighted, flexible scope is inserted into the anus and then passed into the colon to examine the area. Polyps are sometimes found when a colonoscopy is done as part of routine cancer screening tests. How is this treated? Treatment for this condition involves removing any polyps that are found. Most polyps can be removed during a colonoscopy. Those polyps will then be tested for cancer. Additional treatment may be needed depending on the results of testing. Follow these instructions at home: Lifestyle  Maintain a healthy weight, or lose weight if recommended by your health care provider.  Exercise every day or as told by your health care provider.  Do not use any products that contain nicotine or tobacco, such as cigarettes and e-cigarettes. If you need help quitting, ask your health care provider.  If you drink alcohol, limit how much you have: ? 0-1 drink a day for women. ? 0-2 drinks a day for men.  Be aware of how much alcohol is in your drink. In the [place] one drink equals one 12 oz bottle of beer (355 mL), one 5 oz glass of wine (148 mL), or one 1 oz shot of hard liquor (44 mL). Eating and drinking   Eat foods that are high in fiber, such as fruits, vegetables, and whole grains.  Eat foods that are high in calcium and vitamin D, such as milk, cheese, yogurt, eggs, liver, fish, and broccoli.  Limit foods that are high in fat, such as fried foods and desserts.  Limit the amount of red meat and processed meat you eat, such as hot dogs,  sausage, bacon, and lunch meats. General instructions  Keep all follow-up visits as told by your health care provider. This is important. ? This includes having regularly scheduled colonoscopies. ? Talk to your health care provider about when you need a colonoscopy. Contact a health care provider if:  You have new or worsening bleeding during a bowel movement.  You have new or increased blood in your stool.  You have a change in bowel habits.  You lose weight for no known reason. Summary  Polyps are tissue growths inside the body. Polyps can grow in many places, including the colon.  Most colon polyps are noncancerous (benign), but some can become cancerous over time.  This condition is diagnosed with a colonoscopy.  Treatment for this condition involves removing any polyps that are found. Most polyps can be removed during a colonoscopy. This information is not intended to replace advice given to you  by your health care provider. Make sure you discuss any questions you have with your health care provider. Document Released: 06/20/2004 Document Revised: 01/09/2018 Document Reviewed: 01/09/2018 Elsevier Patient Education  2020 [employer].

## 2019-05-19 ENCOUNTER — Encounter: Payer: Self-pay | Admitting: Internal Medicine

## 2019-05-27 ENCOUNTER — Encounter (HOSPITAL_COMMUNITY): Payer: Self-pay | Admitting: Internal Medicine

## 2019-06-07 ENCOUNTER — Other Ambulatory Visit: Payer: Self-pay | Admitting: Cardiovascular Disease

## 2019-06-20 ENCOUNTER — Other Ambulatory Visit: Payer: Self-pay | Admitting: Gastroenterology

## 2019-07-13 DIAGNOSIS — H524 Presbyopia: Secondary | ICD-10-CM | POA: Diagnosis not present

## 2019-07-13 DIAGNOSIS — H40013 Open angle with borderline findings, low risk, bilateral: Secondary | ICD-10-CM | POA: Diagnosis not present

## 2019-07-13 DIAGNOSIS — H35371 Puckering of macula, right eye: Secondary | ICD-10-CM | POA: Diagnosis not present

## 2019-07-21 DIAGNOSIS — Z23 Encounter for immunization: Secondary | ICD-10-CM | POA: Diagnosis not present

## 2019-08-24 DIAGNOSIS — Z125 Encounter for screening for malignant neoplasm of prostate: Secondary | ICD-10-CM | POA: Diagnosis not present

## 2019-08-24 DIAGNOSIS — Z0000 Encounter for general adult medical examination without abnormal findings: Secondary | ICD-10-CM | POA: Diagnosis not present

## 2019-08-24 DIAGNOSIS — E7849 Other hyperlipidemia: Secondary | ICD-10-CM | POA: Diagnosis not present

## 2019-08-24 DIAGNOSIS — I10 Essential (primary) hypertension: Secondary | ICD-10-CM | POA: Diagnosis not present

## 2019-08-31 DIAGNOSIS — I2510 Atherosclerotic heart disease of native coronary artery without angina pectoris: Secondary | ICD-10-CM | POA: Diagnosis not present

## 2019-08-31 DIAGNOSIS — Z1331 Encounter for screening for depression: Secondary | ICD-10-CM | POA: Diagnosis not present

## 2019-08-31 DIAGNOSIS — E785 Hyperlipidemia, unspecified: Secondary | ICD-10-CM | POA: Diagnosis not present

## 2019-08-31 DIAGNOSIS — I10 Essential (primary) hypertension: Secondary | ICD-10-CM | POA: Diagnosis not present

## 2019-08-31 DIAGNOSIS — Z0000 Encounter for general adult medical examination without abnormal findings: Secondary | ICD-10-CM | POA: Diagnosis not present

## 2019-08-31 DIAGNOSIS — R7301 Impaired fasting glucose: Secondary | ICD-10-CM | POA: Diagnosis not present

## 2019-08-31 DIAGNOSIS — R82998 Other abnormal findings in urine: Secondary | ICD-10-CM | POA: Diagnosis not present

## 2019-09-16 ENCOUNTER — Encounter: Payer: Self-pay | Admitting: Orthopaedic Surgery

## 2019-09-16 ENCOUNTER — Other Ambulatory Visit: Payer: Self-pay

## 2019-09-16 ENCOUNTER — Ambulatory Visit: Payer: BC Managed Care – PPO | Admitting: Orthopaedic Surgery

## 2019-09-16 ENCOUNTER — Ambulatory Visit: Payer: Self-pay

## 2019-09-16 DIAGNOSIS — G8929 Other chronic pain: Secondary | ICD-10-CM | POA: Diagnosis not present

## 2019-09-16 DIAGNOSIS — M25562 Pain in left knee: Secondary | ICD-10-CM | POA: Diagnosis not present

## 2019-09-16 DIAGNOSIS — M25561 Pain in right knee: Secondary | ICD-10-CM

## 2019-09-16 NOTE — Progress Notes (Signed)
Office Visit Note   Patient: Eric Fisher.           Date of Birth: 1966-03-10           MRN: [id number] Visit Date: 09/16/2019              Requested by: Marton Redwood, MD [address] [place],  [place] [address] PCP: Marton Redwood, MD   Assessment & Plan: Visit Diagnoses:  1. Chronic pain of right knee   2. Chronic pain of left knee     Plan: We talked in detail about his knees.  I do feel the most important thing for him will be quad strengthening exercises.  I demonstrated how I need him to do these 2 strengthen his quads which will help support his knees especially with squatting and going up and down stairs.  Since he is not having significant pain I would not recommend steroid injections.  This is more of knees that would benefit the most from activity modification and quad strengthening.  He says that make sense to him.  We had a long and thorough discussion about this.  All question concerns were answered and addressed.  Follow-up can be as needed.  Follow-Up Instructions: Return if symptoms worsen or fail to improve.   Orders:  Orders Placed This Encounter  Procedures  . XR Knee 1-2 Views Right  . XR Knee 1-2 Views Left   No orders of the defined types were placed in this encounter.     Procedures: No procedures performed   Clinical Data: No additional findings.   Subjective: Chief Complaint  Patient presents with  . Left Knee - Pain  . Right Knee - Pain  The patient is a very pleasant 53 year old gentleman who comes in for evaluation treatment of bilateral knee pain.  He says is more of a stiffness in his knees.  There is pain that is worse in the morning.  There is pain when he goes downstairs as well.  Squatting causes pain in both knees.  He says is not a daily problem and he does not affect his  activities daily living detrimentally.  He had a left knee arthroscopy about 10 to 12 years ago.  He is on Eliquis for history of blood clots.  He did have a recent DVT about 4 months ago in his left leg.  He is not overweight.  He denies any locking catching in his knees or symptoms of instability.  HPI  Review of Systems He currently denies any headache, chest pain, shortness of breath, fever, chills, nausea, vomiting  Objective: Vital Signs: There were no vitals taken for this visit.  Physical Exam He is alert and orient x3 and in no acute distress Ortho Exam Examination of both knees showed no malalignment.  Both knees have patellofemoral crepitation with patellas that tracks slightly laterally.  Both knees are ligamentously stable with full range of motion.  Neither knee has an effusion. Specialty Comments:  No specialty comments available.  Imaging: Xr Knee 1-2 Views Left  Result Date: 09/16/2019 2 views left knee show no acute findings.  The medial lateral compartments are well-maintained.  There is moderate patellofemoral arthritic changes.  Xr Knee 1-2 Views Right  Result Date: 09/16/2019 2 views of the right knee show no acute findings.  The medial lateral compartments are well-maintained.  There is patellofemoral arthritic changes.    PMFS History: Patient Active Problem List   Diagnosis Date Noted  . Essential hypertension 09/14/2018  . 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 Hyperlipidemia 04/04/2015  . Acute coronary syndrome ([place]) 05/19/2012  . Gallstones 07/10/2011  . Gallbladder polyp 07/10/2011  . Epigastric pain 05/29/2011  . CAD (coronary artery disease) 04/05/2011  . Chronic anticoagulation 04/05/2011  . Embolism and thrombosis of unspecified site 03/21/2011  . HEMORRHOIDS, INTERNAL 02/27/2010  . GERD 02/27/2010   Past Medical History:  Diagnosis Date  . Anxiety   . Complication of anesthesia   . Coronary artery disease    ectatic coronaries per  cath in August 2013 - managed with Brilinta and aspirin  . Gallbladder polyp 05/2011   on abd u/s  . Gallstones 05/2011   on abd u/s  . GERD (gastroesophageal reflux disease)   . Hemorrhoid    s/p surgery August 2013  . Hypertension    he denies  . MI, acute, non ST segment elevation ([place]) 03/2011   thrombus of LAD  . PONV (postoperative nausea and vomiting)     Family History  Problem Relation Age of Onset  . Arrhythmia Father        a. flutter  . GER disease Father   . Heart Problems Father        nonischemic cardiomyopathy, ASCVD  . Colon cancer Neg Hx     Past Surgical History:  Procedure Laterality Date  . BRAVO [place] STUDY  02/2008   Day 1, 155 episodes of acid reflux with longest episode 29 minutes, [place] 41.7. Day 2, 51 episodes, [place] 12.4. Two of three episodes of chest pain correlated with episode of acid reflux. Study done OFF PPI.  Marland Kitchen CARDIAC CATHETERIZATION  03/11/2011   This demonstrated marked ectasia of the proximal LAD with suggestion of a large filling defect consistent  with thrombus.  There was clot extending into the first septal   perforating branch which had slow flow.   Marland Kitchen CARDIAC CATHETERIZATION  05/19/2012   Angiographic data- large and slightly ectatic left main, no stenosis or thrombi, pLAD extremely large and ectatic, no thombi, remained LAD normal; normal LCx without stenosis or ectasia; minor luminal RCA irregularities, dRCA mildly ectatic; PDA and PLSA normal size.   . CHOLECYSTECTOMY N/A 04/17/2013   Procedure: LAPAROSCOPIC CHOLECYSTECTOMY;  Surgeon: Jamesetta So, MD;  Location: [facility];  Service: General;  Laterality: N/A;  . COLONOSCOPY N/A 05/15/2019   Procedure: COLONOSCOPY;  Surgeon: Daneil Dolin, MD;  Location: [facility];  Service: Endoscopy;  Laterality: N/A;  2:15pm  . ESOPHAGOGASTRODUODENOSCOPY  02/2008   small hh  . ESOPHAGOGASTRODUODENOSCOPY  06/19/2011   Dr. Vivi Ferns esophagus, small hiatal hernia o/w normal stomach  . HEMORRHOID  SURGERY  05/23/2012   Procedure: HEMORRHOIDECTOMY;  Surgeon: Jamesetta So, MD;  Location: [facility];  Service: General;  Laterality: N/A;  . HEMORROIDECTOMY    . KNEE ARTHROSCOPY    . LEFT HEART CATHETERIZATION WITH CORONARY ANGIOGRAM N/A 05/19/2012   Procedure: LEFT HEART CATHETERIZATION WITH CORONARY ANGIOGRAM;  Surgeon: Thayer Headings, MD;  Location[facility] LAB;  Service: Cardiovascular;  Laterality: N/A;  .  POLYPECTOMY  05/15/2019   Procedure: POLYPECTOMY;  Surgeon: Daneil Dolin, MD;  Location: [facility];  Service: Endoscopy;;  splenic flex and hepatic flex  . TRANSTHORACIC ECHOCARDIOGRAM  03/13/2011   Left ventricle: Mild distal septal hypokinesis The cavity size was normal. Systolic function was normal. The estimated ejection fraction was 55%. Wall motion was normal   Social History   Occupational History  . Occupation: maintenance    Employer: [employer]  Tobacco Use  . Smoking status: Never Smoker  . Smokeless tobacco: Never Used  Substance and Sexual Activity  . Alcohol use: No  . Drug use: No  . Sexual activity: Not Currently

## 2019-09-23 DIAGNOSIS — Z1212 Encounter for screening for malignant neoplasm of rectum: Secondary | ICD-10-CM | POA: Diagnosis not present

## 2020-02-05 ENCOUNTER — Other Ambulatory Visit: Payer: Self-pay | Admitting: Cardiovascular Disease

## 2020-03-01 DIAGNOSIS — M542 Cervicalgia: Secondary | ICD-10-CM | POA: Diagnosis not present

## 2020-03-15 ENCOUNTER — Ambulatory Visit: Payer: BC Managed Care – PPO | Admitting: Cardiovascular Disease

## 2020-03-16 ENCOUNTER — Encounter: Payer: Self-pay | Admitting: Cardiovascular Disease

## 2020-03-16 ENCOUNTER — Other Ambulatory Visit: Payer: Self-pay

## 2020-03-16 ENCOUNTER — Ambulatory Visit: Payer: BC Managed Care – PPO | Admitting: Cardiovascular Disease

## 2020-03-16 VITALS — BP 106/78 | HR 70 | Ht 71.0 in | Wt 218.5 lb

## 2020-03-16 DIAGNOSIS — I2510 Atherosclerotic heart disease of native coronary artery without angina pectoris: Secondary | ICD-10-CM | POA: Diagnosis not present

## 2020-03-16 DIAGNOSIS — Z7901 Long term (current) use of anticoagulants: Secondary | ICD-10-CM

## 2020-03-16 DIAGNOSIS — E785 Hyperlipidemia, unspecified: Secondary | ICD-10-CM

## 2020-03-16 LAB — BASIC METABOLIC PANEL
BUN/Creatinine Ratio: 11 (ref 9–20)
BUN: 9 mg/dL (ref 6–24)
CO2: 25 mmol/L (ref 20–29)
Calcium: 9.2 mg/dL (ref 8.7–10.2)
Chloride: 101 mmol/L (ref 96–106)
Creatinine, Ser: 0.84 mg/dL (ref 0.76–1.27)
GFR calc Af Amer: 117 mL/min/{1.73_m2} (ref >59)
GFR calc non Af Amer: 101 mL/min/{1.73_m2} (ref >59)
Glucose: 101 mg/dL — ABNORMAL HIGH (ref 65–99)
Potassium: 4.4 mmol/L (ref 3.5–5.2)
Sodium: 140 mmol/L (ref 134–144)

## 2020-03-16 LAB — LIPID PANEL
Chol/HDL Ratio: 2.9 ratio (ref 0.0–5.0)
Cholesterol, Total: 113 mg/dL (ref 100–199)
HDL: 39 mg/dL — ABNORMAL LOW (ref >39)
LDL Chol Calc (NIH): 58 mg/dL (ref 0–99)
Triglycerides: 77 mg/dL (ref 0–149)
VLDL Cholesterol Cal: 16 mg/dL (ref 5–40)

## 2020-03-16 LAB — HEPATIC FUNCTION PANEL
ALT: 29 IU/L (ref 0–44)
AST: 23 IU/L (ref 0–40)
Albumin: 4.6 g/dL (ref 3.8–4.9)
Alkaline Phosphatase: 82 IU/L (ref 48–121)
Bilirubin Total: 0.7 mg/dL (ref 0.0–1.2)
Bilirubin, Direct: 0.22 mg/dL (ref 0.00–0.40)
Total Protein: 7.2 g/dL (ref 6.0–8.5)

## 2020-03-16 NOTE — Patient Instructions (Signed)
Medication Instructions:  Your physician recommends that you continue on your current medications as directed. Please refer to the Current Medication list given to you today.  *If you need a refill on your cardiac medications before your next appointment, please call your pharmacy*   Lab Work: BMET, Liver and Lipid today  If you have labs (blood work) drawn today and your tests are completely normal, you will receive your results only by:  [place] (if you have MyChart) OR  A paper copy in the mail If you have any lab test that is abnormal or we need to change your treatment, we will call you to review the results.   Testing/Procedures: None   Follow-Up: At [facility], you and your health needs are our priority.  As part of our continuing mission to provide you with exceptional heart care, we have created designated Provider Care Teams.  These Care Teams include your primary Cardiologist (physician) and Advanced Practice Providers (APPs -  Physician Assistants and Nurse Practitioners) who all work together to provide you with the care you need, when you need it.  We recommend signing up for the patient portal called "MyChart".  Sign up information is provided on this After Visit Summary.  MyChart is used to connect with patients for Virtual Visits (Telemedicine).  Patients are able to view lab/test results, encounter notes, upcoming appointments, etc.  Non-urgent messages can be sent to your provider as well.   To learn more about what you can do with MyChart, go to [email or web address].    Your next appointment:   12 month(s)  The format for your next appointment:   In Person  Provider:   You may see Mertie Moores, MD or one of the following Advanced Practice Providers on your designated Care Team:    Richardson Dopp, PA-C  Robbie Lis, [place]    Other Instructions

## 2020-03-16 NOTE — Progress Notes (Signed)
Cardiology Office Note   Date:  03/16/2020   ID:  [name]., DOB Aug 27, 1966, MRN [id number]  PCP:  Marton Redwood, MD  Cardiologist:   Mertie Moores, MD   Chief Complaint  Patient presents with  . Coronary Artery Disease   1. 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 Hyperlipidemia   History of Present Illness: Eric Fisher is a 54 year old gentleman with a history of coronary artery disease. 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 We treated with Brilinta, aspirin, and Coumadin. He's had some atypical episodes of chest pain since last saw him. None of his symptoms were similar to his presenting episodes of angina. He exercises on a daily basis and does not have any episodes of chest pain.  He has been maintained on Brilinta and Coumadin he seems to be doing fairly well. He is quite active and has not had any recurrent episodes of angina.  Dec. 9, 2013 When I last saw him several months ago we discontinued his Coumadin. We have maintained the Brilinta and ASA 81 QD. He has been able to all of his normal activities without any chest pain.  March 16, 2013:  He denies any chest pain. He is getting some regular exercise.  March 18, 2014:  Eric Fisher is doing very well. He's not had any episodes of chest pain or shortness breath. He has remained very active. He's been very busy at work. He's looking forward to getting back into an exercise regimen this summer.   April 04, 2015:   Eric Sperling Eero Dini. is a 54 y.o. male who presents for follow up of his CAD Doing well.  No CP .    April 03, 2016:  Eric Fisher is doing well.   Has had dx of CAD for 5 years.   Kawasaki's disease  Exercising some .   Putting in long hours at work .    Aug. 23, 2018  Eric Fisher is doing well. He has a history of coronary artery disease-Kawasaki's disease. He has been maintained on low-dose Brilinta and is doing quite  well. 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   September night, 2019: Eric Fisher is seen today for follow-up of his coronary artery disease ( Kawasaki's disease) and DVT.  He was found to have a DVT on January 24, 2018: The Brilinta was stopped and he was started on Eliquis 5 mg twice a day.  It sounds like he had clotting factors measured by Dr. Brigitte Pulse.  I do not have the results but it sounds like he did have an abnormality which would cause him to be hypercoagulable.  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  Blood Pressure is  elevated today.   March 16, 2020:  Eric Fisher is seen today for follow-up visit of his coronary artery disease and hypercoagulable state.  He also has a history of hypertension and hyperlipidemia.  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  He has a history of a DVT.  He is now on Eliquis 5 mg twice a day. Diastolic BP is a little elevated .   Recheck bp is fine   Past Medical History:  Diagnosis Date  . Anxiety   . Complication of anesthesia   . Coronary artery disease    ectatic coronaries per cath in August 2013 - managed with Brilinta and aspirin  . Gallbladder  polyp 05/2011   on abd u/s  . Gallstones 05/2011   on abd u/s  . GERD (gastroesophageal reflux disease)   . Hemorrhoid    s/p surgery August 2013  . Hypertension    he denies  . MI, acute, non ST segment elevation ([place]) 03/2011   thrombus of LAD  . PONV (postoperative nausea and vomiting)     Past Surgical History:  Procedure Laterality Date  . BRAVO [place] STUDY  02/2008   Day 1, 155 episodes of acid reflux with longest episode 29 minutes, [place]ter 41.7. Day 2, 51 episodes, [place] 12.4. Two of three episodes of chest pain correlated with episode of acid reflux. Study done OFF PPI.  Marland Kitchen CARDIAC CATHETERIZATION  03/11/2011   This demonstrated marked ectasia of the proximal LAD with suggestion of a large filling defect  consistent  with thrombus.  There was clot extending into the first septal   perforating branch which had slow flow.   Marland Kitchen CARDIAC CATHETERIZATION  05/19/2012   Angiographic data- large and slightly ectatic left main, no stenosis or thrombi, pLAD extremely large and ectatic, no thombi, remained LAD normal; normal LCx without stenosis or ectasia; minor luminal RCA irregularities, dRCA mildly ectatic; PDA and PLSA normal size.   . CHOLECYSTECTOMY N/A 04/17/2013   Procedure: LAPAROSCOPIC CHOLECYSTECTOMY;  Surgeon: Jamesetta So, MD;  Location: [facility];  Service: General;  Laterality: N/A;  . COLONOSCOPY N/A 05/15/2019   Procedure: COLONOSCOPY;  Surgeon: Daneil Dolin, MD;  Location[facility];  Service: Endoscopy;  Laterality: N/A;  2:15pm  . ESOPHAGOGASTRODUODENOSCOPY  02/2008   small hh  . ESOPHAGOGASTRODUODENOSCOPY  06/19/2011   Dr. Vivi Ferns esophagus, small hiatal hernia o/w normal stomach  . HEMORRHOID SURGERY  05/23/2012   Procedure: HEMORRHOIDECTOMY;  Surgeon: Jamesetta So, MD;  Location: [facility];  Service: General;  Laterality: N/A;  . HEMORROIDECTOMY    . KNEE ARTHROSCOPY    . LEFT HEART CATHETERIZATION WITH CORONARY ANGIOGRAM N/A 05/19/2012   Procedure: LEFT HEART CATHETERIZATION WITH CORONARY ANGIOGRAM;  Surgeon: Thayer Headings, MD;  Location[facility] C[facility] LAB;  Service: Cardiovascular;  Laterality: N/A;  . POLYPECTOMY  05/15/2019   Procedure: POLYPECTOMY;  Surgeon: Daneil Dolin, MD;  [facility];  Service: Endoscopy;;  splenic flex and hepatic flex  . TRANSTHORACIC ECHOCARDIOGRAM  03/13/2011   Left ventricle: Mild distal septal hypokinesis The cavity size was normal. Systolic function was normal. The estimated ejection fraction was 55%. Wall motion was normal     Current Outpatient Medications  Medication Sig Dispense Refill  . acetaminophen (TYLENOL) 500 MG tablet Take 1,000 mg by mouth daily as needed for moderate pain.     Marland Kitchen ALPRAZolam (XANAX) 0.5 MG tablet Take 0.5 mg  by mouth 2 (two) times daily as needed for anxiety.     Marland Kitchen amitriptyline (ELAVIL) 10 MG tablet Take 1 tablet (10 mg total) by mouth at bedtime. 30 tablet 1  . apixaban (ELIQUIS) 5 MG TABS tablet Take 5 mg by mouth 2 (two) times daily.    Marland Kitchen aspirin 81 MG tablet Take 81 mg by mouth every evening.     Marland Kitchen atorvastatin (LIPITOR) 40 MG tablet TAKE 1 TABLET BY MOUTH  DAILY 90 tablet 3  . fluticasone (FLONASE) 50 MCG/ACT nasal spray Place 1 spray into both nostrils daily.    . hydrochlorothiazide (HYDRODIURIL) 25 MG tablet TAKE 1 TABLET BY MOUTH  DAILY 90 tablet 3  . metoprolol tartrate (LOPRESSOR) 25 MG tablet  TAKE 1 TABLET BY MOUTH 2  TIMES DAILY 180 tablet 0  . Multiple Vitamin (MULTIVITAMIN WITH MINERALS) TABS Take 1 tablet by mouth 2 (two) times daily.     . nitroGLYCERIN (NITROSTAT) 0.4 MG SL tablet Place 1 tablet (0.4 mg total) under the tongue every 5 (five) minutes x 3 doses as needed. Chest pain 25 tablet 3  . potassium chloride (K-DUR) 10 MEQ tablet TAKE 1 TABLET BY MOUTH  DAILY 90 tablet 3  . RABEprazole (ACIPHEX) 20 MG tablet TAKE 1 TABLET BY MOUTH  DAILY 90 tablet 3   No current facility-administered medications for this visit.    Allergies:   Patient has no known allergies.    Social History:  The patient  reports that he has never smoked. He has never used smokeless tobacco. He reports that he does not drink alcohol or use drugs.   Family History:  The patient's family history includes Arrhythmia in his father; GER disease in his father; Heart Problems in his father.    ROS:   Noted in current history, otherwise review of systems is negative.    Physical Exam: Blood pressure 106/78, pulse 70, height 5\' 11"  (1.803 m), weight 218 lb 8 oz (99.1 kg), SpO2 96 %.  GEN:  Well nourished, well developed in no acute distress HEENT: Normal NECK: No JVD; No carotid bruits LYMPHATICS: No lymphadenopathy CARDIAC: RRR  RESPIRATORY:  Clear to auscultation without rales, wheezing or rhonchi    ABDOMEN: Soft, non-tender, non-distended MUSCULOSKELETAL:  No edema; No deformity  SKIN: Warm and dry NEUROLOGIC:  Alert and oriented x 3    EKG:   March 16, 2020: Normal sinus rhythm at 71.  No ST or T wave abnormalities.   Recent Labs: No results found for requested labs within last 8760 hours.    Lipid Panel    Component Value Date/Time   CHOL 108 06/16/2018 0924   CHOL 90 03/15/2014 0743   TRIG 77 06/16/2018 0924   TRIG 86 03/15/2014 0743   HDL 45 06/16/2018 0924   HDL 37 (L) 03/15/2014 0743   CHOLHDL 2.4 06/16/2018 0924   CHOLHDL 2.4 04/03/2016 0812   VLDL 14 04/03/2016 0812   LDLCALC 48 06/16/2018 0924   LDLCALC 36 03/15/2014 0743      Wt Readings from Last 3 Encounters:  03/16/20 218 lb 8 oz (99.1 kg)  05/15/19 210 lb (95.3 kg)  03/31/19 212 lb 6.4 oz (96.3 kg)      Other studies Reviewed: Additional studies/ records that were reviewed today include: . Review of the above records demonstrates:    ASSESSMENT AND PLAN:  1. Coronary artery disease-  .  No angiana .  Cont ASA and eliquis for now.  No signs of GI bleeding .  He has markedly dilated coronary arteries similar to Kawasaki disease.  Fortunately not had any episodes of angina.    2.  Possible hypercoagulable state:  .  He had a DVT.  Continue Eliquis.  I suspect that he will need Eliquis lifelong.  2. Hyperlipidemia  -     continue atorvastatin.  Will check labs today.        Current medicines are reviewed at length with the patient today.  The patient does not have concerns regarding medicines.  The following changes have been made:  no change  Labs/ tests ordered today include:  No orders of the defined types were placed in this encounter.    Disposition:   FU with me  in 1 year      Mertie Moores, MD  03/16/2020 9:07 AM    [facility] [place], [place], [place]  [address] Phone: [phone]; Fax: [phone]

## 2020-03-26 ENCOUNTER — Other Ambulatory Visit: Payer: Self-pay | Admitting: Cardiovascular Disease

## 2020-04-26 ENCOUNTER — Other Ambulatory Visit: Payer: Self-pay | Admitting: Cardiovascular Disease

## 2020-05-01 ENCOUNTER — Other Ambulatory Visit: Payer: Self-pay | Admitting: Gastroenterology

## 2020-05-04 ENCOUNTER — Other Ambulatory Visit: Payer: Self-pay | Admitting: Nurse Practitioner

## 2020-05-16 DIAGNOSIS — R630 Anorexia: Secondary | ICD-10-CM | POA: Diagnosis not present

## 2020-05-16 DIAGNOSIS — Z20828 Contact with and (suspected) exposure to other viral communicable diseases: Secondary | ICD-10-CM | POA: Diagnosis not present

## 2020-05-16 DIAGNOSIS — Z1152 Encounter for screening for COVID-19: Secondary | ICD-10-CM | POA: Diagnosis not present

## 2020-07-16 DIAGNOSIS — Z23 Encounter for immunization: Secondary | ICD-10-CM | POA: Diagnosis not present

## 2020-08-11 DIAGNOSIS — Z23 Encounter for immunization: Secondary | ICD-10-CM | POA: Diagnosis not present

## 2020-09-02 DIAGNOSIS — R7301 Impaired fasting glucose: Secondary | ICD-10-CM | POA: Diagnosis not present

## 2020-09-02 DIAGNOSIS — E785 Hyperlipidemia, unspecified: Secondary | ICD-10-CM | POA: Diagnosis not present

## 2020-09-02 DIAGNOSIS — Z125 Encounter for screening for malignant neoplasm of prostate: Secondary | ICD-10-CM | POA: Diagnosis not present

## 2020-09-08 DIAGNOSIS — Z1389 Encounter for screening for other disorder: Secondary | ICD-10-CM | POA: Diagnosis not present

## 2020-09-08 DIAGNOSIS — I10 Essential (primary) hypertension: Secondary | ICD-10-CM | POA: Diagnosis not present

## 2020-09-08 DIAGNOSIS — E785 Hyperlipidemia, unspecified: Secondary | ICD-10-CM | POA: Diagnosis not present

## 2020-09-08 DIAGNOSIS — Z0000 Encounter for general adult medical examination without abnormal findings: Secondary | ICD-10-CM | POA: Diagnosis not present

## 2020-09-08 DIAGNOSIS — Z1331 Encounter for screening for depression: Secondary | ICD-10-CM | POA: Diagnosis not present

## 2020-09-08 DIAGNOSIS — Z23 Encounter for immunization: Secondary | ICD-10-CM | POA: Diagnosis not present

## 2021-03-01 ENCOUNTER — Other Ambulatory Visit: Payer: Self-pay | Admitting: Cardiovascular Disease

## 2021-03-23 ENCOUNTER — Encounter: Payer: Self-pay | Admitting: Cardiovascular Disease

## 2021-03-23 NOTE — Progress Notes (Signed)
Cardiology Office Note   Date:  03/24/2021   ID:  Eric Setting., DOB December 06, 1965, MRN [id number]  PCP:  Ginger Organ., MD  Cardiologist:   Mertie Moores, MD   Chief Complaint  Patient presents with   Coronary Artery Disease   1. 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 Hyperlipidemia   History of Present Illness: Eric Fisher is a 55 year old gentleman with a history of coronary artery disease. 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 We treated with Brilinta, aspirin, and Coumadin. He's had some atypical episodes of chest pain since last saw him. None of his symptoms were similar to his presenting episodes of angina. He exercises on a daily basis and does not have any episodes of chest pain.  He has been maintained on Brilinta and Coumadin he seems to be doing fairly well. He is quite active and has not had any recurrent episodes of angina.  Dec. 9, 2013 When I last saw him several months ago we discontinued his Coumadin. We have maintained the Brilinta and ASA 81 QD. He has been able to all of his normal activities without any chest pain.  March 16, 2013:  He denies any chest pain.  He is getting some regular exercise.  March 18, 2014:  Eric Fisher is doing very well. He's not had any episodes of chest pain or shortness breath. He has remained very active.  He's been very busy at work. He's looking forward to getting back into an exercise regimen this summer.   April 04, 2015:   Eric Keisling Sorren Vallier. is a 55 y.o. male who presents for follow up of his CAD Doing well.  No CP .    April 03, 2016:  Eric Fisher is doing well.   Has had dx of CAD for 5 years.   Kawasaki's disease  Exercising some .   Putting in long hours at work .    Aug. 23, 2018  Eric Fisher is doing well. He has a history of coronary artery disease-Kawasaki's disease. He has been maintained on low-dose Brilinta and is doing  quite well. 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   September night, 2019: Eric Fisher is seen today for follow-up of his coronary artery disease ( Kawasaki's disease) and DVT.  He was found to have a DVT on January 24, 2018: The Brilinta was stopped and he was started on Eliquis 5 mg twice a day.  It sounds like he had clotting factors measured by Dr. Brigitte Pulse.  I do not have the results but it sounds like he did have an abnormality which would cause him to be hypercoagulable.  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  Blood Pressure is  elevated today.   March 16, 2020:  Eric Fisher is seen today for follow-up visit of his coronary artery disease and hypercoagulable state.  He also has a history of hypertension and hyperlipidemia.  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  He has a history of a DVT.  He is now on Eliquis 5 mg twice a day. Diastolic BP is a little elevated .   Recheck bp is fine   March 24, 2021: Eric Fisher is seen today for follow up of his CAD and hypercoaguable state. Hx of DVT  Doing well from a cardiac standpoint.   Past Medical History:  Diagnosis Date  Anxiety    Complication of anesthesia    Coronary artery disease    ectatic coronaries per cath in August 2013 - managed with Brilinta and aspirin   Gallbladder polyp 05/2011   on abd u/s   Gallstones 05/2011   on abd u/s   GERD (gastroesophageal reflux disease)    Hemorrhoid    s/p surgery August 2013   Hypertension    he denies   MI, acute, non ST segment elevation ([place]) 03/2011   thrombus of LAD   PONV (postoperative nausea and vomiting)     Past Surgical History:  Procedure Laterality Date   BRAVO [place] STUDY  02/2008   Day 1, 155 episodes of acid reflux with longest episode 29 minutes, De[place]ter 41.7. Day 2, 51 episodes, [place]ter 12.4. Two of three episodes of chest pain correlated with episode of acid reflux. Study done OFF PPI.    CARDIAC CATHETERIZATION  03/11/2011   This demonstrated marked ectasia of the proximal LAD with suggestion of a large filling defect consistent  with thrombus.  There was clot extending into the first septal   perforating branch which had slow flow.    CARDIAC CATHETERIZATION  05/19/2012   Angiographic data- large and slightly ectatic left main, no stenosis or thrombi, pLAD extremely large and ectatic, no thombi, remained LAD normal; normal LCx without stenosis or ectasia; minor luminal RCA irregularities, dRCA mildly ectatic; PDA and PLSA normal size.    CHOLECYSTECTOMY N/A 04/17/2013   Procedure: LAPAROSCOPIC CHOLECYSTECTOMY;  Surgeon: Jamesetta So, MD;  Location: [facility];  Service: General;  Laterality: N/A;   COLONOSCOPY N/A 05/15/2019   Procedure: COLONOSCOPY;  Surgeon: Daneil Dolin, MD;  [facility];  Service: Endoscopy;  Laterality: N/A;  2:15pm   ESOPHAGOGASTRODUODENOSCOPY  02/2008   small hh   ESOPHAGOGASTRODUODENOSCOPY  06/19/2011   Dr. Vivi Ferns esophagus, small hiatal hernia o/w normal stomach   HEMORRHOID SURGERY  05/23/2012   Procedure: HEMORRHOIDECTOMY;  Surgeon: Jamesetta So, MD;  Location: [facility];  Service: General;  Laterality: N/A;   HEMORROIDECTOMY     KNEE ARTHROSCOPY     LEFT HEART CATHETERIZATION WITH CORONARY ANGIOGRAM N/A 05/19/2012   Procedure: LEFT HEART CATHETERIZATION WITH CORONARY ANGIOGRAM;  Surgeon: Thayer Headings, MD;  Location: [facility] CATH LAB;  Service: Cardiovascular;  Laterality: N/A;   POLYPECTOMY  05/15/2019   Procedure: POLYPECTOMY;  Surgeon: Daneil Dolin, MD;  [facility];  Service: Endoscopy;;  splenic flex and hepatic flex   TRANSTHORACIC ECHOCARDIOGRAM  03/13/2011   Left ventricle: Mild distal septal hypokinesis The cavity size was normal. Systolic function was normal. The estimated ejection fraction was 55%. Wall motion was normal     Current Outpatient Medications  Medication Sig Dispense Refill   acetaminophen (TYLENOL)  500 MG tablet Take 1,000 mg by mouth daily as needed for moderate pain.      ALPRAZolam (XANAX) 0.5 MG tablet Take 0.5 mg by mouth 2 (two) times daily as needed for anxiety.      amitriptyline (ELAVIL) 10 MG tablet TAKE 1 TABLET BY MOUTH AT  BEDTIME 90 tablet 3   apixaban (ELIQUIS) 5 MG TABS tablet Take 5 mg by mouth 2 (two) times daily.     aspirin 81 MG tablet Take 81 mg by mouth every evening.      atorvastatin (LIPITOR) 40 MG tablet TAKE 1 TABLET BY MOUTH  DAILY 90 tablet 3   fluticasone (FLONASE) 50 MCG/ACT nasal spray Place 1 spray into  both nostrils daily.     hydrochlorothiazide (HYDRODIURIL) 25 MG tablet TAKE 1 TABLET BY MOUTH  DAILY 90 tablet 3   metoprolol tartrate (LOPRESSOR) 25 MG tablet TAKE 1 TABLET BY MOUTH  TWICE DAILY 180 tablet 3   Multiple Vitamin (MULTIVITAMIN WITH MINERALS) TABS Take 1 tablet by mouth 2 (two) times daily.      nitroGLYCERIN (NITROSTAT) 0.4 MG SL tablet Place 1 tablet (0.4 mg total) under the tongue every 5 (five) minutes x 3 doses as needed. Chest pain 25 tablet 3   potassium chloride (KLOR-CON) 10 MEQ tablet TAKE 1 TABLET BY MOUTH  DAILY 90 tablet 3   RABEprazole (ACIPHEX) 20 MG tablet TAKE 1 TABLET BY MOUTH  DAILY 90 tablet 3   No current facility-administered medications for this visit.    Allergies:   Patient has no known allergies.    Social History:  The patient  reports that he has never smoked. He has never used smokeless tobacco. He reports that he does not drink alcohol and does not use drugs.   Family History:  The patient's family history includes Arrhythmia in his father; GER disease in his father; Heart Problems in his father.    ROS:   Noted in current history, otherwise review of systems is negative.   Physical Exam: Blood pressure 106/76, pulse 71, height 5\' 11"  (1.803 m), weight 210 lb (95.3 kg), SpO2 98 %.  GEN:  Well nourished, well developed in no acute distress HEENT: Normal NECK: No JVD; No carotid bruits LYMPHATICS: No  lymphadenopathy CARDIAC: RRR , no murmurs, rubs, gallops RESPIRATORY:  Clear to auscultation without rales, wheezing or rhonchi  ABDOMEN: Soft, non-tender, non-distended MUSCULOSKELETAL:  No edema; No deformity  SKIN: Warm and dry NEUROLOGIC:  Alert and oriented x 3     EKG:  March 24 2021  NSR 71.  No ST or T wave changes .   Recent Labs: No results found for requested labs within last 8760 hours.    Lipid Panel    Component Value Date/Time   CHOL 113 03/16/2020 0919   CHOL 90 03/15/2014 0743   TRIG 77 03/16/2020 0919   TRIG 86 03/15/2014 0743   HDL 39 (L) 03/16/2020 0919   HDL 37 (L) 03/15/2014 0743   CHOLHDL 2.9 03/16/2020 0919   CHOLHDL 2.4 04/03/2016 0812   VLDL 14 04/03/2016 0812   LDLCALC 58 03/16/2020 0919   LDLCALC 36 03/15/2014 0743      Wt Readings from Last 3 Encounters:  03/24/21 210 lb (95.3 kg)  03/16/20 218 lb 8 oz (99.1 kg)  05/15/19 210 lb (95.3 kg)      Other studies Reviewed: Additional studies/ records that were reviewed today include: . Review of the above records demonstrates:    ASSESSMENT AND PLAN:  1. Coronary artery disease-  .   No angina .   Cont ASA , atorvastatin     2.   hypercoagulable state:  .   Hx of DVT. Cont Eliquis   2. Hyperlipidemia  -      check lipids , BMP today   4.  HTN:   cont meds.  BP is well controlled.         Current medicines are reviewed at length with the patient today.  The patient does not have concerns regarding medicines.  The following changes have been made:  no change  Labs/ tests ordered today include:   Orders Placed This Encounter  Procedures   Basic metabolic  panel   Lipid panel   EKG 12-Lead      Disposition:   FU with me in 1 year      Mertie Moores, MD  03/24/2021 8:18 AM    [facility] [place], [place], [place]  [address] Phone: [phone]; Fax: [phone]

## 2021-03-24 ENCOUNTER — Ambulatory Visit: Payer: BC Managed Care – PPO | Admitting: Cardiovascular Disease

## 2021-03-24 ENCOUNTER — Encounter: Payer: Self-pay | Admitting: Cardiovascular Disease

## 2021-03-24 ENCOUNTER — Other Ambulatory Visit: Payer: Self-pay

## 2021-03-24 VITALS — BP 106/76 | HR 71 | Ht 71.0 in | Wt 210.0 lb

## 2021-03-24 DIAGNOSIS — I2510 Atherosclerotic heart disease of native coronary artery without angina pectoris: Secondary | ICD-10-CM | POA: Diagnosis not present

## 2021-03-24 LAB — BASIC METABOLIC PANEL
BUN/Creatinine Ratio: 11 (ref 9–20)
BUN: 10 mg/dL (ref 6–24)
CO2: 24 mmol/L (ref 20–29)
Calcium: 9.5 mg/dL (ref 8.7–10.2)
Chloride: 100 mmol/L (ref 96–106)
Creatinine, Ser: 0.87 mg/dL (ref 0.76–1.27)
Glucose: 103 mg/dL — ABNORMAL HIGH (ref 65–99)
Potassium: 4.2 mmol/L (ref 3.5–5.2)
Sodium: 139 mmol/L (ref 134–144)
eGFR: 104 mL/min/{1.73_m2} (ref >59)

## 2021-03-24 LAB — LIPID PANEL
Chol/HDL Ratio: 2.6 ratio (ref 0.0–5.0)
Cholesterol, Total: 116 mg/dL (ref 100–199)
HDL: 45 mg/dL (ref >39)
LDL Chol Calc (NIH): 56 mg/dL (ref 0–99)
Triglycerides: 72 mg/dL (ref 0–149)
VLDL Cholesterol Cal: 15 mg/dL (ref 5–40)

## 2021-03-24 NOTE — Patient Instructions (Signed)
Medication Instructions:  Your physician recommends that you continue on your current medications as directed. Please refer to the Current Medication list given to you today.  *If you need a refill on your cardiac medications before your next appointment, please call your pharmacy*   Lab Work: TODAY: Lipids, BMP If you have labs (blood work) drawn today and your tests are completely normal, you will receive your results only by: [place] (if you have MyChart) OR A paper copy in the mail If you have any lab test that is abnormal or we need to change your treatment, we will call you to review the results.   Testing/Procedures: none   Follow-Up: At [facility], you and your health needs are our priority.  As part of our continuing mission to provide you with exceptional heart care, we have created designated Provider Care Teams.  These Care Teams include your primary Cardiologist (physician) and Advanced Practice Providers (APPs -  Physician Assistants and Nurse Practitioners) who all work together to provide you with the care you need, when you need it.  We recommend signing up for the patient portal called "MyChart".  Sign up information is provided on this After Visit Summary.  MyChart is used to connect with patients for Virtual Visits (Telemedicine).  Patients are able to view lab/test results, encounter notes, upcoming appointments, etc.  Non-urgent messages can be sent to your provider as well.   To learn more about what you can do with MyChart, go to [email or web address].    Your next appointment:   1 year(s)  The format for your next appointment:   In Person  Provider:   Mertie Moores, MD

## 2021-03-29 ENCOUNTER — Other Ambulatory Visit: Payer: Self-pay | Admitting: Cardiovascular Disease

## 2021-04-06 ENCOUNTER — Other Ambulatory Visit: Payer: Self-pay | Admitting: Gastroenterology

## 2021-04-07 NOTE — Telephone Encounter (Signed)
Last seen 05/2019. Needs ov for refills.

## 2021-04-11 ENCOUNTER — Encounter: Payer: Self-pay | Admitting: Internal Medicine

## 2021-07-06 DIAGNOSIS — H25013 Cortical age-related cataract, bilateral: Secondary | ICD-10-CM | POA: Diagnosis not present

## 2021-07-06 DIAGNOSIS — H40013 Open angle with borderline findings, low risk, bilateral: Secondary | ICD-10-CM | POA: Diagnosis not present

## 2021-07-06 DIAGNOSIS — H35371 Puckering of macula, right eye: Secondary | ICD-10-CM | POA: Diagnosis not present

## 2021-07-06 DIAGNOSIS — H2513 Age-related nuclear cataract, bilateral: Secondary | ICD-10-CM | POA: Diagnosis not present

## 2021-07-10 DIAGNOSIS — M25562 Pain in left knee: Secondary | ICD-10-CM | POA: Diagnosis not present

## 2021-07-10 DIAGNOSIS — M1712 Unilateral primary osteoarthritis, left knee: Secondary | ICD-10-CM | POA: Diagnosis not present

## 2021-07-10 DIAGNOSIS — S838X2A Sprain of other specified parts of left knee, initial encounter: Secondary | ICD-10-CM | POA: Diagnosis not present

## 2021-07-10 DIAGNOSIS — Z86718 Personal history of other venous thrombosis and embolism: Secondary | ICD-10-CM | POA: Diagnosis not present

## 2021-07-20 DIAGNOSIS — M25562 Pain in left knee: Secondary | ICD-10-CM | POA: Diagnosis not present

## 2021-07-20 DIAGNOSIS — Z23 Encounter for immunization: Secondary | ICD-10-CM | POA: Diagnosis not present

## 2021-09-21 ENCOUNTER — Other Ambulatory Visit: Payer: Self-pay | Admitting: Gastroenterology

## 2021-09-25 NOTE — Telephone Encounter (Signed)
Spoke to pt. Informed him he needs and OV. Transferred to front office.

## 2021-09-25 NOTE — Telephone Encounter (Signed)
Needs office visit for additional refills as previously recommended in June.

## 2021-09-26 ENCOUNTER — Other Ambulatory Visit: Payer: Self-pay

## 2021-09-26 ENCOUNTER — Encounter: Payer: Self-pay | Admitting: Internal Medicine

## 2021-09-26 ENCOUNTER — Ambulatory Visit: Payer: BC Managed Care – PPO | Admitting: Internal Medicine

## 2021-09-26 VITALS — BP 122/86 | HR 76 | Temp 96.6°F | Ht 71.0 in | Wt 219.0 lb

## 2021-09-26 DIAGNOSIS — Z86010 Personal history of colonic polyps: Secondary | ICD-10-CM | POA: Diagnosis not present

## 2021-09-26 DIAGNOSIS — K219 Gastro-esophageal reflux disease without esophagitis: Secondary | ICD-10-CM

## 2021-09-26 MED ORDER — RABEPRAZOLE SODIUM 20 MG PO TBEC: 20.0000 mg | DELAYED_RELEASE_TABLET | Freq: Every day | ORAL | 3 refills | Status: DC

## 2021-09-26 NOTE — Progress Notes (Signed)
Primary Care Physician:  Ginger Organ., MD Primary Gastroenterologist:  Dr. Gala Romney  Pre-Procedure History & Physical: HPI:  Eric Fitz. is a 55 y.o. male here for follow-up of GERD.  Doing well on Aciphex 20 mg daily; if he misses a day or 2 he has recurrent symptoms.  No dysphagia.  Small hiatal hernia only on EGD 10 years ago.  History of multiple adenomas removed his colon 2020; due for surveillance examination 2025.  He is having no dysphagia melena or rectal bleeding.  Denies abdominal pain clinically, doing well.  Gallbladder out secondary to polyps.  CAD stable he is followed yearly by the cardiologist.  Past Medical History:  Diagnosis Date   Anxiety    Complication of anesthesia    Coronary artery disease    ectatic coronaries per cath in August 2013 - managed with Brilinta and aspirin   Gallbladder polyp 05/2011   on abd u/s   Gallstones 05/2011   on abd u/s   GERD (gastroesophageal reflux disease)    Hemorrhoid    s/p surgery August 2013   Hypertension    he denies   MI, acute, non ST segment elevation ([place]) 03/2011   thrombus of LAD   PONV (postoperative nausea and vomiting)     Past Surgical History:  Procedure Laterality Date   BRAVO [place] STUDY  02/2008   Day 1, 155 episodes of acid reflux with longest episode 29 minutes, De[place]ter 41.7. Day 2, 51 episodes, [place] 12.4. Two of three episodes of chest pain correlated with episode of acid reflux. Study done OFF PPI.   CARDIAC CATHETERIZATION  03/11/2011   This demonstrated marked ectasia of the proximal LAD with suggestion of a large filling defect consistent  with thrombus.  There was clot extending into the first septal   perforating branch which had slow flow.    CARDIAC CATHETERIZATION  05/19/2012   Angiographic data- large and slightly ectatic left main, no stenosis or thrombi, pLAD extremely large and ectatic, no thombi, remained LAD normal; normal LCx without stenosis or ectasia; minor luminal RCA  irregularities, dRCA mildly ectatic; PDA and PLSA normal size.    CHOLECYSTECTOMY N/A 04/17/2013   Procedure: LAPAROSCOPIC CHOLECYSTECTOMY;  Surgeon: Jamesetta So, MD;  Location: [facility];  Service: General;  Laterality: N/A;   COLONOSCOPY N/A 05/15/2019   Procedure: COLONOSCOPY;  Surgeon: Daneil Dolin, MD;  [facility];  Service: Endoscopy;  Laterality: N/A;  2:15pm   ESOPHAGOGASTRODUODENOSCOPY  02/2008   small hh   ESOPHAGOGASTRODUODENOSCOPY  06/19/2011   Dr. Vivi Ferns esophagus, small hiatal hernia o/w normal stomach   HEMORRHOID SURGERY  05/23/2012   Procedure: HEMORRHOIDECTOMY;  Surgeon: Jamesetta So, MD;  Location: [facility];  Service: General;  Laterality: N/A;   HEMORROIDECTOMY     KNEE ARTHROSCOPY     LEFT HEART CATHETERIZATION WITH CORONARY ANGIOGRAM N/A 05/19/2012   Procedure: LEFT HEART CATHETERIZATION WITH CORONARY ANGIOGRAM;  Surgeon: Thayer Headings, MD;  Location: [facility] CATH LAB;  Service: Cardiovascular;  Laterality: N/A;   POLYPECTOMY  05/15/2019   Procedure: POLYPECTOMY;  Surgeon: Daneil Dolin, MD;  [facility];  Service: Endoscopy;;  splenic flex and hepatic flex   TRANSTHORACIC ECHOCARDIOGRAM  03/13/2011   Left ventricle: Mild distal septal hypokinesis The cavity size was normal. Systolic function was normal. The estimated ejection fraction was 55%. Wall motion was normal    Prior to Admission medications   Medication Sig Start Date End  Date Taking? Authorizing Provider  acetaminophen (TYLENOL) 500 MG tablet Take 1,000 mg by mouth daily as needed for moderate pain.    Yes [provider]  ALPRAZolam Duanne Moron) 0.5 MG tablet Take 0.5 mg by mouth 2 (two) times daily as needed for anxiety.  05/03/11  Yes [provider]  amitriptyline (ELAVIL) 10 MG tablet TAKE 1 TABLET BY MOUTH AT  BEDTIME 04/07/21  Yes Mahala Menghini, PA-C  apixaban (ELIQUIS) 5 MG TABS tablet Take 5 mg by mouth 2 (two) times daily.   Yes [provider]  aspirin  81 MG tablet Take 81 mg by mouth every evening.    Yes [provider]  atorvastatin (LIPITOR) 40 MG tablet TAKE 1 TABLET BY MOUTH  DAILY 03/02/21  Yes Nahser, Wonda Cheng, MD  fluticasone (FLONASE) 50 MCG/ACT nasal spray Place 1 spray into both nostrils daily.   Yes [provider]  hydrochlorothiazide (HYDRODIURIL) 25 MG tablet TAKE 1 TABLET BY MOUTH  DAILY 03/02/21  Yes Nahser, Wonda Cheng, MD  metoprolol tartrate (LOPRESSOR) 25 MG tablet TAKE 1 TABLET BY MOUTH  TWICE DAILY 03/30/21  Yes Nahser, Wonda Cheng, MD  Multiple Vitamin (MULTIVITAMIN WITH MINERALS) TABS Take 1 tablet by mouth 2 (two) times daily.    Yes [provider]  nitroGLYCERIN (NITROSTAT) 0.4 MG SL tablet Place 1 tablet (0.4 mg total) under the tongue every 5 (five) minutes x 3 doses as needed. Chest pain 10/17/17  Yes Nahser, Wonda Cheng, MD  potassium chloride (KLOR-CON) 10 MEQ tablet TAKE 1 TABLET BY MOUTH  DAILY 03/02/21  Yes Nahser, Wonda Cheng, MD  RABEprazole (ACIPHEX) 20 MG tablet TAKE 1 TABLET BY MOUTH  DAILY 04/07/21  Yes Mahala Menghini, PA-C    Allergies as of 09/26/2021   (No Known Allergies)    Family History  Problem Relation Age of Onset   Arrhythmia Father        a. flutter   GER disease Father    Heart Problems Father        nonischemic cardiomyopathy, ASCVD   Colon cancer Neg Hx     Social History   Socioeconomic History   Marital status: Single    Spouse name: Not on file   Number of children: 0   Years of education: Not on file   Highest education level: Not on file  Occupational History   Occupation: maintenance    Employer: [employer]  Tobacco Use   Smoking status: Never   Smokeless tobacco: Never  Vaping Use   Vaping Use: Never used  Substance and Sexual Activity   Alcohol use: No   Drug use: No   Sexual activity: Not Currently  Other Topics Concern   Not on file  Social History Narrative   Not on file   Social Determinants of Health   Financial Resource Strain: Not on  file  Food Insecurity: Not on file  Transportation Needs: Not on file  Physical Activity: Not on file  Stress: Not on file  Social Connections: Not on file  Intimate Partner Violence: Not on file    Review of Systems: See HPI, otherwise negative ROS  Physical Exam: BP 122/86    Pulse 76    Temp (!) 96.6 F (35.9 C) (Temporal)    Ht 5\' 11"  (1.803 m)    Wt 219 lb (99.3 kg)    BMI 30.54 kg/m  General:   Alert,  Well-developed, well-nourished, pleasant and cooperative in NAD Abdomen: Non-distended, normal bowel  sounds.  Soft and nontender without appreciable mass or hepatosplenomegaly.  Pulses:  Normal pulses noted. Extremities:  Without clubbing or edema.  Impression/Plan: Uncomplicated GERD in this pleasant 55 year old gentleman doing very well on Aciphex 20 mg daily.  Risk versus benefits of long-term acid suppression therapy reviewed.  No reason to ponder other avenues of therapy including antireflux surgery at this time.  No Barrett's epithelium in 2012.  History of colonic adenoma; due for surveillance examination 2025.  Recommendations: Continue Aciphex 20 mg daily-as discussed the benefits outweigh the risks (Dispense 90 with 3 refills)  GERD information provided  History of colonic polyps; recommend repeat colonoscopy in 2025  Unless something comes up, I will see back in the office in 2 years.       Notice: This dictation was prepared with Dragon dictation along with smaller phrase technology. Any transcriptional errors that result from this process are unintentional and may not be corrected upon review.

## 2021-09-26 NOTE — Patient Instructions (Signed)
It was good to see you again today!  Continue Aciphex 20 mg daily-as discussed the benefits outweigh the risks (Dispense 90 with 3 refills)  GERD information provided  History of colonic polyps; recommend repeat colonoscopy in 2025  Unless something comes up, I will see back in the office in 2 years.

## 2021-10-05 NOTE — Telephone Encounter (Signed)
Noted  

## 2021-10-11 DIAGNOSIS — R7301 Impaired fasting glucose: Secondary | ICD-10-CM | POA: Diagnosis not present

## 2021-10-11 DIAGNOSIS — Z125 Encounter for screening for malignant neoplasm of prostate: Secondary | ICD-10-CM | POA: Diagnosis not present

## 2021-10-11 DIAGNOSIS — E785 Hyperlipidemia, unspecified: Secondary | ICD-10-CM | POA: Diagnosis not present

## 2021-10-18 DIAGNOSIS — Z1331 Encounter for screening for depression: Secondary | ICD-10-CM | POA: Diagnosis not present

## 2021-10-18 DIAGNOSIS — Z1339 Encounter for screening examination for other mental health and behavioral disorders: Secondary | ICD-10-CM | POA: Diagnosis not present

## 2021-10-18 DIAGNOSIS — I10 Essential (primary) hypertension: Secondary | ICD-10-CM | POA: Diagnosis not present

## 2021-10-18 DIAGNOSIS — R82998 Other abnormal findings in urine: Secondary | ICD-10-CM | POA: Diagnosis not present

## 2021-10-18 DIAGNOSIS — Z0000 Encounter for general adult medical examination without abnormal findings: Secondary | ICD-10-CM | POA: Diagnosis not present

## 2021-11-06 NOTE — Telephone Encounter (Signed)
Pt needs OV for additional refills

## 2021-11-08 MED ORDER — AMITRIPTYLINE HCL 10 MG PO TABS: 10.0000 mg | ORAL_TABLET | Freq: Every day | ORAL | 3 refills | Status: DC

## 2022-02-02 ENCOUNTER — Other Ambulatory Visit: Payer: Self-pay | Admitting: Cardiovascular Disease

## 2022-03-08 ENCOUNTER — Other Ambulatory Visit: Payer: Self-pay | Admitting: Cardiovascular Disease

## 2022-03-10 ENCOUNTER — Other Ambulatory Visit: Payer: Self-pay | Admitting: Cardiovascular Disease

## 2022-04-06 ENCOUNTER — Other Ambulatory Visit: Payer: Self-pay | Admitting: Cardiovascular Disease

## 2022-04-15 ENCOUNTER — Encounter: Payer: Self-pay | Admitting: Cardiovascular Disease

## 2022-04-15 NOTE — Progress Notes (Unsigned)
Cardiology Office Note   Date:  04/15/2022   ID:  [name]., DOB Jun 29, 1966, MRN [id number]  PCP:  Ginger Organ., MD  Cardiologist:   Mertie Moores, MD   Chief Complaint  Patient presents with   Coronary Artery Disease   1. 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 Hyperlipidemia   History of Present Illness: Eric Fisher is a 56 year old gentleman with a history of coronary artery disease. 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 We treated with Brilinta, aspirin, and Coumadin. He's had some atypical episodes of chest pain since last saw him. None of his symptoms were similar to his presenting episodes of angina. He exercises on a daily basis and does not have any episodes of chest pain.  He has been maintained on Brilinta and Coumadin he seems to be doing fairly well. He is quite active and has not had any recurrent episodes of angina.  Dec. 9, 2013 When I last saw him several months ago we discontinued his Coumadin. We have maintained the Brilinta and ASA 81 QD. He has been able to all of his normal activities without any chest pain.  March 16, 2013:  He denies any chest pain.  He is getting some regular exercise.  March 18, 2014:  Eric Fisher is doing very well. He's not had any episodes of chest pain or shortness breath. He has remained very active.  He's been very busy at work. He's looking forward to getting back into an exercise regimen this summer.   April 04, 2015:   Eric Fisher. is a 56 y.o. male who presents for follow up of his CAD Doing well.  No CP .    April 03, 2016:  Eric Fisher is doing well.   Has had dx of CAD for 5 years.   Kawasaki's disease  Exercising some .   Putting in long hours at work .    Aug. 23, 2018  Eric Fisher is doing well. He has a history of coronary artery disease-Kawasaki's disease. He has been maintained on low-dose Brilinta and is doing  quite well. 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   September night, 2019: Eric Fisher is seen today for follow-up of his coronary artery disease ( Kawasaki's disease) and DVT.  He was found to have a DVT on January 24, 2018: The Brilinta was stopped and he was started on Eliquis 5 mg twice a day.  It sounds like he had clotting factors measured by Dr. Brigitte Pulse.  I do not have the results but it sounds like he did have an abnormality which would cause him to be hypercoagulable.  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  Blood Pressure is  elevated today.   March 16, 2020:  Eric Fisher is seen today for follow-up visit of his coronary artery disease and hypercoagulable state.  He also has a history of hypertension and hyperlipidemia.  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  He has a history of a DVT.  He is now on Eliquis 5 mg twice a day. Diastolic BP is a little elevated .   Recheck bp is fine   March 24, 2021: Eric Fisher is seen today for follow up of his CAD and hypercoaguable state. Hx of DVT  Doing well from a cardiac standpoint.    April 16, 2022 Eric Fisher is seen today  for follow up of his CAD, hypercoagulable state. Hx of DVT   Past Medical History:  Diagnosis Date   Anxiety    Complication of anesthesia    Coronary artery disease    ectatic coronaries per cath in August 2013 - managed with Brilinta and aspirin   Gallbladder polyp 05/2011   on abd u/s   Gallstones 05/2011   on abd u/s   GERD (gastroesophageal reflux disease)    Hemorrhoid    s/p surgery August 2013   Hypertension    he denies   MI, acute, non ST segment elevation ([place]) 03/2011   thrombus of LAD   PONV (postoperative nausea and vomiting)     Past Surgical History:  Procedure Laterality Date   BRAVO [place] STUDY  02/2008   Day 1, 155 episodes of acid reflux with longest episode 29 minutes, [place]ter 41.7. Day 2, 51 episodes,  [place] 12.4. Two of three episodes of chest pain correlated with episode of acid reflux. Study done OFF PPI.   CARDIAC CATHETERIZATION  03/11/2011   This demonstrated marked ectasia of the proximal LAD with suggestion of a large filling defect consistent  with thrombus.  There was clot extending into the first septal   perforating branch which had slow flow.    CARDIAC CATHETERIZATION  05/19/2012   Angiographic data- large and slightly ectatic left main, no stenosis or thrombi, pLAD extremely large and ectatic, no thombi, remained LAD normal; normal LCx without stenosis or ectasia; minor luminal RCA irregularities, dRCA mildly ectatic; PDA and PLSA normal size.    CHOLECYSTECTOMY N/A 04/17/2013   Procedure: LAPAROSCOPIC CHOLECYSTECTOMY;  Surgeon: Jamesetta So, MD;  Location: [facility];  Service: General;  Laterality: N/A;   COLONOSCOPY N/A 05/15/2019   Procedure: COLONOSCOPY;  Surgeon: Daneil Dolin, MD;  [facility];  Service: Endoscopy;  Laterality: N/A;  2:15pm   ESOPHAGOGASTRODUODENOSCOPY  02/2008   small hh   ESOPHAGOGASTRODUODENOSCOPY  06/19/2011   Dr. Vivi Ferns esophagus, small hiatal hernia o/w normal stomach   HEMORRHOID SURGERY  05/23/2012   Procedure: HEMORRHOIDECTOMY;  Surgeon: Jamesetta So, MD;  Location: [facility];  Service: General;  Laterality: N/A;   HEMORROIDECTOMY     KNEE ARTHROSCOPY     LEFT HEART CATHETERIZATION WITH CORONARY ANGIOGRAM N/A 05/19/2012   Procedure: LEFT HEART CATHETERIZATION WITH CORONARY ANGIOGRAM;  Surgeon: Thayer Headings, MD;  Location: [facility] CATH LAB;  Service: Cardiovascular;  Laterality: N/A;   POLYPECTOMY  05/15/2019   Procedure: POLYPECTOMY;  Surgeon: Daneil Dolin, MD;  [facility];  Service: Endoscopy;;  splenic flex and hepatic flex   TRANSTHORACIC ECHOCARDIOGRAM  03/13/2011   Left ventricle: Mild distal septal hypokinesis The cavity size was normal. Systolic function was normal. The estimated ejection fraction was 55%. Wall motion  was normal     Current Outpatient Medications  Medication Sig Dispense Refill   acetaminophen (TYLENOL) 500 MG tablet Take 1,000 mg by mouth daily as needed for moderate pain.      ALPRAZolam (XANAX) 0.5 MG tablet Take 0.5 mg by mouth 2 (two) times daily as needed for anxiety.      amitriptyline (ELAVIL) 10 MG tablet Take 1 tablet (10 mg total) by mouth at bedtime. 90 tablet 3   apixaban (ELIQUIS) 5 MG TABS tablet Take 5 mg by mouth 2 (two) times daily.     aspirin 81 MG tablet Take 81 mg by mouth every evening.      atorvastatin (LIPITOR) 40 MG  tablet TAKE 1 TABLET BY MOUTH  DAILY 90 tablet 0   fluticasone (FLONASE) 50 MCG/ACT nasal spray Place 1 spray into both nostrils daily.     hydrochlorothiazide (HYDRODIURIL) 25 MG tablet TAKE 1 TABLET BY MOUTH  DAILY 90 tablet 0   metoprolol tartrate (LOPRESSOR) 25 MG tablet TAKE 1 TABLET BY MOUTH TWICE  DAILY 30 tablet 0   Multiple Vitamin (MULTIVITAMIN WITH MINERALS) TABS Take 1 tablet by mouth 2 (two) times daily.      nitroGLYCERIN (NITROSTAT) 0.4 MG SL tablet Place 1 tablet (0.4 mg total) under the tongue every 5 (five) minutes x 3 doses as needed. Chest pain 25 tablet 3   potassium chloride (KLOR-CON M) 10 MEQ tablet TAKE 1 TABLET BY MOUTH  DAILY 90 tablet 0   RABEprazole (ACIPHEX) 20 MG tablet TAKE 1 TABLET BY MOUTH  DAILY 90 tablet 3   RABEprazole (ACIPHEX) 20 MG tablet Take 1 tablet (20 mg total) by mouth daily. 90 tablet 3   No current facility-administered medications for this visit.    Allergies:   Patient has no known allergies.    Social History:  The patient  reports that he has never smoked. He has never used smokeless tobacco. He reports that he does not drink alcohol and does not use drugs.   Family History:  The patient's family history includes Arrhythmia in his father; GER disease in his father; Heart Problems in his father.    ROS:   Noted in current history, otherwise review of systems is negative.   Physical  Exam: There were no vitals taken for this visit.  GEN:  Well nourished, well developed in no acute distress HEENT: Normal NECK: No JVD; No carotid bruits LYMPHATICS: No lymphadenopathy CARDIAC: RRR ***, no murmurs, rubs, gallops RESPIRATORY:  Clear to auscultation without rales, wheezing or rhonchi  ABDOMEN: Soft, non-tender, non-distended MUSCULOSKELETAL:  No edema; No deformity  SKIN: Warm and dry NEUROLOGIC:  Alert and oriented x 3      EKG:      Recent Labs: No results found for requested labs within last 365 days.    Lipid Panel    Component Value Date/Time   CHOL 116 03/24/2021 0820   CHOL 90 03/15/2014 0743   TRIG 72 03/24/2021 0820   TRIG 86 03/15/2014 0743   HDL 45 03/24/2021 0820   HDL 37 (L) 03/15/2014 0743   CHOLHDL 2.6 03/24/2021 0820   CHOLHDL 2.4 04/03/2016 0812   VLDL 14 04/03/2016 0812   LDLCALC 56 03/24/2021 0820   LDLCALC 36 03/15/2014 0743      Wt Readings from Last 3 Encounters:  09/26/21 219 lb (99.3 kg)  03/24/21 210 lb (95.3 kg)  03/16/20 218 lb 8 oz (99.1 kg)      Other studies Reviewed: Additional studies/ records that were reviewed today include: . Review of the above records demonstrates:    ASSESSMENT AND PLAN:  1. Coronary artery disease-  .        2.   hypercoagulable state:  .     2. Hyperlipidemia  -        4.  HTN:            Current medicines are reviewed at length with the patient today.  The patient does not have concerns regarding medicines.  The following changes have been made:  no change  Labs/ tests ordered today include:   No orders of the defined types were placed in this encounter.  Disposition:   FU with me in 1 year      Mertie Moores, MD  04/15/2022 7:24 AM    [facility] [address], [place], [place]  [address] Phone: [phone]; Fax: [phone]

## 2022-04-16 ENCOUNTER — Ambulatory Visit: Payer: BC Managed Care – PPO | Admitting: Cardiovascular Disease

## 2022-04-16 ENCOUNTER — Encounter: Payer: Self-pay | Admitting: Cardiovascular Disease

## 2022-04-16 VITALS — BP 112/78 | HR 78 | Ht 71.0 in | Wt 213.6 lb

## 2022-04-16 DIAGNOSIS — D6859 Other primary thrombophilia: Secondary | ICD-10-CM

## 2022-04-16 DIAGNOSIS — I2510 Atherosclerotic heart disease of native coronary artery without angina pectoris: Secondary | ICD-10-CM | POA: Diagnosis not present

## 2022-04-16 LAB — BASIC METABOLIC PANEL
BUN/Creatinine Ratio: 9 (ref 9–20)
BUN: 7 mg/dL (ref 6–24)
CO2: 24 mmol/L (ref 20–29)
Calcium: 9.3 mg/dL (ref 8.7–10.2)
Chloride: 100 mmol/L (ref 96–106)
Creatinine, Ser: 0.82 mg/dL (ref 0.76–1.27)
Glucose: 95 mg/dL (ref 70–99)
Potassium: 3.8 mmol/L (ref 3.5–5.2)
Sodium: 138 mmol/L (ref 134–144)
eGFR: 105 mL/min/{1.73_m2} (ref >59)

## 2022-04-16 LAB — LIPID PANEL
Chol/HDL Ratio: 2.4 ratio (ref 0.0–5.0)
Cholesterol, Total: 106 mg/dL (ref 100–199)
HDL: 44 mg/dL (ref >39)
LDL Chol Calc (NIH): 48 mg/dL (ref 0–99)
Triglycerides: 66 mg/dL (ref 0–149)
VLDL Cholesterol Cal: 14 mg/dL (ref 5–40)

## 2022-04-16 LAB — ALT: ALT: 35 IU/L (ref 0–44)

## 2022-04-16 NOTE — Patient Instructions (Signed)
Medication Instructions:  Your physician recommends that you continue on your current medications as directed. Please refer to the Current Medication list given to you today.  *If you need a refill on your cardiac medications before your next appointment, please call your pharmacy*   Lab Work: LIPIDS, ALT, BMET Today If you have labs (blood work) drawn today and your tests are completely normal, you will receive your results only by: MyChart Message (if you have MyChart) OR A paper copy in the mail If you have any lab test that is abnormal or we need to change your treatment, we will call you to review the results.   Testing/Procedures: NONE   Follow-Up: At [facility], you and your health needs are our priority.  As part of our continuing mission to provide you with exceptional heart care, we have created designated Provider Care Teams.  These Care Teams include your primary Cardiologist (physician) and Advanced Practice Providers (APPs -  Physician Assistants and Nurse Practitioners) who all work together to provide you with the care you need, when you need it.  Your next appointment:   1 year(s)  The format for your next appointment:   In Person  Provider:   Swinyer, Weaver, or Nahser {    Important Information About Sugar       

## 2022-04-23 ENCOUNTER — Other Ambulatory Visit: Payer: Self-pay | Admitting: Cardiovascular Disease

## 2022-04-24 ENCOUNTER — Other Ambulatory Visit: Payer: Self-pay | Admitting: Cardiovascular Disease

## 2022-06-18 ENCOUNTER — Ambulatory Visit
Admission: EM | Admit: 2022-06-18 | Discharge: 2022-06-18 | Disposition: A | Discharge status: Home / Self Care | Payer: BC Managed Care – PPO | Attending: Nurse Practitioner | Admitting: Nurse Practitioner

## 2022-06-18 DIAGNOSIS — B349 Viral infection, unspecified: Secondary | ICD-10-CM | POA: Diagnosis not present

## 2022-06-18 DIAGNOSIS — Z20822 Contact with and (suspected) exposure to covid-19: Secondary | ICD-10-CM | POA: Insufficient documentation

## 2022-06-18 LAB — RESP PANEL BY RT-PCR (FLU A&B, COVID) ARPGX2
Influenza A by PCR: NEGATIVE
Influenza B by PCR: NEGATIVE
SARS Coronavirus 2 by RT PCR: NEGATIVE

## 2022-06-18 NOTE — ED Provider Notes (Signed)
[facility]    CSN: [id number] Arrival date & time: 06/18/22  0950      History   Chief Complaint No chief complaint on file.   HPI Eric Fisher. is a 56 y.o. male.   The history is provided by the patient.   Patient presents with a 3-day history of chills, hot flashes, headache, generalized body aches, and fatigue.  He also endorses a "mild" headache.  Patient denies nasal congestion, runny nose, sore throat, ear pain, cough, wheezing, shortness of breath, or GI symptoms.  Patient denies any known sick contacts.  Patient states over the weekend, he did a lot of napping.  He denies any known sick contacts.  States that he has received 4 COVID vaccines to date.  He has been taking Tylenol for his symptoms.  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 Day 2, 51 episodes, [place]  12.4. Two of three episodes of chest pain correlated with episode of acid reflux. Study done OFF PPI.   CARDIAC CATHETERIZATION  03/11/2011   This demonstrated marked ectasia of the proximal LAD with suggestion of a large filling defect consistent  with thrombus.  There was clot extending into the first septal   perforating branch which had slow flow.    CARDIAC CATHETERIZATION  05/19/2012   Angiographic data- large and slightly ectatic left main, no stenosis or thrombi, pLAD extremely large and ectatic, no thombi, remained LAD normal; normal LCx without stenosis or ectasia; minor luminal RCA irregularities, dRCA mildly ectatic; PDA and PLSA normal size.    CHOLECYSTECTOMY N/A 04/17/2013   Procedure: LAPAROSCOPIC CHOLECYSTECTOMY;  Surgeon: Jamesetta So, MD;  Location: [facility];  Service: General;  Laterality: N/A;   COLONOSCOPY N/A 05/15/2019   Procedure: COLONOSCOPY;  Surgeon: Daneil Dolin, MD;  Location: [facility];  Service: Endoscopy;  Laterality: N/A;  2:15pm   ESOPHAGOGASTRODUODENOSCOPY  02/2008   small hh   ESOPHAGOGASTRODUODENOSCOPY  06/19/2011   Dr. Vivi Ferns esophagus, small hiatal hernia o/w normal stomach   HEMORRHOID SURGERY  05/23/2012   Procedure: HEMORRHOIDECTOMY;  Surgeon: Jamesetta So, MD;  Location: [facility];  Service: General;  Laterality: N/A;   HEMORROIDECTOMY     KNEE ARTHROSCOPY     LEFT HEART CATHETERIZATION WITH CORONARY ANGIOGRAM N/A  05/19/2012   Procedure: LEFT HEART CATHETERIZATION WITH CORONARY ANGIOGRAM;  Surgeon: Thayer Headings, MD;  Location: [facility] CATH LAB;  Service: Cardiovascular;  Laterality: N/A;   POLYPECTOMY  05/15/2019   Procedure: POLYPECTOMY;  Surgeon: Daneil Dolin, MD;  Location: [facility];  Service: Endoscopy;;  splenic flex and hepatic flex   TRANSTHORACIC ECHOCARDIOGRAM  03/13/2011   Left ventricle: Mild distal septal hypokinesis The cavity size was normal. Systolic function was normal. The estimated ejection fraction was 55%. Wall motion was  normal       Home Medications    Prior to Admission medications   Medication Sig Start Date End Date Taking? Authorizing Provider  acetaminophen (TYLENOL) 500 MG tablet Take 1,000 mg by mouth daily as needed for moderate pain.     [provider]  ALPRAZolam Duanne Moron) 0.5 MG tablet Take 0.5 mg by mouth 2 (two) times daily as needed for anxiety.  05/03/11   [provider]  amitriptyline (ELAVIL) 10 MG tablet Take 1 tablet (10 mg total) by mouth at bedtime. 11/08/21   Annitta Needs, NP  apixaban (ELIQUIS) 5 MG TABS tablet Take 5 mg by mouth 2 (two) times daily.    [provider]  aspirin 81 MG tablet Take 81 mg by mouth every evening.     [provider]  atorvastatin (LIPITOR) 40 MG tablet TAKE 1 TABLET BY MOUTH DAILY 04/24/22   Nahser, Wonda Cheng, MD  fluticasone (FLONASE) 50 MCG/ACT nasal spray Place 1 spray into both nostrils daily.    [provider]  hydrochlorothiazide (HYDRODIURIL) 25 MG tablet TAKE 1 TABLET BY MOUTH DAILY 04/24/22   Nahser, Wonda Cheng, MD  metoprolol tartrate (LOPRESSOR) 25 MG tablet TAKE 1 TABLET BY MOUTH TWICE  DAILY 04/09/22   Nahser, Wonda Cheng, MD  Multiple Vitamin (MULTIVITAMIN WITH MINERALS) TABS Take 1 tablet by mouth 2 (two) times daily.     [provider]  nitroGLYCERIN (NITROSTAT) 0.4 MG SL tablet Place 1 tablet (0.4 mg total) under the tongue every 5 (five) minutes x 3 doses as needed. Chest pain 10/17/17   Nahser, Wonda Cheng, MD  potassium chloride (KLOR-CON M) 10 MEQ tablet TAKE 1 TABLET BY MOUTH DAILY 04/24/22   Nahser, Wonda Cheng, MD  RABEprazole (ACIPHEX) 20 MG tablet TAKE 1 TABLET BY MOUTH  DAILY 04/07/21   Mahala Menghini, PA-C    Family History Family History  Problem Relation Age of Onset   Arrhythmia Father        a. flutter   GER disease Father    Heart Problems Father        nonischemic cardiomyopathy, ASCVD   Colon cancer Neg Hx     Social History Social History   Tobacco Use   Smoking status:  Never   Smokeless tobacco: Never  Vaping Use   Vaping Use: Never used  Substance Use Topics   Alcohol use: No   Drug use: No     Allergies   Patient has no known allergies.   Review of Systems Review of Systems Per HPI  Physical Exam Triage Vital Signs ED Triage Vitals  Enc Vitals Group     BP 06/18/22 1226 119/78     Pulse Rate 06/18/22 1226 77     Resp 06/18/22 1226 18     Temp 06/18/22 1226 97.7 F (36.5 C)     Temp Source 06/18/22 1226 Oral     SpO2 06/18/22 1226 95 %  Weight --      Height --      Head Circumference --      Peak Flow --      Pain Score 06/18/22 1222 3     Pain Loc --      Pain Edu? --      Excl. in [place]? --    No data found.  Updated Vital Signs BP 119/78 (BP Location: Right Arm)   Pulse 77   Temp 97.7 F (36.5 C) (Oral)   Resp 18   SpO2 95%   Visual Acuity Right Eye Distance:   Left Eye Distance:   Bilateral Distance:    Right Eye Near:   Left Eye Near:    Bilateral Near:     Physical Exam Vitals and nursing note reviewed.  Constitutional:      General: He is not in acute distress.    Appearance: Normal appearance. He is well-developed.  HENT:     Head: Normocephalic and atraumatic.     Right Ear: Tympanic membrane, ear canal and external ear normal.     Left Ear: Tympanic membrane, ear canal and external ear normal.     Nose: Nose normal.     Right Turbinates: Enlarged and swollen.     Left Turbinates: Enlarged and swollen.     Right Sinus: No maxillary sinus tenderness or frontal sinus tenderness.     Left Sinus: No maxillary sinus tenderness or frontal sinus tenderness.     Mouth/Throat:     Lips: Pink.     Mouth: Mucous membranes are moist.     Pharynx: Oropharynx is clear. Uvula midline.     Tonsils: No tonsillar exudate.  Eyes:     Extraocular Movements: Extraocular movements intact.     Conjunctiva/sclera: Conjunctivae normal.     Pupils: Pupils are equal, round, and reactive to light.  Cardiovascular:      Rate and Rhythm: Normal rate and regular rhythm.     Pulses: Normal pulses.     Heart sounds: Normal heart sounds. No murmur heard. Pulmonary:     Effort: Pulmonary effort is normal. No respiratory distress.     Breath sounds: Normal breath sounds. No stridor. No wheezing, rhonchi or rales.  Abdominal:     General: Bowel sounds are normal.     Palpations: Abdomen is soft.     Tenderness: There is no abdominal tenderness.  Musculoskeletal:        General: No swelling.     Cervical back: Normal range of motion.  Lymphadenopathy:     Cervical: No cervical adenopathy.  Skin:    General: Skin is warm and dry.     Capillary Refill: Capillary refill takes less than 2 seconds.  Neurological:     General: No focal deficit present.     Mental Status: He is alert and oriented to person, place, and time.  Psychiatric:        Mood and Affect: Mood normal.        Behavior: Behavior normal.      UC Treatments / Results  Labs (all labs ordered are listed, but only abnormal results are displayed) Labs Reviewed  RESP PANEL BY RT-PCR (FLU A&B, COVID) [id number]    EKG   Radiology No results found.  Procedures Procedures (including critical care time)  Medications Ordered in UC Medications - No data to display  Initial Impression / Assessment and Plan / UC Course  I have reviewed the triage vital signs and  the nursing notes.  Pertinent labs & imaging results that were available during my care of the patient were reviewed by me and considered in my medical decision making (see chart for details).  Patient presents for flulike symptoms that been present for the past 3 days.  On exam, patient's vital signs are stable, he is in no acute distress.  He is afebrile at this time.  Based on the patient's presentation, symptoms are consistent with a viral illness.  COVID/flu test performed through rule out.  Patient was advised that he will be contacted if his COVID test is positive, recent lab  results reviewed from 04/16/2022, creatinine is 0.82.  Patient was advised to continue use of Tylenol for pain or discomfort.  Supportive care recommendations were also provided to the patient to include increasing fluids and allow for plenty of rest.  Work note was provided to the patient until COVID results are received.  Patient was given strict indications of when to go to the emergency department.  Patient verbalizes understanding.  All questions were answered. Final Clinical Impressions(s) / UC Diagnoses   Final diagnoses:  Viral illness  Encounter for screening laboratory testing for COVID-19 virus     Discharge Instructions      Your COVID/flu test is pending.  As discussed, you will be contacted if the results of the test are positive.  If the test results are positive for COVID, you are a candidate to receive Paxlovid. Increase fluids and allow for plenty of rest. Continue over-the-counter Tylenol as needed for pain, fever, or general discomfort. Go to the emergency department if you develop worsening symptoms to include shortness of breath, difficulty breathing, trouble breathing, or other concerns. Follow-up as needed.     ED Prescriptions   None    PDMP not reviewed this encounter.   Tish Men, NP 06/18/22 1250

## 2022-06-18 NOTE — ED Triage Notes (Signed)
Pt reports chills, hot flashes, headache, body aches and fatigue x 3 days. Tylenol gives some relief with headache.

## 2022-06-18 NOTE — Discharge Instructions (Addendum)
Your COVID/flu test is pending.  As discussed, you will be contacted if the results of the test are positive.  If the test results are positive for COVID, you are a candidate to receive Paxlovid. Increase fluids and allow for plenty of rest. Continue over-the-counter Tylenol as needed for pain, fever, or general discomfort. Go to the emergency department if you develop worsening symptoms to include shortness of breath, difficulty breathing, trouble breathing, or other concerns. Follow-up as needed.

## 2022-06-22 ENCOUNTER — Encounter (HOSPITAL_COMMUNITY): Payer: Self-pay | Admitting: Emergency Medicine

## 2022-06-22 ENCOUNTER — Emergency Department (HOSPITAL_COMMUNITY): Payer: BC Managed Care – PPO

## 2022-06-22 ENCOUNTER — Emergency Department (HOSPITAL_COMMUNITY)
Admission: EM | Admit: 2022-06-22 | Discharge: 2022-06-23 | Disposition: A | Discharge status: Home / Self Care | Payer: BC Managed Care – PPO | Attending: Emergency Medicine | Admitting: Emergency Medicine

## 2022-06-22 DIAGNOSIS — I10 Essential (primary) hypertension: Secondary | ICD-10-CM | POA: Diagnosis not present

## 2022-06-22 DIAGNOSIS — Z7982 Long term (current) use of aspirin: Secondary | ICD-10-CM | POA: Insufficient documentation

## 2022-06-22 DIAGNOSIS — Z7901 Long term (current) use of anticoagulants: Secondary | ICD-10-CM | POA: Insufficient documentation

## 2022-06-22 DIAGNOSIS — R0789 Other chest pain: Secondary | ICD-10-CM | POA: Diagnosis not present

## 2022-06-22 DIAGNOSIS — R079 Chest pain, unspecified: Secondary | ICD-10-CM | POA: Diagnosis not present

## 2022-06-22 LAB — CBC
HCT: 40.7 % (ref 39.0–52.0)
Hemoglobin: 14.6 g/dL (ref 13.0–17.0)
MCH: 31.2 pg (ref 26.0–34.0)
MCHC: 35.9 g/dL (ref 30.0–36.0)
MCV: 87 fL (ref 80.0–100.0)
Platelets: 199 10*3/uL (ref 150–400)
RBC: 4.68 MIL/uL (ref 4.22–5.81)
RDW: 13 % (ref 11.5–15.5)
WBC: 8.3 10*3/uL (ref 4.0–10.5)
nRBC: 0 % (ref 0.0–0.2)

## 2022-06-22 LAB — BASIC METABOLIC PANEL
Anion gap: 12 (ref 5–15)
BUN: 8 mg/dL (ref 6–20)
CO2: 23 mmol/L (ref 22–32)
Calcium: 9.2 mg/dL (ref 8.9–10.3)
Chloride: 100 mmol/L (ref 98–111)
Creatinine, Ser: 0.95 mg/dL (ref 0.61–1.24)
GFR, Estimated: >60 mL/min (ref >60)
Glucose, Bld: 94 mg/dL (ref 70–99)
Potassium: 3.7 mmol/L (ref 3.5–5.1)
Sodium: 135 mmol/L (ref 135–145)

## 2022-06-22 LAB — TROPONIN I (HIGH SENSITIVITY)
Troponin I (High Sensitivity): 3 ng/L (ref <18)
Troponin I (High Sensitivity): 3 ng/L (ref <18)

## 2022-06-22 NOTE — ED Provider Triage Note (Signed)
Emergency Medicine Provider Triage Evaluation Note  Eric Fisher. , a 56 y.o. male  was evaluated in triage.  Pt complains of burning chest pain since yesterday, intermittent.  Denies any shortness of breath, nausea, vomiting, lightheadedness, diaphoresis.  He describes it as a burning sensation.  Reports he has had GERD before but was felt more of a chest tightness.  Review of Systems  Positive:  Negative:   Physical Exam  BP 120/89 (BP Location: Right Arm)   Pulse 90   Temp 98.8 F (37.1 C) (Oral)   Resp 16   SpO2 99%  Gen:   Awake, no distress   Resp:  Normal effort  MSK:   Moves extremities without difficulty  Other:  Nontender to palpation.  Patient well-appearing.  Medical Decision Making  Medically screening exam initiated at 5:41 PM.  Appropriate orders placed.  Sani Madariaga. was informed that the remainder of the evaluation will be completed by another provider, this initial triage assessment does not replace that evaluation, and the importance of remaining in the ED until their evaluation is complete.  Cardiac labs placed.   Sherrell Puller, PA-C 06/22/22 1743

## 2022-06-22 NOTE — ED Triage Notes (Signed)
Patient complains of intermittent chest pain since yesterday. Patient describes pain a burning sensation in center of chest, patient states he took one SL NTG today with no relief of pain.

## 2022-06-23 MED ORDER — LIDOCAINE VISCOUS HCL 2 % MT SOLN
15.0000 mL | Freq: Once | OROMUCOSAL | Status: AC
  Administered 2022-06-23: 15 mL via ORAL
  Filled 2022-06-23: qty 15

## 2022-06-23 MED ORDER — ALUM & MAG HYDROXIDE-SIMETH 200-200-20 MG/5ML PO SUSP
30.0000 mL | Freq: Once | ORAL | Status: AC
  Administered 2022-06-23: 30 mL via ORAL
  Filled 2022-06-23: qty 30

## 2022-06-23 NOTE — ED Provider Notes (Signed)
[facility] EMERGENCY DEPARTMENT Provider Note   CSN: [id number] Arrival date & time: 06/22/22  1702     History  Chief Complaint  Patient presents with   Chest Pain    Eric Fisher. is a 56 y.o. male.   Chest Pain Patient is a 56 year old male with a past medical history significant for embolic NSTEMI in 7591 was diagnosed with "hypercoagulable state" but uncertain of specific condition has been on Eliquis with no missed doses recently also takes aspirin 81 mg daily, HTN, anxiety, reflux followed by GI and on rabeprazole.   Patient presents emergency room with complaints of burning chest pain since yesterday has been intermittent he states that he has had no shortness of breath, nausea, vomiting, lightheadedness or swelling.  He states that the pain in his chest is nonradiating.  He states that he does have a history of reflux and is on PPI for this for many years.  Denies any exertional or pleuritic component to the pain.  Nothing seems to elicit the pain he states that it seems to come and go.  He states that he last experienced chest pain several hours ago.  He has been in emergency room for approximately 16 hours at this time.      Home Medications Prior to Admission medications   Medication Sig Start Date End Date Taking? Authorizing Provider  acetaminophen (TYLENOL) 500 MG tablet Take 1,000 mg by mouth daily as needed for moderate pain.     [provider]  ALPRAZolam Duanne Moron) 0.5 MG tablet Take 0.5 mg by mouth 2 (two) times daily as needed for anxiety.  05/03/11   [provider]  amitriptyline (ELAVIL) 10 MG tablet Take 1 tablet (10 mg total) by mouth at bedtime. 11/08/21   Annitta Needs, NP  apixaban (ELIQUIS) 5 MG TABS tablet Take 5 mg by mouth 2 (two) times daily.    [provider]  aspirin 81 MG tablet Take 81 mg by mouth every evening.     [provider]  atorvastatin (LIPITOR) 40 MG tablet TAKE 1 TABLET BY  MOUTH DAILY 04/24/22   Nahser, Wonda Cheng, MD  fluticasone (FLONASE) 50 MCG/ACT nasal spray Place 1 spray into both nostrils daily.    [provider]  hydrochlorothiazide (HYDRODIURIL) 25 MG tablet TAKE 1 TABLET BY MOUTH DAILY 04/24/22   Nahser, Wonda Cheng, MD  metoprolol tartrate (LOPRESSOR) 25 MG tablet TAKE 1 TABLET BY MOUTH TWICE  DAILY 04/09/22   Nahser, Wonda Cheng, MD  Multiple Vitamin (MULTIVITAMIN WITH MINERALS) TABS Take 1 tablet by mouth 2 (two) times daily.     [provider]  nitroGLYCERIN (NITROSTAT) 0.4 MG SL tablet Place 1 tablet (0.4 mg total) under the tongue every 5 (five) minutes x 3 doses as needed. Chest pain 10/17/17   Nahser, Wonda Cheng, MD  potassium chloride (KLOR-CON M) 10 MEQ tablet TAKE 1 TABLET BY MOUTH DAILY 04/24/22   Nahser, Wonda Cheng, MD  RABEprazole (ACIPHEX) 20 MG tablet TAKE 1 TABLET BY MOUTH  DAILY 04/07/21   Mahala Menghini, PA-C      Allergies    Patient has no known allergies.    Review of Systems   Review of Systems  Cardiovascular:  Positive for chest pain.    Physical Exam Updated Vital Signs BP 127/88   Pulse 90   Temp 98 F (36.7 C) (Oral)   Resp 20   SpO2 99%  Physical Exam Vitals and nursing note  reviewed.  Constitutional:      General: He is not in acute distress. HENT:     Head: Normocephalic and atraumatic.     Nose: Nose normal.     Mouth/Throat:     Mouth: Mucous membranes are moist.  Eyes:     General: No scleral icterus. Cardiovascular:     Rate and Rhythm: Normal rate and regular rhythm.     Pulses: Normal pulses.     Heart sounds: Normal heart sounds.     Comments: Bilateral radial artery pulses 3+ and symmetric Pulmonary:     Effort: Pulmonary effort is normal. No respiratory distress.     Breath sounds: No wheezing.  Abdominal:     Palpations: Abdomen is soft.     Tenderness: There is no abdominal tenderness. There is no guarding or rebound.  Musculoskeletal:     Cervical back: Normal range of motion.      Right lower leg: No edema.     Left lower leg: No edema.  Skin:    General: Skin is warm and dry.     Capillary Refill: Capillary refill takes less than 2 seconds.  Neurological:     Mental Status: He is alert. Mental status is at baseline.  Psychiatric:        Mood and Affect: Mood normal.        Behavior: Behavior normal.     ED Results / Procedures / Treatments   Labs (all labs ordered are listed, but only abnormal results are displayed) Labs Reviewed  BASIC METABOLIC PANEL  CBC  TROPONIN I (HIGH SENSITIVITY)  TROPONIN I (HIGH SENSITIVITY)    EKG EKG Interpretation  Date/Time:  Friday June 22 2022 17:18:20 EDT Ventricular Rate:  88 PR Interval:  174 QRS Duration: 96 QT Interval:  378 QTC Calculation: 457 R Axis:   73 Text Interpretation: Sinus rhythm with occasional Premature ventricular complexes Confirmed by Lajean Saver [phone]) on 06/23/2022 8:00:22 AM  Radiology DG Chest 2 View  Result Date: 06/22/2022 CLINICAL DATA:  Intermittent chest pain since yesterday EXAM: CHEST - 2 VIEW COMPARISON:  05/17/2012 FINDINGS: The heart size and mediastinal contours are within normal limits. Both lungs are clear. The visualized skeletal structures are unremarkable. IMPRESSION: No active cardiopulmonary disease. Electronically Signed   By: Randa Ngo M.D.   On: 06/22/2022 18:20    Procedures Procedures    Medications Ordered in ED Medications  alum & mag hydroxide-simeth (MAALOX/MYLANTA) 200-200-20 MG/5ML suspension 30 mL (30 mLs Oral Given 06/23/22 0806)    And  lidocaine (XYLOCAINE) 2 % viscous mouth solution 15 mL (15 mLs Oral Given 06/23/22 5621)    ED Course/ Medical Decision Making/ A&P                           Medical Decision Making Amount and/or Complexity of Data Reviewed Labs: ordered. Radiology: ordered.  Risk OTC drugs. Prescription drug management.   This patient presents to the ED for concern of CP, this involves a number of treatment  options, and is a complaint that carries with it a high risk of complications and morbidity.   The emergent causes of chest pain include: Acute coronary syndrome, tamponade, pericarditis/myocarditis, aortic dissection, pulmonary embolism, tension pneumothorax, pneumonia, and esophageal rupture.   I do not believe the patient has an emergent cause of chest pain, other urgent/non-acute considerations include, but are not limited to: chronic angina, aortic stenosis, cardiomyopathy, mitral valve prolapse, pulmonary hypertension,  aortic insufficiency, right ventricular hypertrophy, pleuritis, bronchitis, pneumothorax, tumor, gastroesophageal reflux disease (GERD), esophageal spasm, Mallory-Weiss syndrome, peptic ulcer disease, pancreatitis, functional gastrointestinal pain, cervical or thoracic disk disease or arthritis, shoulder arthritis, costochondritis, subacromial bursitis, anxiety or panic attack, herpes zoster, breast disorders, chest wall tumors, thoracic outlet syndrome, mediastinitis.    Co morbidities: Discussed in HPI   Brief History:  Patient is a 56 year old male with a past medical history significant for embolic NSTEMI in 2458 was diagnosed with "hypercoagulable state" but uncertain of specific condition has been on Eliquis with no missed doses recently also takes aspirin 81 mg daily, HTN, anxiety, reflux followed by GI and on rabeprazole.   Patient presents emergency room with complaints of burning chest pain since yesterday has been intermittent he states that he has had no shortness of breath, nausea, vomiting, lightheadedness or swelling.  He states that the pain in his chest is nonradiating.  He states that he does have a history of reflux and is on PPI for this for many years.  Denies any exertional or pleuritic component to the pain.  Nothing seems to elicit the pain he states that it seems to come and go.  He states that he last experienced chest pain several hours ago.  He has  been in emergency room for approximately 16 hours at this time.    EMR reviewed including pt PMHx, past surgical history and past visits to ER.   See HPI for more details   Lab Tests:   I personally reviewed all laboratory work and imaging. Metabolic panel without any acute abnormality specifically kidney function within normal limits and no significant electrolyte abnormalities. CBC without leukocytosis or significant anemia.   Imaging Studies:  NAD. I personally reviewed all imaging studies and no acute abnormality found. I agree with radiology interpretation.    Cardiac Monitoring:  NA EKG non-ischemic   Medicines ordered:  I ordered medication including Maalox and viscous lidocaine for burning chest pain Reevaluation of the patient after these medicines showed that the patient improved I have reviewed the patients home medicines and have made adjustments as needed   Critical Interventions:     Consults/Attending Physician   I discussed this case with my attending physician who cosigned this note including patient's presenting symptoms, physical exam, and planned diagnostics and interventions. Attending physician stated agreement with plan or made changes to plan which were implemented. Specifically discussed w MD given pt's complex medical hx.    Reevaluation:  After the interventions noted above I re-evaluated patient and found that they have :improved   Social Determinants of Health:      Problem List / ED Course:  Burning chest pain nonradiating nonexertional nonpleuritic.  Patient is on DOAC anticoagulation low suspicion for PE.  Symptoms most consistent with reflux.  He follows with gastroenterology and recommend cardiology and gastroenterology follow-up.  Return precautions discussed.   Dispostion:  After consideration of the diagnostic results and the patients response to treatment, I feel that the patent would benefit from close outpatient  follow-up.   Final Clinical Impression(s) / ED Diagnoses Final diagnoses:  Chest pain, unspecified type    Rx / DC Orders ED Discharge Orders     None         Tedd Sias, [place] 06/23/22 1051    Lajean Saver, MD 06/23/22 1129

## 2022-06-23 NOTE — Discharge Instructions (Addendum)
Continue taking all meds as prescribed.   Please call your cardiologist to update them on your ED visit today.  Please call your gastroenterologist to discuss possible additional treatments for your reflux and schedule a follow up visit.   RETURN TO THE ER FOR ANY NEW OR WORSENING SYMPTOMS

## 2022-06-25 ENCOUNTER — Encounter: Payer: Self-pay | Admitting: Cardiovascular Disease

## 2022-06-25 ENCOUNTER — Ambulatory Visit: Payer: BC Managed Care – PPO | Attending: Cardiovascular Disease | Admitting: Cardiovascular Disease

## 2022-06-25 VITALS — BP 112/78 | HR 82 | Ht 71.0 in | Wt 206.8 lb

## 2022-06-25 DIAGNOSIS — I249 Acute ischemic heart disease, unspecified: Secondary | ICD-10-CM

## 2022-06-25 DIAGNOSIS — M303 Mucocutaneous lymph node syndrome [Kawasaki]: Secondary | ICD-10-CM | POA: Diagnosis not present

## 2022-06-25 DIAGNOSIS — I479 Paroxysmal tachycardia, unspecified: Secondary | ICD-10-CM | POA: Diagnosis not present

## 2022-06-25 NOTE — Progress Notes (Signed)
Cardiology Office Note   Date:  06/25/2022   ID:  Eric Setting., DOB [birth year], MRN [id number]  PCP:  Ginger Organ., MD  Cardiologist:   Mertie Moores, MD   Chief Complaint  Patient presents with   Coronary Artery Disease        1. 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 Hyperlipidemia   History of Present Illness: Eric Fisher is a 56 year old gentleman with a history of coronary artery disease. 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 We treated with Brilinta, aspirin, and Coumadin. He's had some atypical episodes of chest pain since last saw him. None of his symptoms were similar to his presenting episodes of angina. He exercises on a daily basis and does not have any episodes of chest pain.  He has been maintained on Brilinta and Coumadin he seems to be doing fairly well. He is quite active and has not had any recurrent episodes of angina.  Dec. 9, 2013 When I last saw him several months ago we discontinued his Coumadin. We have maintained the Brilinta and ASA 81 QD. He has been able to all of his normal activities without any chest pain.  March 16, 2013:  He denies any chest pain.  He is getting some regular exercise.  March 18, 2014:  Eric Fisher is doing very well. He's not had any episodes of chest pain or shortness breath. He has remained very active.  He's been very busy at work. He's looking forward to getting back into an exercise regimen this summer.   April 04, 2015:   Eric Giuliani Duante Arocho. is a 56 y.o. male who presents for follow up of his CAD Doing well.  No CP .    April 03, 2016:  Eric Fisher is doing well.   Has had dx of CAD for 5 years.   Kawasaki's disease  Exercising some .   Putting in long hours at work .    Aug. 23, 2018  Eric Fisher is doing well. He has a history of coronary artery disease-Kawasaki's disease. He has been maintained on low-dose Brilinta and is  doing quite well. 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   September night, 2019: Eric Fisher is seen today for follow-up of his coronary artery disease ( Kawasaki's disease) and DVT.  He was found to have a DVT on January 24, 2018: The Brilinta was stopped and he was started on Eliquis 5 mg twice a day.  It sounds like he had clotting factors measured by Dr. Brigitte Pulse.  I do not have the results but it sounds like he did have an abnormality which would cause him to be hypercoagulable.  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  Blood Pressure is  elevated today.   March 16, 2020:  Eric Fisher is seen today for follow-up visit of his coronary artery disease and hypercoagulable state.  He also has a history of hypertension and hyperlipidemia.  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  He has a history of a DVT.  He is now on Eliquis 5 mg twice a day. Diastolic BP is a little elevated .   Recheck bp is fine   March 24, 2021: Eric Fisher is seen today for follow up of his CAD and hypercoaguable state. Hx of DVT  Doing well from a cardiac standpoint.    April 16, 2022 Eric Fisher is seen today for follow up of his CAD, hypercoagulable state. Hx of DVT No cp  Looking forward to getting more exercise this summer    Lipids from Dr. Raul Del office shows Total cholesterol is 121 HDL is 32 LDL is 74 Triglyceride level is 72 Hemoglobin A1c is 5.1 Creatinine is 0.87.   Potassium is 4.5.   June 25, 2022: Eric Fisher is seen today for follow-up of his coronary artery disease and hypercoagulable state.  6 days ago  He recently presented to the Urgent Care with a 3-day history of chills, hot flashes, headache, generalized body aches and fatigue.    Troponins were negative x2. Viral serologies were negative for influenza A, influenza B, coronavirus   3 days ago, he some burning in his chest , Not constant Would could and  go BP was ok.   HR was 156.  His heart rate felt irregular and fast.  He specifically did not note any irregularity to his heart rate. Had mowed the yard using a riding lawn more.  He did not specifically feel dehydrated.  Went back to the ER  No troponins were checked during this ER visit.  Walks on occasion .  No CP with waking .   The ER doctor thought it might be reflux     Past Medical History:  Diagnosis Date   Anxiety    Complication of anesthesia    Coronary artery disease    ectatic coronaries per cath in August 2013 - managed with Brilinta and aspirin   Gallbladder polyp 05/2011   on abd u/s   Gallstones 05/2011   on abd u/s   GERD (gastroesophageal reflux disease)    Hemorrhoid    s/p surgery August 2013   Hypertension    he denies   MI, acute, non ST segment elevation ([place]) 03/2011   thrombus of LAD   PONV (postoperative nausea and vomiting)     Past Surgical History:  Procedure Laterality Date   BRAVO [place] STUDY  02/2008   Day 1, 155 episodes of acid reflux with longest episode 29 minutes, De[place]ter 41.7. Day 2, 51 episodes, [place] 12.4. Two of three episodes of chest pain correlated with episode of acid reflux. Study done OFF PPI.   CARDIAC CATHETERIZATION  03/11/2011   This demonstrated marked ectasia of the proximal LAD with suggestion of a large filling defect consistent  with thrombus.  There was clot extending into the first septal   perforating branch which had slow flow.    CARDIAC CATHETERIZATION  05/19/2012   Angiographic data- large and slightly ectatic left main, no stenosis or thrombi, pLAD extremely large and ectatic, no thombi, remained LAD normal; normal LCx without stenosis or ectasia; minor luminal RCA irregularities, dRCA mildly ectatic; PDA and PLSA normal size.    CHOLECYSTECTOMY N/A 04/17/2013   Procedure: LAPAROSCOPIC CHOLECYSTECTOMY;  Surgeon: Jamesetta So, MD;  Location: [facility];  Service: General;  Laterality: N/A;   COLONOSCOPY N/A  05/15/2019   Procedure: COLONOSCOPY;  Surgeon: Daneil Dolin, MD;  Location: [facility];  Service: Endoscopy;  Laterality: N/A;  2:15pm   ESOPHAGOGASTRODUODENOSCOPY  02/2008   small hh   ESOPHAGOGASTRODUODENOSCOPY  06/19/2011   Dr. Vivi Ferns esophagus, small hiatal hernia o/w normal stomach   HEMORRHOID SURGERY  05/23/2012   Procedure: HEMORRHOIDECTOMY;  Surgeon: Jamesetta So, MD;  Location: [facility];  Service: General;  Laterality: N/A;   HEMORROIDECTOMY     KNEE ARTHROSCOPY  LEFT HEART CATHETERIZATION WITH CORONARY ANGIOGRAM N/A 05/19/2012   Procedure: LEFT HEART CATHETERIZATION WITH CORONARY ANGIOGRAM;  Surgeon: Thayer Headings, MD;  Location: [facility] CATH LAB;  Service: Cardiovascular;  Laterality: N/A;   POLYPECTOMY  05/15/2019   Procedure: POLYPECTOMY;  Surgeon: Daneil Dolin, MD;  Location: [facility];  Service: Endoscopy;;  splenic flex and hepatic flex   TRANSTHORACIC ECHOCARDIOGRAM  03/13/2011   Left ventricle: Mild distal septal hypokinesis The cavity size was normal. Systolic function was normal. The estimated ejection fraction was 55%. Wall motion was normal     Current Outpatient Medications  Medication Sig Dispense Refill   acetaminophen (TYLENOL) 500 MG tablet Take 1,000 mg by mouth daily as needed for moderate pain.      ALPRAZolam (XANAX) 0.5 MG tablet Take 0.5 mg by mouth 2 (two) times daily as needed for anxiety.      amitriptyline (ELAVIL) 10 MG tablet Take 1 tablet (10 mg total) by mouth at bedtime. 90 tablet 3   apixaban (ELIQUIS) 5 MG TABS tablet Take 5 mg by mouth 2 (two) times daily.     aspirin 81 MG tablet Take 81 mg by mouth every evening.      atorvastatin (LIPITOR) 40 MG tablet TAKE 1 TABLET BY MOUTH DAILY 90 tablet 3   fluticasone (FLONASE) 50 MCG/ACT nasal spray Place 1 spray into both nostrils daily.     hydrochlorothiazide (HYDRODIURIL) 25 MG tablet TAKE 1 TABLET BY MOUTH DAILY 90 tablet 3   metoprolol tartrate (LOPRESSOR) 25 MG tablet TAKE 1 TABLET BY  MOUTH TWICE  DAILY 30 tablet 0   Multiple Vitamin (MULTIVITAMIN WITH MINERALS) TABS Take 1 tablet by mouth 2 (two) times daily.      nitroGLYCERIN (NITROSTAT) 0.4 MG SL tablet Place 1 tablet (0.4 mg total) under the tongue every 5 (five) minutes x 3 doses as needed. Chest pain 25 tablet 3   potassium chloride (KLOR-CON M) 10 MEQ tablet TAKE 1 TABLET BY MOUTH DAILY 90 tablet 3   RABEprazole (ACIPHEX) 20 MG tablet TAKE 1 TABLET BY MOUTH  DAILY 90 tablet 3   No current facility-administered medications for this visit.    Allergies:   Patient has no known allergies.    Social History:  The patient  reports that he has never smoked. He has never used smokeless tobacco. He reports that he does not drink alcohol and does not use drugs.   Family History:  The patient's family history includes Arrhythmia in his father; GER disease in his father; Heart Problems in his father.    ROS:   Noted in current history, otherwise review of systems is negative.   Physical Exam: Blood pressure 112/78, pulse 82, height '5\' 11"'$  (1.803 m), weight 206 lb 12.8 oz (93.8 kg), SpO2 98 %.     GEN:  Well nourished, well developed in no acute distress HEENT: Normal NECK: No JVD; No carotid bruits LYMPHATICS: No lymphadenopathy CARDIAC: RRR , no murmurs, rubs, gallops RESPIRATORY:  Clear to auscultation without rales, wheezing or rhonchi  ABDOMEN: Soft, non-tender, non-distended MUSCULOSKELETAL:  No edema; No deformity  SKIN: Warm and dry NEUROLOGIC:  Alert and oriented x 3    EKG:    June 22, 2022: Normal sinus rhythm at 88.  Rare premature ventricular contractions.  No ST or T wave changes.   Recent Labs: 04/16/2022: ALT 35 06/22/2022: BUN 8; Creatinine, Ser 0.95; Hemoglobin 14.6; Platelets 199; Potassium 3.7; Sodium 135    Lipid Panel  Component Value Date/Time   CHOL 106 04/16/2022 1119   CHOL 90 03/15/2014 0743   TRIG 66 04/16/2022 1119   TRIG 86 03/15/2014 0743   HDL 44 04/16/2022 1119    HDL 37 (L) 03/15/2014 0743   CHOLHDL 2.4 04/16/2022 1119   CHOLHDL 2.4 04/03/2016 0812   VLDL 14 04/03/2016 0812   LDLCALC 48 04/16/2022 1119   LDLCALC 36 03/15/2014 0743      Wt Readings from Last 3 Encounters:  06/25/22 206 lb 12.8 oz (93.8 kg)  04/16/22 213 lb 9.6 oz (96.9 kg)  09/26/21 219 lb (99.3 kg)      Other studies Reviewed: Additional studies/ records that were reviewed today include: . Review of the above records demonstrates:    ASSESSMENT AND PLAN:  1. Coronary artery disease-  .    Denies having any episodes of chest pain.  He has severely dilated coronary arteries similar to Kawasaki disease.  He has had thrombosis of his coronary arteries.  This was all due to a hypercoagulable state that was not yet diagnosed.   He has not had any further episodes of chest discomfort.  This episode last Friday may have been due to reflux.    2.   hypercoagulable state:  .      2. Hyperlipidemia  -         4.  HTN:     Blood pressure is well controlled  5.  Tachycardia: If had an episode of fast heart rate in the 150s lasting for at least 30 minutes or.  It resolved by the time he got to the ER.  I considered placing an event monitor on him but he is not having any further episodes of fast heart rate.  He is on Eliquis for his hypercoagulability already so if it does turn out that he has atrial fibrillation he is already on the proper anticoagulation.    We will see him again in 6 months for follow-up visit  Current medicines are reviewed at length with the patient today.  The patient does not have concerns regarding medicines.  The following changes have been made:  no change  Labs/ tests ordered today include:   No orders of the defined types were placed in this encounter.     Disposition:   FU with me in 1 year      Mertie Moores, MD  06/25/2022 3:08 PM    [facility] [address], [place], [place]  [address] Phone: [phone];  Fax: [phone]

## 2022-06-25 NOTE — Patient Instructions (Signed)
Medication Instructions:  Your physician recommends that you continue on your current medications as directed. Please refer to the Current Medication list given to you today.  *If you need a refill on your cardiac medications before your next appointment, please call your pharmacy*  Lab Work: NONE If you have labs (blood work) drawn today and your tests are completely normal, you will receive your results only by: MyChart Message (if you have MyChart) OR A paper copy in the mail If you have any lab test that is abnormal or we need to change your treatment, we will call you to review the results.  Testing/Procedures: NONE  Follow-Up: At [facility], you and your health needs are our priority.  As part of our continuing mission to provide you with exceptional heart care, we have created designated Provider Care Teams.  These Care Teams include your primary Cardiologist (physician) and Advanced Practice Providers (APPs -  Physician Assistants and Nurse Practitioners) who all work together to provide you with the care you need, when you need it.  Your next appointment:   6 month(s)  The format for your next appointment:   In Person  Provider:   Philip Nahser, MD    Important Information About Sugar       

## 2022-07-02 DIAGNOSIS — R0789 Other chest pain: Secondary | ICD-10-CM | POA: Diagnosis not present

## 2022-07-02 DIAGNOSIS — R109 Unspecified abdominal pain: Secondary | ICD-10-CM | POA: Diagnosis not present

## 2022-07-03 ENCOUNTER — Other Ambulatory Visit: Payer: Self-pay | Admitting: Internal Medicine

## 2022-07-03 DIAGNOSIS — R0789 Other chest pain: Secondary | ICD-10-CM

## 2022-07-04 ENCOUNTER — Other Ambulatory Visit: Payer: BC Managed Care – PPO

## 2022-07-04 ENCOUNTER — Encounter (HOSPITAL_COMMUNITY): Payer: Self-pay

## 2022-07-04 ENCOUNTER — Ambulatory Visit (HOSPITAL_COMMUNITY)
Admission: RE | Admit: 2022-07-04 | Discharge: 2022-07-04 | Disposition: A | Discharge status: Home / Self Care | Payer: BC Managed Care – PPO | Source: Ambulatory Visit | Attending: Internal Medicine | Admitting: Internal Medicine

## 2022-07-04 DIAGNOSIS — Z9049 Acquired absence of other specified parts of digestive tract: Secondary | ICD-10-CM | POA: Diagnosis not present

## 2022-07-04 DIAGNOSIS — R0789 Other chest pain: Secondary | ICD-10-CM | POA: Insufficient documentation

## 2022-07-04 MED ORDER — IOHEXOL 350 MG/ML SOLN
100.0000 mL | Freq: Once | INTRAVENOUS | Status: AC | PRN
  Administered 2022-07-04: 75 mL via INTRAVENOUS

## 2022-07-12 DIAGNOSIS — H40013 Open angle with borderline findings, low risk, bilateral: Secondary | ICD-10-CM | POA: Diagnosis not present

## 2022-07-12 DIAGNOSIS — H25013 Cortical age-related cataract, bilateral: Secondary | ICD-10-CM | POA: Diagnosis not present

## 2022-07-12 DIAGNOSIS — H524 Presbyopia: Secondary | ICD-10-CM | POA: Diagnosis not present

## 2022-07-12 DIAGNOSIS — H2513 Age-related nuclear cataract, bilateral: Secondary | ICD-10-CM | POA: Diagnosis not present

## 2022-07-12 DIAGNOSIS — H35371 Puckering of macula, right eye: Secondary | ICD-10-CM | POA: Diagnosis not present

## 2022-07-16 ENCOUNTER — Ambulatory Visit: Payer: BC Managed Care – PPO | Admitting: Internal Medicine

## 2022-07-23 ENCOUNTER — Ambulatory Visit (INDEPENDENT_AMBULATORY_CARE_PROVIDER_SITE_OTHER): Payer: BC Managed Care – PPO | Admitting: Internal Medicine

## 2022-07-23 ENCOUNTER — Encounter: Payer: Self-pay | Admitting: Internal Medicine

## 2022-07-23 VITALS — BP 124/87 | HR 88 | Temp 98.2°F | Ht 71.0 in | Wt 209.6 lb

## 2022-07-23 DIAGNOSIS — K219 Gastro-esophageal reflux disease without esophagitis: Secondary | ICD-10-CM

## 2022-07-23 DIAGNOSIS — R0789 Other chest pain: Secondary | ICD-10-CM | POA: Diagnosis not present

## 2022-07-23 NOTE — Progress Notes (Unsigned)
Primary Care Physician:  Ginger Organ., MD Primary Gastroenterologist:  Dr. Gala Romney  Pre-Procedure History & Physical: HPI:  Eric Fisher. is a 56 y.o. male here for follow-up of GERD manifest as heartburn and chest pain.  Historically,  well controlled on rabeprazole 20 mg daily / Elavil 10 mg nightly.  Several weeks ago he started having worsening of heartburn/chest pain.  Presented to the ED where he was evaluated  -  not felt to be cardiac in etiology.  CTA of the chest was negative.  Saw Dr. Acie Fredrickson who did not feel it was cardiac in origin.  Also, saw his PCP; rabeprazole escalated to 20 mg twice daily.  Pepcid AC twice daily added to his regimen as well as cyclobenzaprine per patient.  All of his symptoms have resolved.  He is not having any dysphagia.  There is no exertional component to his symptoms.  Her EGD about 10 years ago demonstrated only a small hiatal hernia.  No esophagitis.  Bravo study off acid suppression therapy demonstrated abnormal gastroesophageal reflux and reasonably good correlation with symptoms.  He is due for surveillance colonoscopy (history of colon polyps) in 2025.  Past Medical History:  Diagnosis Date   Anxiety    Complication of anesthesia    Coronary artery disease    ectatic coronaries per cath in August 2013 - managed with Brilinta and aspirin   Gallbladder polyp 05/2011   on abd u/s   Gallstones 05/2011   on abd u/s   GERD (gastroesophageal reflux disease)    Hemorrhoid    s/p surgery August 2013   Hypertension    he denies   MI, acute, non ST segment elevation ([place]) 03/2011   thrombus of LAD   PONV (postoperative nausea and vomiting)     Past Surgical History:  Procedure Laterality Date   BRAVO [place] STUDY  02/2008   Day 1, 155 episodes of acid reflux with longest episode 29 minutes, [place]ter 41.7. Day 2, 51 episodes, [place] 12.4. Two of three episodes of chest pain correlated with episode of acid reflux. Study done OFF  PPI.   CARDIAC CATHETERIZATION  03/11/2011   This demonstrated marked ectasia of the proximal LAD with suggestion of a large filling defect consistent  with thrombus.  There was clot extending into the first septal   perforating branch which had slow flow.    CARDIAC CATHETERIZATION  05/19/2012   Angiographic data- large and slightly ectatic left main, no stenosis or thrombi, pLAD extremely large and ectatic, no thombi, remained LAD normal; normal LCx without stenosis or ectasia; minor luminal RCA irregularities, dRCA mildly ectatic; PDA and PLSA normal size.    CHOLECYSTECTOMY N/A 04/17/2013   Procedure: LAPAROSCOPIC CHOLECYSTECTOMY;  Surgeon: Jamesetta So, MD;  Location: [facility];  Service: General;  Laterality: N/A;   COLONOSCOPY N/A 05/15/2019   Procedure: COLONOSCOPY;  Surgeon: Daneil Dolin, MD;  [facility];  Service: Endoscopy;  Laterality: N/A;  2:15pm   ESOPHAGOGASTRODUODENOSCOPY  02/2008   small hh   ESOPHAGOGASTRODUODENOSCOPY  06/19/2011   Dr. Vivi Ferns esophagus, small hiatal hernia o/w normal stomach   HEMORRHOID SURGERY  05/23/2012   Procedure: HEMORRHOIDECTOMY;  Surgeon: Jamesetta So, MD;  Location: [facility];  Service: General;  Laterality: N/A;   HEMORROIDECTOMY     KNEE ARTHROSCOPY     LEFT HEART CATHETERIZATION WITH CORONARY ANGIOGRAM N/A 05/19/2012   Procedure: LEFT HEART CATHETERIZATION WITH CORONARY ANGIOGRAM;  Surgeon: Wonda Cheng Nahser,  MD;  Location: [facility] CATH LAB;  Service: Cardiovascular;  Laterality: N/A;   POLYPECTOMY  05/15/2019   Procedure: POLYPECTOMY;  Surgeon: Daneil Dolin, MD;  [facility];  Service: Endoscopy;;  splenic flex and hepatic flex   TRANSTHORACIC ECHOCARDIOGRAM  03/13/2011   Left ventricle: Mild distal septal hypokinesis The cavity size was normal. Systolic function was normal. The estimated ejection fraction was 55%. Wall motion was normal    Prior to Admission medications   Medication Sig Start Date End Date Taking? Authorizing  Provider  acetaminophen (TYLENOL) 500 MG tablet Take 1,000 mg by mouth daily as needed for moderate pain.    Yes [provider]  ALPRAZolam Duanne Moron) 0.5 MG tablet Take 0.5 mg by mouth 2 (two) times daily as needed for anxiety.  05/03/11  Yes [provider]  amitriptyline (ELAVIL) 10 MG tablet Take 1 tablet (10 mg total) by mouth at bedtime. 11/08/21  Yes Annitta Needs, NP  apixaban (ELIQUIS) 5 MG TABS tablet Take 5 mg by mouth 2 (two) times daily.   Yes [provider]  aspirin 81 MG tablet Take 81 mg by mouth every evening.    Yes [provider]  atorvastatin (LIPITOR) 40 MG tablet TAKE 1 TABLET BY MOUTH DAILY 04/24/22  Yes Nahser, Wonda Cheng, MD  cyclobenzaprine (FLEXERIL) 10 MG tablet SMARTSIG:0.5-1 Tablet(s) By Mouth 07/03/22  Yes [provider]  famotidine (PEPCID) 20 MG tablet Take 20 mg by mouth 2 (two) times daily.   Yes [provider]  fluticasone (FLONASE) 50 MCG/ACT nasal spray Place 1 spray into both nostrils daily.   Yes [provider]  hydrochlorothiazide (HYDRODIURIL) 25 MG tablet TAKE 1 TABLET BY MOUTH DAILY 04/24/22  Yes Nahser, Wonda Cheng, MD  metoprolol tartrate (LOPRESSOR) 25 MG tablet TAKE 1 TABLET BY MOUTH TWICE  DAILY 04/09/22  Yes Nahser, Wonda Cheng, MD  Multiple Vitamin (MULTIVITAMIN WITH MINERALS) TABS Take 1 tablet by mouth 2 (two) times daily.    Yes [provider]  nitroGLYCERIN (NITROSTAT) 0.4 MG SL tablet Place 1 tablet (0.4 mg total) under the tongue every 5 (five) minutes x 3 doses as needed. Chest pain 10/17/17  Yes Nahser, Wonda Cheng, MD  potassium chloride (KLOR-CON M) 10 MEQ tablet TAKE 1 TABLET BY MOUTH DAILY 04/24/22  Yes Nahser, Wonda Cheng, MD  RABEprazole (ACIPHEX) 20 MG tablet TAKE 1 TABLET BY MOUTH  DAILY Patient taking differently: Take 20 mg by mouth 2 (two) times daily. TAKE 1 TABLET BY MOUTH  DAILY 04/07/21  Yes Mahala Menghini, PA-C    Allergies as of 07/23/2022   (No Known Allergies)     Family History  Problem Relation Age of Onset   Arrhythmia Father        a. flutter   GER disease Father    Heart Problems Father        nonischemic cardiomyopathy, ASCVD   Colon cancer Neg Hx     Social History   Socioeconomic History   Marital status: Single    Spouse name: Not on file   Number of children: 0   Years of education: Not on file   Highest education level: Not on file  Occupational History   Occupation: maintenance    Employer: [employer]  Tobacco Use   Smoking status: Never   Smokeless tobacco: Never  Vaping Use   Vaping Use: Never used  Substance and Sexual Activity   Alcohol use: No   Drug use: No  Sexual activity: Not Currently  Other Topics Concern   Not on file  Social History Narrative   Not on file   Social Determinants of Health   Financial Resource Strain: Not on file  Food Insecurity: Not on file  Transportation Needs: Not on file  Physical Activity: Not on file  Stress: Not on file  Social Connections: Not on file  Intimate Partner Violence: Not on file    Review of Systems: See HPI, otherwise negative ROS  Physical Exam: BP 124/87 (BP Location: Left Arm, Patient Position: Sitting, Cuff Size: Normal)   Pulse 88   Temp 98.2 F (36.8 C) (Oral)   Ht '5\' 11"'$  (1.803 m)   Wt 209 lb 9.6 oz (95.1 kg)   SpO2 98%   BMI 29.23 kg/m  General:   Alert,  Well-developed, well-nourished, pleasant and cooperative in NAD Neck:  Supple; no masses or thyromegaly. No significant cervical adenopathy. Lungs:  Clear throughout to auscultation.   No wheezes, crackles, or rhonchi. No acute distress. Heart:  Regular rate and rhythm; no murmurs, clicks, rubs,  or gallops. Abdomen: Non-distended, normal bowel sounds.  Soft and nontender without appreciable mass or hepatosplenomegaly.  Pulses:  Normal pulses noted. Extremities:  Without clubbing or edema.  Impression/Plan: Pleasant 56 year old gentleman with a history of coronary artery disease and  well-documented GERD with associated chest pain with recent flare in symptoms.  Cardiac work-up fortunately negative.  He has recently been put on an aggressive acid suppression regimen as well as a muscle relaxer.  Symptoms have subsided.  I do not really detect any lifestyle changes which would be associated with exacerbation in his reflux symptoms.  In fact, he has lost 10 pounds over the past few months.  No alarm symptoms; no need for endoscopic evaluation at this time.  He has had a component of chest pain over the years not felt to be cardiac in etiology ameliorated with low-dose amitriptyline.  He likes taking amitriptyline - he sleeps better with this medication as well.  Recommendations:  For now, continue Aciphex or rabeprazole 20 mg 30 minutes to 1 hour before breakfast and supper every day  May use famotidine or Pepcid on an as-needed basis  Leave off the cyclobenzaprine for now.  Continue amitriptyline 10 mg at bedtime  No need for any diagnostic studies at this time although plans could change depending on how he does over the coming months  Office visit with me in 3 months  If any interim problems , he please let me know  GERD information provided  Plan for a surveillance colonoscopy 2025.   Notice: This dictation was prepared with Dragon dictation along with smaller phrase technology. Any transcriptional errors that result from this process are unintentional and may not be corrected upon review.

## 2022-07-23 NOTE — Patient Instructions (Signed)
It was good to see you again today!  For now, continue Aciphex or rabeprazole 20 mg 30 minutes to 1 hour before breakfast and supper every day  May use famotidine or Pepcid on an as-needed basis  Leave off the cyclobenzaprine for now.  Continue amitriptyline 10 mg at bedtime  No need for any diagnostic studies at this time although plans could change depending on how you do over the coming months  Office visit with me in 3 months  If you have problems in the interim, please let me know  GERD information provided  Plan for a surveillance colonoscopy 2025.

## 2022-08-22 ENCOUNTER — Encounter (HOSPITAL_BASED_OUTPATIENT_CLINIC_OR_DEPARTMENT_OTHER): Payer: Self-pay

## 2022-08-22 ENCOUNTER — Ambulatory Visit (HOSPITAL_COMMUNITY)
Admission: RE | Admit: 2022-08-22 | Discharge: 2022-08-22 | Disposition: A | Discharge status: Home / Self Care | Payer: BC Managed Care – PPO | Source: Ambulatory Visit | Attending: Internal Medicine | Admitting: Internal Medicine

## 2022-08-22 ENCOUNTER — Ambulatory Visit (HOSPITAL_BASED_OUTPATIENT_CLINIC_OR_DEPARTMENT_OTHER)
Admission: RE | Admit: 2022-08-22 | Discharge: 2022-08-22 | Disposition: A | Discharge status: Home / Self Care | Payer: BC Managed Care – PPO | Source: Ambulatory Visit | Attending: Internal Medicine | Admitting: Internal Medicine

## 2022-08-22 ENCOUNTER — Other Ambulatory Visit (HOSPITAL_COMMUNITY): Payer: Self-pay | Admitting: Internal Medicine

## 2022-08-22 DIAGNOSIS — R519 Headache, unspecified: Secondary | ICD-10-CM | POA: Diagnosis not present

## 2022-08-27 ENCOUNTER — Ambulatory Visit
Admission: EM | Admit: 2022-08-27 | Discharge: 2022-08-27 | Disposition: A | Discharge status: Home / Self Care | Payer: BC Managed Care – PPO

## 2022-10-22 DIAGNOSIS — I10 Essential (primary) hypertension: Secondary | ICD-10-CM | POA: Diagnosis not present

## 2022-10-22 DIAGNOSIS — Z125 Encounter for screening for malignant neoplasm of prostate: Secondary | ICD-10-CM | POA: Diagnosis not present

## 2022-10-22 DIAGNOSIS — R7301 Impaired fasting glucose: Secondary | ICD-10-CM | POA: Diagnosis not present

## 2022-10-23 ENCOUNTER — Encounter: Payer: Self-pay | Admitting: Internal Medicine

## 2022-10-23 ENCOUNTER — Ambulatory Visit (INDEPENDENT_AMBULATORY_CARE_PROVIDER_SITE_OTHER): Payer: BC Managed Care – PPO | Admitting: Internal Medicine

## 2022-10-23 VITALS — BP 121/81 | HR 65 | Temp 97.3°F | Ht 71.0 in | Wt 210.8 lb

## 2022-10-23 DIAGNOSIS — K219 Gastro-esophageal reflux disease without esophagitis: Secondary | ICD-10-CM

## 2022-10-23 DIAGNOSIS — Z86010 Personal history of colonic polyps: Secondary | ICD-10-CM | POA: Diagnosis not present

## 2022-10-23 NOTE — Patient Instructions (Signed)
It was good to see you again today!  GERD information provided  As discussed, might try dropping back to rabeprazole 20 mg daily 30 minutes before breakfast once daily.  If you have only occasional breakthrough symptoms you can try Pepcid Complete or Pepcid AC.  If you drop back to once daily and you are having reflux symptoms 3-4 times a week, increase rabeprazole back to twice daily.  Please let me know if this occurs.  Discussed the risk and benefits of long-term acid suppression therapy.  In your situation, the benefits far outweigh the risks.  Plan to see you back in April 2025 and as needed.

## 2022-10-23 NOTE — Progress Notes (Signed)
Primary Care Physician:  Ginger Organ., MD Primary Gastroenterologist:  Dr. Gala Romney  Pre-Procedure History & Physical: HPI:  Eric Fisher. is a 57 y.o. male here for follow-up of GERD.  Historically has been on rabeprazole 20 mg twice daily.  No reflux symptoms no dysphagia.  No chest pain.  Overall doing well  - no bowel symptoms; 2 colonic adenomas removed thousand 20; due for surveillance colonoscopy next year.  Past Medical History:  Diagnosis Date   Anxiety    Complication of anesthesia    Coronary artery disease    ectatic coronaries per cath in August 2013 - managed with Brilinta and aspirin   Gallbladder polyp 05/2011   on abd u/s   Gallstones 05/2011   on abd u/s   GERD (gastroesophageal reflux disease)    Hemorrhoid    s/p surgery August 2013   Hypertension    he denies   MI, acute, non ST segment elevation ([place]) 03/2011   thrombus of LAD   PONV (postoperative nausea and vomiting)     Past Surgical History:  Procedure Laterality Date   BRAVO [place] STUDY  02/2008   Day 1, 155 episodes of acid reflux with longest episode 29 minutes, [place]ter 41.7. Day 2, 51 episodes, [place] 12.4. Two of three episodes of chest pain correlated with episode of acid reflux. Study done OFF PPI.   CARDIAC CATHETERIZATION  03/11/2011   This demonstrated marked ectasia of the proximal LAD with suggestion of a large filling defect consistent  with thrombus.  There was clot extending into the first septal   perforating branch which had slow flow.    CARDIAC CATHETERIZATION  05/19/2012   Angiographic data- large and slightly ectatic left main, no stenosis or thrombi, pLAD extremely large and ectatic, no thombi, remained LAD normal; normal LCx without stenosis or ectasia; minor luminal RCA irregularities, dRCA mildly ectatic; PDA and PLSA normal size.    CHOLECYSTECTOMY N/A 04/17/2013   Procedure: LAPAROSCOPIC CHOLECYSTECTOMY;  Surgeon: Jamesetta So, MD;  Location: [facility];  Service:  General;  Laterality: N/A;   COLONOSCOPY N/A 05/15/2019   Procedure: COLONOSCOPY;  Surgeon: Daneil Dolin, MD;  Location: [facility];  Service: Endoscopy;  Laterality: N/A;  2:15pm   ESOPHAGOGASTRODUODENOSCOPY  02/2008   small hh   ESOPHAGOGASTRODUODENOSCOPY  06/19/2011   Dr. Vivi Ferns esophagus, small hiatal hernia o/w normal stomach   HEMORRHOID SURGERY  05/23/2012   Procedure: HEMORRHOIDECTOMY;  Surgeon: Jamesetta So, MD;  Location: [facility];  Service: General;  Laterality: N/A;   HEMORROIDECTOMY     KNEE ARTHROSCOPY     LEFT HEART CATHETERIZATION WITH CORONARY ANGIOGRAM N/A 05/19/2012   Procedure: LEFT HEART CATHETERIZATION WITH CORONARY ANGIOGRAM;  Surgeon: Thayer Headings, MD;  Location: [facility] CATH LAB;  Service: Cardiovascular;  Laterality: N/A;   POLYPECTOMY  05/15/2019   Procedure: POLYPECTOMY;  Surgeon: Daneil Dolin, MD;  [facility]: [facility];  Service: Endoscopy;;  splenic flex and hepatic flex   TRANSTHORACIC ECHOCARDIOGRAM  03/13/2011   Left ventricle: Mild distal septal hypokinesis The cavity size was normal. Systolic function was normal. The estimated ejection fraction was 55%. Wall motion was normal    Prior to Admission medications   Medication Sig Start Date End Date Taking? Authorizing Provider  acetaminophen (TYLENOL) 500 MG tablet Take 1,000 mg by mouth daily as needed for moderate pain.    Yes [provider]  ALPRAZolam Duanne Moron) 0.5 MG tablet Take 0.5 mg by  mouth 2 (two) times daily as needed for anxiety.  05/03/11  Yes [provider]  amitriptyline (ELAVIL) 10 MG tablet Take 1 tablet (10 mg total) by mouth at bedtime. 11/08/21  Yes Annitta Needs, NP  apixaban (ELIQUIS) 5 MG TABS tablet Take 5 mg by mouth 2 (two) times daily.   Yes [provider]  aspirin 81 MG tablet Take 81 mg by mouth every evening.    Yes [provider]  atorvastatin (LIPITOR) 40 MG tablet TAKE 1 TABLET BY MOUTH DAILY 04/24/22  Yes Nahser, Wonda Cheng, MD   cyclobenzaprine (FLEXERIL) 10 MG tablet SMARTSIG:0.5-1 Tablet(s) By Mouth 07/03/22  Yes [provider]  famotidine (PEPCID) 20 MG tablet Take 20 mg by mouth 2 (two) times daily.   Yes [provider]  fluticasone (FLONASE) 50 MCG/ACT nasal spray Place 1 spray into both nostrils daily.   Yes [provider]  hydrochlorothiazide (HYDRODIURIL) 25 MG tablet TAKE 1 TABLET BY MOUTH DAILY 04/24/22  Yes Nahser, Wonda Cheng, MD  metoprolol tartrate (LOPRESSOR) 25 MG tablet TAKE 1 TABLET BY MOUTH TWICE  DAILY 04/09/22  Yes Nahser, Wonda Cheng, MD  Multiple Vitamin (MULTIVITAMIN WITH MINERALS) TABS Take 1 tablet by mouth 2 (two) times daily.    Yes [provider]  nitroGLYCERIN (NITROSTAT) 0.4 MG SL tablet Place 1 tablet (0.4 mg total) under the tongue every 5 (five) minutes x 3 doses as needed. Chest pain 10/17/17  Yes Nahser, Wonda Cheng, MD  potassium chloride (KLOR-CON M) 10 MEQ tablet TAKE 1 TABLET BY MOUTH DAILY Patient taking differently: 20 mEq daily. 04/24/22  Yes Nahser, Wonda Cheng, MD  RABEprazole (ACIPHEX) 20 MG tablet TAKE 1 TABLET BY MOUTH  DAILY Patient taking differently: Take 20 mg by mouth 2 (two) times daily. TAKE 1 TABLET BY MOUTH  DAILY 04/07/21  Yes Mahala Menghini, PA-C  traMADol (ULTRAM) 50 MG tablet Take 50 mg by mouth 3 (three) times daily as needed. 08/23/22  Yes [provider]    Allergies as of 10/23/2022   (No Known Allergies)    Family History  Problem Relation Age of Onset   Arrhythmia Father        a. flutter   GER disease Father    Heart Problems Father        nonischemic cardiomyopathy, ASCVD   Colon cancer Neg Hx     Social History   Socioeconomic History   Marital status: Single    Spouse name: Not on file   Number of children: 0   Years of education: Not on file   Highest education level: Not on file  Occupational History   Occupation: maintenance    Employer: [employer]  Tobacco Use   Smoking status: Never   Smokeless  tobacco: Never  Vaping Use   Vaping Use: Never used  Substance and Sexual Activity   Alcohol use: No   Drug use: No   Sexual activity: Not Currently  Other Topics Concern   Not on file  Social History Narrative   Not on file   Social Determinants of Health   Financial Resource Strain: Not on file  Food Insecurity: Not on file  Transportation Needs: Not on file  Physical Activity: Not on file  Stress: Not on file  Social Connections: Not on file  Intimate Partner Violence: Not on file    Review of Systems: See HPI, otherwise negative ROS  Physical Exam: BP 121/81 (BP Location: Right Arm, Patient Position: Sitting,  Cuff Size: Large)   Pulse 65   Temp (!) 97.3 F (36.3 C) (Oral)   Ht '5\' 11"'$  (1.803 m)   Wt 210 lb 12.8 oz (95.6 kg)   SpO2 98%   BMI 29.40 kg/m  General:   Alert,  Well-developed, well-nourished, pleasant and cooperative in NAD  Impression/Plan: 57 year old gentleman with longstanding GERD.  History of Kawasaki's disease.  History colonic adenoma  He returns doing very well on twice daily Aciphex.  He has no GI symptoms.  History of colonic adenoma; due for surveillance examination 2025.  Recommendations:  GERD information provided  As discussed, might try dropping back to rabeprazole 20 mg daily 30 minutes before breakfast once daily.  If you have only occasional breakthrough symptoms you can try Pepcid Complete or Pepcid AC.  If you drop back to once daily and you are having reflux symptoms 3-4 times a week, increase rabeprazole back to twice daily.  Please let me know if this occurs.  Discussed the risk and benefits of long-term acid suppression therapy.  In your situation, the benefits far outweigh the risks.  Plan to see you back April 2025 and as needed.      Notice: This dictation was prepared with Dragon dictation along with smaller phrase technology. Any transcriptional errors that result from this process are unintentional and may not be  corrected upon review.

## 2022-10-29 DIAGNOSIS — Z23 Encounter for immunization: Secondary | ICD-10-CM | POA: Diagnosis not present

## 2022-10-29 DIAGNOSIS — I10 Essential (primary) hypertension: Secondary | ICD-10-CM | POA: Diagnosis not present

## 2022-10-29 DIAGNOSIS — Z1339 Encounter for screening examination for other mental health and behavioral disorders: Secondary | ICD-10-CM | POA: Diagnosis not present

## 2022-10-29 DIAGNOSIS — Z0000 Encounter for general adult medical examination without abnormal findings: Secondary | ICD-10-CM | POA: Diagnosis not present

## 2022-10-29 DIAGNOSIS — D6861 Antiphospholipid syndrome: Secondary | ICD-10-CM | POA: Diagnosis not present

## 2022-10-29 DIAGNOSIS — R82998 Other abnormal findings in urine: Secondary | ICD-10-CM | POA: Diagnosis not present

## 2022-10-29 DIAGNOSIS — Z1331 Encounter for screening for depression: Secondary | ICD-10-CM | POA: Diagnosis not present

## 2022-11-15 ENCOUNTER — Other Ambulatory Visit: Payer: Self-pay | Admitting: Gastroenterology

## 2022-11-15 ENCOUNTER — Other Ambulatory Visit: Payer: Self-pay | Admitting: Internal Medicine

## 2022-12-24 NOTE — Progress Notes (Signed)
Cardiology Office Note   Date:  12/25/2022   ID:  Eric Setting., DOB 08/25/1966, MRN [id number]  PCP:  Ginger Organ., MD  Cardiologist:   Mertie Moores, MD   No chief complaint on file.  1. 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 Hyperlipidemia   History of Present Illness: Eric Fisher is a 57 year old gentleman with a history of coronary artery disease. 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 We treated with Brilinta, aspirin, and Coumadin. He's had some atypical episodes of chest pain since last saw him. None of his symptoms were similar to his presenting episodes of angina. He exercises on a daily basis and does not have any episodes of chest pain.  He has been maintained on Brilinta and Coumadin he seems to be doing fairly well. He is quite active and has not had any recurrent episodes of angina.  Dec. 9, 2013 When I last saw him several months ago we discontinued his Coumadin. We have maintained the Brilinta and ASA 81 QD. He has been able to all of his normal activities without any chest pain.  March 16, 2013:  He denies any chest pain.  He is getting some regular exercise.  March 18, 2014:  Eric Fisher is doing very well. He's not had any episodes of chest pain or shortness breath. He has remained very active.  He's been very busy at work. He's looking forward to getting back into an exercise regimen this summer.   April 04, 2015:   Eric Vanslooten Paulie Astin. is a 57 y.o. male who presents for follow up of his CAD Doing well.  No CP .    April 03, 2016:  Eric Fisher is doing well.   Has had dx of CAD for 5 years.   Kawasaki's disease  Exercising some .   Putting in long hours at work .    Aug. 23, 2018  Eric Fisher is doing well. He has a history of coronary artery disease-Kawasaki's disease. He has been maintained on low-dose Brilinta and is doing quite well. 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   September night, 2019: Eric Fisher is seen today for follow-up of his coronary artery disease ( Kawasaki's disease) and DVT.  He was found to have a DVT on January 24, 2018: The Brilinta was stopped and he was started on Eliquis 5 mg twice a day.  It sounds like he had clotting factors measured by Dr. Brigitte Pulse.  I do not have the results but it sounds like he did have an abnormality which would cause him to be hypercoagulable.  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  Blood Pressure is  elevated today.   March 16, 2020:  Eric Fisher is seen today for follow-up visit of his coronary artery disease and hypercoagulable state.  He also has a history of hypertension and hyperlipidemia.  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  He has a history of a DVT.  He is now on Eliquis 5 mg twice a day. Diastolic BP is a little elevated .   Recheck bp is fine   March 24, 2021: Eric Fisher is seen today for follow up of his CAD and hypercoaguable state. Hx of DVT  Doing well from a cardiac standpoint.    April 16, 2022 Eric Fisher is seen today for follow up of his CAD, hypercoagulable  state. Hx of DVT No cp  Looking forward to getting more exercise this summer    Lipids from Dr. Raul Del office shows Total cholesterol is 121 HDL is 32 LDL is 74 Triglyceride level is 72 Hemoglobin A1c is 5.1 Creatinine is 0.87.   Potassium is 4.5.   June 25, 2022: Eric Fisher is seen today for follow-up of his coronary artery disease and hypercoagulable state.  6 days ago  He recently presented to the Urgent Care with a 3-day history of chills, hot flashes, headache, generalized body aches and fatigue.    Troponins were negative x2. Viral serologies were negative for influenza A, influenza B, coronavirus   3 days ago, he some burning in his chest , Not constant Would could and go BP was ok.   HR was 156.  His heart rate felt  irregular and fast.  He specifically did not note any irregularity to his heart rate. Had mowed the yard using a riding lawn more.  He did not specifically feel dehydrated.  Went back to the ER  No troponins were checked during this ER visit.  Walks on occasion .  No CP with waking .   The ER doctor thought it might be reflux  December 25, 2022: Eric Fisher is seen today for follow-up of his coronary artery disease.  He has had some episodes of tachycardia that we question whether or not were due to atrial fibrillation.  No CP .   No dyspnea    Past Medical History:  Diagnosis Date   Anxiety    Complication of anesthesia    Coronary artery disease    ectatic coronaries per cath in August 2013 - managed with Brilinta and aspirin   Gallbladder polyp 05/2011   on abd u/s   Gallstones 05/2011   on abd u/s   GERD (gastroesophageal reflux disease)    Hemorrhoid    s/p surgery August 2013   Hypertension    he denies   MI, acute, non ST segment elevation ([place]) 03/2011   thrombus of LAD   PONV (postoperative nausea and vomiting)     Past Surgical History:  Procedure Laterality Date   BRAVO [place] STUDY  02/2008   Day 1, 155 episodes of acid reflux with longest episode 29 minutes, [place]ter 41.7. Day 2, 51 episodes, [place] 12.4. Two of three episodes of chest pain correlated with episode of acid reflux. Study done OFF PPI.   CARDIAC CATHETERIZATION  03/11/2011   This demonstrated marked ectasia of the proximal LAD with suggestion of a large filling defect consistent  with thrombus.  There was clot extending into the first septal   perforating branch which had slow flow.    CARDIAC CATHETERIZATION  05/19/2012   Angiographic data- large and slightly ectatic left main, no stenosis or thrombi, pLAD extremely large and ectatic, no thombi, remained LAD normal; normal LCx without stenosis or ectasia; minor luminal RCA irregularities, dRCA mildly ectatic; PDA and PLSA normal size.    CHOLECYSTECTOMY N/A  04/17/2013   Procedure: LAPAROSCOPIC CHOLECYSTECTOMY;  Surgeon: Jamesetta So, MD;  Location: [facility];  Service: General;  Laterality: N/A;   COLONOSCOPY N/A 05/15/2019   Procedure: COLONOSCOPY;  Surgeon: Daneil Dolin, MD;  [facility];  Service: Endoscopy;  Laterality: N/A;  2:15pm   ESOPHAGOGASTRODUODENOSCOPY  02/2008   small hh   ESOPHAGOGASTRODUODENOSCOPY  06/19/2011   Dr. Vivi Ferns esophagus, small hiatal hernia o/w normal stomach   HEMORRHOID SURGERY  05/23/2012   Procedure: HEMORRHOIDECTOMY;  Surgeon: Jamesetta So, MD;  Location: [facility];  Service: General;  Laterality: N/A;   HEMORROIDECTOMY     KNEE ARTHROSCOPY     LEFT HEART CATHETERIZATION WITH CORONARY ANGIOGRAM N/A 05/19/2012   Procedure: LEFT HEART CATHETERIZATION WITH CORONARY ANGIOGRAM;  Surgeon: Thayer Headings, MD;  Location: [facility] CATH LAB;  Service: Cardiovascular;  Laterality: N/A;   POLYPECTOMY  05/15/2019   Procedure: POLYPECTOMY;  Surgeon: Daneil Dolin, MD;  [facility];  Service: Endoscopy;;  splenic flex and hepatic flex   TRANSTHORACIC ECHOCARDIOGRAM  03/13/2011   Left ventricle: Mild distal septal hypokinesis The cavity size was normal. Systolic function was normal. The estimated ejection fraction was 55%. Wall motion was normal     Current Outpatient Medications  Medication Sig Dispense Refill   acetaminophen (TYLENOL) 500 MG tablet Take 1,000 mg by mouth daily as needed for moderate pain.      ALPRAZolam (XANAX) 0.5 MG tablet Take 0.5 mg by mouth 2 (two) times daily as needed for anxiety.      amitriptyline (ELAVIL) 10 MG tablet TAKE 1 TABLET BY MOUTH AT  BEDTIME 90 tablet 3   apixaban (ELIQUIS) 5 MG TABS tablet Take 5 mg by mouth 2 (two) times daily.     aspirin 81 MG tablet Take 81 mg by mouth every evening.      atorvastatin (LIPITOR) 40 MG tablet TAKE 1 TABLET BY MOUTH DAILY 90 tablet 3   cyclobenzaprine (FLEXERIL) 10 MG tablet SMARTSIG:0.5-1 Tablet(s) By Mouth     famotidine  (PEPCID) 20 MG tablet Take 20 mg by mouth 2 (two) times daily.     fluticasone (FLONASE) 50 MCG/ACT nasal spray Place 1 spray into both nostrils daily.     hydrochlorothiazide (HYDRODIURIL) 25 MG tablet TAKE 1 TABLET BY MOUTH DAILY 90 tablet 3   metoprolol tartrate (LOPRESSOR) 25 MG tablet TAKE 1 TABLET BY MOUTH TWICE  DAILY 30 tablet 0   Multiple Vitamin (MULTIVITAMIN WITH MINERALS) TABS Take 1 tablet by mouth 2 (two) times daily.      nitroGLYCERIN (NITROSTAT) 0.4 MG SL tablet Place 1 tablet (0.4 mg total) under the tongue every 5 (five) minutes x 3 doses as needed. Chest pain 25 tablet 3   potassium chloride (KLOR-CON M) 10 MEQ tablet TAKE 1 TABLET BY MOUTH DAILY (Patient taking differently: 20 mEq daily.) 90 tablet 3   RABEprazole (ACIPHEX) 20 MG tablet TAKE 1 TABLET BY MOUTH DAILY 90 tablet 3   traMADol (ULTRAM) 50 MG tablet Take 50 mg by mouth 3 (three) times daily as needed.     No current facility-administered medications for this visit.    Allergies:   Patient has no known allergies.    Social History:  The patient  reports that he has never smoked. He has never used smokeless tobacco. He reports that he does not drink alcohol and does not use drugs.   Family History:  The patient's family history includes Arrhythmia in his father; GER disease in his father; Heart Problems in his father.    ROS:   Noted in current history, otherwise review of systems is negative.   Physical Exam: Blood pressure 120/84, pulse 78, height 5\' 11"  (1.803 m), weight 214 lb 9.6 oz (97.3 kg), SpO2 98 %.       GEN:  Well nourished, well developed in no acute distress HEENT: Normal NECK: No JVD; No carotid bruits LYMPHATICS: No lymphadenopathy CARDIAC: RRR , no murmurs, rubs, gallops RESPIRATORY:  Clear to  auscultation without rales, wheezing or rhonchi  ABDOMEN: Soft, non-tender, non-distended MUSCULOSKELETAL:  No edema; No deformity  SKIN: Warm and dry NEUROLOGIC:  Alert and oriented x  3     EKG:        Recent Labs: 04/16/2022: ALT 35 06/22/2022: BUN 8; Creatinine, Ser 0.95; Hemoglobin 14.6; Platelets 199; Potassium 3.7; Sodium 135    Lipid Panel    Component Value Date/Time   CHOL 106 04/16/2022 1119   CHOL 90 03/15/2014 0743   TRIG 66 04/16/2022 1119   TRIG 86 03/15/2014 0743   HDL 44 04/16/2022 1119   HDL 37 (L) 03/15/2014 0743   CHOLHDL 2.4 04/16/2022 1119   CHOLHDL 2.4 04/03/2016 0812   VLDL 14 04/03/2016 0812   LDLCALC 48 04/16/2022 1119   LDLCALC 36 03/15/2014 0743      Wt Readings from Last 3 Encounters:  12/25/22 214 lb 9.6 oz (97.3 kg)  10/23/22 210 lb 12.8 oz (95.6 kg)  07/23/22 209 lb 9.6 oz (95.1 kg)      Other studies Reviewed: Additional studies/ records that were reviewed today include: . Review of the above records demonstrates:    ASSESSMENT AND PLAN:  1. Coronary artery disease-  .    Denies having any episodes of chest pain.  He has severely dilated coronary arteries similar to Kawasaki disease.  He has had thrombosis of his coronary arteries.  This was all due to a hypercoagulable state that was not yet diagnosed.  He has not had any episodes of angina.       2.   hypercoagulable state:  Mar[place] Kitchen    Remains on Eliquis.  He has a history of DVT in the past.  He also had intense thrombosis of his coronary arteries.  He remains on Eliquis.  Will check a CBC today.  2. Hyperlipidemia  -       we will check lipids, ALT, basic metabolic profile.  4.  HTN:        5.  Tachycardia: Has not had any further episodes of tachycardia.    Current medicines are reviewed at length with the patient today.  The patient does not have concerns regarding medicines.  The following changes have been made:  no change  Labs/ tests ordered today include:   Orders Placed This Encounter  Procedures   Lipid panel   ALT   Basic metabolic panel   CBC      Disposition:   FU with me in 1 year      Mertie Moores, MD  12/25/2022 8:59 AM     [address], [place], [place]  [address] Phone: [phone]; Fax: [phone]

## 2022-12-25 ENCOUNTER — Ambulatory Visit: Payer: BC Managed Care – PPO | Attending: Cardiovascular Disease | Admitting: Cardiovascular Disease

## 2022-12-25 ENCOUNTER — Encounter: Payer: Self-pay | Admitting: Cardiovascular Disease

## 2022-12-25 VITALS — BP 120/84 | HR 78 | Ht 71.0 in | Wt 214.6 lb

## 2022-12-25 DIAGNOSIS — I2510 Atherosclerotic heart disease of native coronary artery without angina pectoris: Secondary | ICD-10-CM

## 2022-12-25 DIAGNOSIS — I824Z1 Acute embolism and thrombosis of unspecified deep veins of right distal lower extremity: Secondary | ICD-10-CM

## 2022-12-25 NOTE — Patient Instructions (Signed)
Medication Instructions:  Your physician recommends that you continue on your current medications as directed. Please refer to the Current Medication list given to you today.  *If you need a refill on your cardiac medications before your next appointment, please call your pharmacy*   Lab Work: Lipids, ALT, BMET, CBC today If you have labs (blood work) drawn today and your tests are completely normal, you will receive your results only by: [place] (if you have MyChart) OR A paper copy in the mail If you have any lab test that is abnormal or we need to change your treatment, we will call you to review the results.   Testing/Procedures: NONE   Follow-Up: At [facility], you and your health needs are our priority.  As part of our continuing mission to provide you with exceptional heart care, we have created designated Provider Care Teams.  These Care Teams include your primary Cardiologist (physician) and Advanced Practice Providers (APPs -  Physician Assistants and Nurse Practitioners) who all work together to provide you with the care you need, when you need it.  We recommend signing up for the patient portal called "MyChart".  Sign up information is provided on this After Visit Summary.  MyChart is used to connect with patients for Virtual Visits (Telemedicine).  Patients are able to view lab/test results, encounter notes, upcoming appointments, etc.  Non-urgent messages can be sent to your provider as well.   To learn more about what you can do with MyChart, go to [email or web address].    Your next appointment:   1 year(s)  Provider:   Mertie Moores, MD

## 2022-12-26 LAB — BASIC METABOLIC PANEL
BUN/Creatinine Ratio: 14 (ref 9–20)
BUN: 11 mg/dL (ref 6–24)
CO2: 25 mmol/L (ref 20–29)
Calcium: 9.7 mg/dL (ref 8.7–10.2)
Chloride: 101 mmol/L (ref 96–106)
Creatinine, Ser: 0.8 mg/dL (ref 0.76–1.27)
Glucose: 97 mg/dL (ref 70–99)
Potassium: 4.5 mmol/L (ref 3.5–5.2)
Sodium: 140 mmol/L (ref 134–144)
eGFR: 105 mL/min/{1.73_m2} (ref >59)

## 2022-12-26 LAB — CBC
Hematocrit: 43 % (ref 37.5–51.0)
Hemoglobin: 14.5 g/dL (ref 13.0–17.7)
MCH: 30.3 pg (ref 26.6–33.0)
MCHC: 33.7 g/dL (ref 31.5–35.7)
MCV: 90 fL (ref 79–97)
Platelets: 212 10*3/uL (ref 150–450)
RBC: 4.79 x10E6/uL (ref 4.14–5.80)
RDW: 12.7 % (ref 11.6–15.4)
WBC: 6.2 10*3/uL (ref 3.4–10.8)

## 2022-12-26 LAB — LIPID PANEL
Chol/HDL Ratio: 2.6 ratio (ref 0.0–5.0)
Cholesterol, Total: 127 mg/dL (ref 100–199)
HDL: 49 mg/dL (ref >39)
LDL Chol Calc (NIH): 61 mg/dL (ref 0–99)
Triglycerides: 86 mg/dL (ref 0–149)
VLDL Cholesterol Cal: 17 mg/dL (ref 5–40)

## 2022-12-26 LAB — ALT: ALT: 29 IU/L (ref 0–44)

## 2023-03-26 ENCOUNTER — Other Ambulatory Visit: Payer: Self-pay | Admitting: Cardiovascular Disease

## 2023-05-20 DIAGNOSIS — U071 COVID-19: Secondary | ICD-10-CM | POA: Diagnosis not present

## 2023-05-20 DIAGNOSIS — R051 Acute cough: Secondary | ICD-10-CM | POA: Diagnosis not present

## 2023-07-15 DIAGNOSIS — H40013 Open angle with borderline findings, low risk, bilateral: Secondary | ICD-10-CM | POA: Diagnosis not present

## 2023-07-15 DIAGNOSIS — H04123 Dry eye syndrome of bilateral lacrimal glands: Secondary | ICD-10-CM | POA: Diagnosis not present

## 2023-07-15 DIAGNOSIS — H35371 Puckering of macula, right eye: Secondary | ICD-10-CM | POA: Diagnosis not present

## 2023-07-15 DIAGNOSIS — H25813 Combined forms of age-related cataract, bilateral: Secondary | ICD-10-CM | POA: Diagnosis not present

## 2023-10-01 ENCOUNTER — Other Ambulatory Visit: Payer: Self-pay | Admitting: Internal Medicine

## 2023-10-28 ENCOUNTER — Other Ambulatory Visit: Payer: Self-pay | Admitting: Gastroenterology

## 2023-11-08 DIAGNOSIS — E785 Hyperlipidemia, unspecified: Secondary | ICD-10-CM | POA: Diagnosis not present

## 2023-11-08 DIAGNOSIS — Z125 Encounter for screening for malignant neoplasm of prostate: Secondary | ICD-10-CM | POA: Diagnosis not present

## 2023-11-08 DIAGNOSIS — R7301 Impaired fasting glucose: Secondary | ICD-10-CM | POA: Diagnosis not present

## 2023-11-10 ENCOUNTER — Other Ambulatory Visit: Payer: Self-pay | Admitting: Cardiovascular Disease

## 2023-11-15 DIAGNOSIS — Z1331 Encounter for screening for depression: Secondary | ICD-10-CM | POA: Diagnosis not present

## 2023-11-15 DIAGNOSIS — R82998 Other abnormal findings in urine: Secondary | ICD-10-CM | POA: Diagnosis not present

## 2023-11-15 DIAGNOSIS — Z0000 Encounter for general adult medical examination without abnormal findings: Secondary | ICD-10-CM | POA: Diagnosis not present

## 2023-11-15 DIAGNOSIS — Z1339 Encounter for screening examination for other mental health and behavioral disorders: Secondary | ICD-10-CM | POA: Diagnosis not present

## 2023-11-15 DIAGNOSIS — I10 Essential (primary) hypertension: Secondary | ICD-10-CM | POA: Diagnosis not present

## 2023-11-15 DIAGNOSIS — Z23 Encounter for immunization: Secondary | ICD-10-CM | POA: Diagnosis not present

## 2023-12-26 ENCOUNTER — Encounter: Payer: Self-pay | Admitting: Cardiovascular Disease

## 2023-12-26 NOTE — Progress Notes (Unsigned)
 Cardiology Office Note   Date:  12/27/2023   ID:  Eric Schulke., DOB 1966-01-11, MRN [id number]  PCP:  Cleatis Polka., MD  Cardiologist:   Kristeen Miss, MD   Chief Complaint  Patient presents with   Coronary Artery Disease        1. 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 Hyperlipidemia   History of Present Illness: Eric Fisher is a 58 year old gentleman with a history of coronary artery disease. 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 We treated with Brilinta, aspirin, and Coumadin. He's had some atypical episodes of chest pain since last saw him. None of his symptoms were similar to his presenting episodes of angina. He exercises on a daily basis and does not have any episodes of chest pain.  He has been maintained on Brilinta and Coumadin he seems to be doing fairly well. He is quite active and has not had any recurrent episodes of angina.  Dec. 9, 2013 When I last saw him several months ago we discontinued his Coumadin. We have maintained the Brilinta and ASA 81 QD. He has been able to all of his normal activities without any chest pain.  March 16, 2013:  He denies any chest pain.  He is getting some regular exercise.  March 18, 2014:  Eric Fisher is doing very well. He's not had any episodes of chest pain or shortness breath. He has remained very active.  He's been very busy at work. He's looking forward to getting back into an exercise regimen this summer.   April 04, 2015:   Eric Broady Cordai Rodrigue. is a 58 y.o. male who presents for follow up of his CAD Doing well.  No CP .    April 03, 2016:  Eric Fisher is doing well.   Has had dx of CAD for 5 years.   Kawasaki's disease  Exercising some .   Putting in long hours at work .    Aug. 23, 2018  Eric Fisher is doing well. He has a history of coronary artery disease-Kawasaki's disease. He has been maintained on low-dose Brilinta and is  doing quite well. 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   September night, 2019: Eric Fisher is seen today for follow-up of his coronary artery disease ( Kawasaki's disease) and DVT.  He was found to have a DVT on January 24, 2018: The Brilinta was stopped and he was started on Eliquis 5 mg twice a day.  It sounds like he had clotting factors measured by Dr. Clelia Croft.  I do not have the results but it sounds like he did have an abnormality which would cause him to be hypercoagulable.  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  Blood Pressure is  elevated today.   March 16, 2020:  Eric Fisher is seen today for follow-up visit of his coronary artery disease and hypercoagulable state.  He also has a history of hypertension and hyperlipidemia.  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  He has a history of a DVT.  He is now on Eliquis 5 mg twice a day. Diastolic BP is a little elevated .   Recheck bp is fine   March 24, 2021: Eric Fisher is seen today for follow up of his CAD and hypercoaguable state. Hx of DVT  Doing well from a cardiac standpoint.    April 16, 2022 Eric Fisher is seen today for follow up of his CAD, hypercoagulable state. Hx of DVT No cp  Looking forward to getting more exercise this summer    Lipids from Dr. Alver Fisher office shows Total cholesterol is 121 HDL is 32 LDL is 74 Triglyceride level is 72 Hemoglobin A1c is 5.1 Creatinine is 0.87.   Potassium is 4.5.   June 25, 2022: Eric Fisher is seen today for follow-up of his coronary artery disease and hypercoagulable state.  6 days ago  He recently presented to the Urgent Care with a 3-day history of chills, hot flashes, headache, generalized body aches and fatigue.    Troponins were negative x2. Viral serologies were negative for influenza A, influenza B, coronavirus   3 days ago, he some burning in his chest , Not constant Would could and  go BP was ok.   HR was 156.  His heart rate felt irregular and fast.  He specifically did not note any irregularity to his heart rate. Had mowed the yard using a riding lawn more.  He did not specifically feel dehydrated.  Went back to the ER  No troponins were checked during this ER visit.  Walks on occasion .  No CP with waking .   The ER doctor thought it might be reflux  December 25, 2022: Eric Fisher is seen today for follow-up of his coronary artery disease.  He has had some episodes of tachycardia that we question whether or not were due to atrial fibrillation.  No CP .   No dyspnea   December 27, 2023 Eric Fisher is seen for follow up of his CAD, hypercoagulability   No CP  Exercising some - as much as he can  Labs from November 08, 2023 reveal Total cholesterol is 130 HDL is 43 LDL is 68 Triglyceride level is 97 Hemoglobin A1c is 5.3 Potassium is 4.5 Creatinine 0.8      Past Medical History:  Diagnosis Date   Anxiety    Complication of anesthesia    Coronary artery disease    ectatic coronaries per cath in August 2013 - managed with Brilinta and aspirin   Gallbladder polyp 05/2011   on abd u/s   Gallstones 05/2011   on abd u/s   GERD (gastroesophageal reflux disease)    Hemorrhoid    s/p surgery August 2013   Hypertension    he denies   MI, acute, non ST segment elevation (HCC) 03/2011   thrombus of LAD   PONV (postoperative nausea and vomiting)     Past Surgical History:  Procedure Laterality Date   BRAVO PH STUDY  02/2008   Day 1, 155 episodes of acid reflux with longest episode 29 minutes, DeMeester 41.7. Day 2, 51 episodes, [place]ter 12.4. Two of three episodes of chest pain correlated with episode of acid reflux. Study done OFF PPI.   CARDIAC CATHETERIZATION  03/11/2011   This demonstrated marked ectasia of the proximal LAD with suggestion of a large filling defect consistent  with thrombus.  There was clot extending into the first septal   perforating branch which  had slow flow.    CARDIAC CATHETERIZATION  05/19/2012   Angiographic data- large and slightly ectatic left main, no stenosis or thrombi, pLAD extremely large and ectatic, no thombi, remained LAD normal; normal LCx without stenosis or ectasia; minor luminal RCA irregularities, dRCA mildly ectatic; PDA and PLSA normal size.    CHOLECYSTECTOMY N/A 04/17/2013   Procedure: LAPAROSCOPIC CHOLECYSTECTOMY;  Surgeon: Dalia Heading, MD;  Location: [facility];  Service: General;  Laterality: N/A;   COLONOSCOPY N/A 05/15/2019   Procedure: COLONOSCOPY;  Surgeon: Corbin Ade, MD;  Location: [facility];  Service: Endoscopy;  Laterality: N/A;  2:15pm   ESOPHAGOGASTRODUODENOSCOPY  02/2008   small hh   ESOPHAGOGASTRODUODENOSCOPY  06/19/2011   Dr. Elmer Ramp esophagus, small hiatal hernia o/w normal stomach   HEMORRHOID SURGERY  05/23/2012   Procedure: HEMORRHOIDECTOMY;  Surgeon: Dalia Heading, MD;  Location: [facility];  Service: General;  Laterality: N/A;   HEMORROIDECTOMY     KNEE ARTHROSCOPY     LEFT HEART CATHETERIZATION WITH CORONARY ANGIOGRAM N/A 05/19/2012   Procedure: LEFT HEART CATHETERIZATION WITH CORONARY ANGIOGRAM;  Surgeon: [name], MD;  Location[facility] CATH LAB;  Service: Cardiovascular;  Laterality: N/A;   POLYPECTOMY  05/15/2019   Procedure: POLYPECTOMY;  Surgeon: Corbin Ade, MD;  Location: [facility];  Service: Endoscopy;;  splenic flex and hepatic flex   TRANSTHORACIC ECHOCARDIOGRAM  03/13/2011   Left ventricle: Mild distal septal hypokinesis The cavity size was normal. Systolic function was normal. The estimated ejection fraction was 55%. Wall motion was normal     Current Outpatient Medications  Medication Sig Dispense Refill   acetaminophen (TYLENOL) 500 MG tablet Take 1,000 mg by mouth daily as needed for moderate pain.      ALPRAZolam (XANAX) 0.5 MG tablet Take 0.5 mg by mouth 2 (two) times daily as needed for anxiety.      amitriptyline (ELAVIL) 10 MG tablet TAKE 1 TABLET BY  MOUTH AT  BEDTIME 90 tablet 3   apixaban (ELIQUIS) 5 MG TABS tablet Take 5 mg by mouth 2 (two) times daily.     aspirin 81 MG tablet Take 81 mg by mouth every evening.      atorvastatin (LIPITOR) 40 MG tablet TAKE 1 TABLET BY MOUTH DAILY 90 tablet 0   cyclobenzaprine (FLEXERIL) 10 MG tablet SMARTSIG:0.5-1 Tablet(s) By Mouth     famotidine (PEPCID) 20 MG tablet Take 20 mg by mouth 2 (two) times daily.     fluticasone (FLONASE) 50 MCG/ACT nasal spray Place 1 spray into both nostrils daily.     hydrochlorothiazide (HYDRODIURIL) 25 MG tablet TAKE 1 TABLET BY MOUTH DAILY 90 tablet 0   metoprolol tartrate (LOPRESSOR) 25 MG tablet TAKE 1 TABLET BY MOUTH TWICE  DAILY 30 tablet 0   Multiple Vitamin (MULTIVITAMIN WITH MINERALS) TABS Take 1 tablet by mouth 2 (two) times daily.      nitroGLYCERIN (NITROSTAT) 0.4 MG SL tablet Place 1 tablet (0.4 mg total) under the tongue every 5 (five) minutes x 3 doses as needed. Chest pain 25 tablet 3   potassium chloride (KLOR-CON M) 10 MEQ tablet TAKE 1 TABLET BY MOUTH DAILY 90 tablet 0   RABEprazole (ACIPHEX) 20 MG tablet TAKE 1 TABLET BY MOUTH DAILY 30 tablet 11   traMADol (ULTRAM) 50 MG tablet Take 50 mg by mouth 3 (three) times daily as needed.     No current facility-administered medications for this visit.    Allergies:   Patient has no known allergies.    Social History:  The patient  reports that he has never smoked. He has never used smokeless tobacco. He reports that he does not drink alcohol and does not use drugs.   Family History:  The patient's family history includes Arrhythmia in his father; GER disease in his father; Heart Problems in his father.    ROS:   Noted in current history, otherwise  review of systems is negative.    Physical Exam: Blood pressure 118/80, pulse 77, height 5' 11.5" (1.816 m), weight 216 lb (98 kg), SpO2 95%.       GEN:  Well nourished, well developed in no acute distress HEENT: Normal NECK: No JVD; No carotid  bruits LYMPHATICS: No lymphadenopathy CARDIAC: RRR , no murmurs, rubs, gallops RESPIRATORY:  Clear to auscultation without rales, wheezing or rhonchi  ABDOMEN: Soft, non-tender, non-distended MUSCULOSKELETAL:  No edema; No deformity  SKIN: Warm and dry NEUROLOGIC:  Alert and oriented x 3     EKG:     EKG Interpretation Date/Time:  Friday December 27 2023 09:05:10 EDT Ventricular Rate:  77 PR Interval:  184 QRS Duration:  102 QT Interval:  388 QTC Calculation: 439 R Axis:   60  Text Interpretation: Normal sinus rhythm Normal ECG When compared with ECG of 22-Jun-2022 17:18, Premature ventricular complexes are no longer Present Confirmed by Kristeen Miss ([id number]) on 12/27/2023 9:22:02 AM     Recent Labs: No results found for requested labs within last 365 days.    Lipid Panel    Component Value Date/Time   CHOL 127 12/25/2022 0901   CHOL 90 03/15/2014 0743   TRIG 86 12/25/2022 0901   TRIG 86 03/15/2014 0743   HDL 49 12/25/2022 0901   HDL 37 (L) 03/15/2014 0743   CHOLHDL 2.6 12/25/2022 0901   CHOLHDL 2.4 04/03/2016 0812   VLDL 14 04/03/2016 0812   LDLCALC 61 12/25/2022 0901   LDLCALC 36 03/15/2014 0743      Wt Readings from Last 3 Encounters:  12/27/23 216 lb (98 kg)  12/25/22 214 lb 9.6 oz (97.3 kg)  10/23/22 210 lb 12.8 oz (95.6 kg)      Other studies Reviewed: Additional studies/ records that were reviewed today include: . Review of the above records demonstrates:    ASSESSMENT AND PLAN:  1. Coronary artery disease-  .    Denies having any episodes of chest pain.  He has severely dilated coronary arteries similar to Kawasaki disease.  He has had thrombosis of his coronary arteries.  This was all due to a hypercoagulable state that was not yet diagnosed.  He denies any chest pain.  He has been exercising on a regular basis.         2.   hypercoagulable state:  .    Marland Kitchen  Continue Eliquis.  2. Hyperlipidemia  -    his last lipids look great.      4.  HTN:       Blood pressure is well-controlled.  5.  Tachycardia: Has not had any further episodes of tachycardia.    Current medicines are reviewed at length with the patient today.  The patient does not have concerns regarding medicines.  The following changes have been made:  no change  Labs/ tests ordered today include:   Orders Placed This Encounter  Procedures   EKG 12-Lead     Disposition:   follow up in 1 year     Kristeen Miss, MD  12/27/2023 9:22 AM    [facility] [address] [place], [place], [place]  [address] Phone: [phone]; Fax: [phone]

## 2023-12-27 ENCOUNTER — Ambulatory Visit: Payer: BC Managed Care – PPO | Attending: Cardiovascular Disease | Admitting: Cardiovascular Disease

## 2023-12-27 VITALS — BP 118/80 | HR 77 | Ht 71.5 in | Wt 216.0 lb

## 2023-12-27 DIAGNOSIS — I10 Essential (primary) hypertension: Secondary | ICD-10-CM | POA: Diagnosis not present

## 2023-12-27 NOTE — Patient Instructions (Signed)

## 2023-12-30 ENCOUNTER — Other Ambulatory Visit: Payer: Self-pay | Admitting: Internal Medicine

## 2024-01-17 ENCOUNTER — Other Ambulatory Visit: Payer: Self-pay | Admitting: Cardiovascular Disease

## 2024-04-03 DIAGNOSIS — I10 Essential (primary) hypertension: Secondary | ICD-10-CM | POA: Diagnosis not present

## 2024-04-03 DIAGNOSIS — M79641 Pain in right hand: Secondary | ICD-10-CM | POA: Diagnosis not present

## 2024-04-16 ENCOUNTER — Encounter (INDEPENDENT_AMBULATORY_CARE_PROVIDER_SITE_OTHER): Payer: Self-pay | Admitting: *Deleted

## 2024-04-16 DIAGNOSIS — M79644 Pain in right finger(s): Secondary | ICD-10-CM | POA: Diagnosis not present

## 2024-05-15 DIAGNOSIS — M79644 Pain in right finger(s): Secondary | ICD-10-CM | POA: Diagnosis not present

## 2024-07-11 DIAGNOSIS — Z23 Encounter for immunization: Secondary | ICD-10-CM | POA: Diagnosis not present

## 2024-07-16 DIAGNOSIS — H40013 Open angle with borderline findings, low risk, bilateral: Secondary | ICD-10-CM | POA: Diagnosis not present

## 2024-07-16 DIAGNOSIS — H35371 Puckering of macula, right eye: Secondary | ICD-10-CM | POA: Diagnosis not present

## 2024-07-16 DIAGNOSIS — H04123 Dry eye syndrome of bilateral lacrimal glands: Secondary | ICD-10-CM | POA: Diagnosis not present

## 2024-07-16 DIAGNOSIS — H25813 Combined forms of age-related cataract, bilateral: Secondary | ICD-10-CM | POA: Diagnosis not present

## 2024-09-16 ENCOUNTER — Other Ambulatory Visit: Payer: Self-pay | Admitting: Internal Medicine

## 2024-09-18 ENCOUNTER — Other Ambulatory Visit: Payer: Self-pay

## 2024-09-18 ENCOUNTER — Encounter: Payer: Self-pay | Admitting: Internal Medicine

## 2024-09-18 ENCOUNTER — Telehealth: Payer: Self-pay | Admitting: *Deleted

## 2024-09-18 ENCOUNTER — Ambulatory Visit: Admitting: Internal Medicine

## 2024-09-18 VITALS — BP 117/77 | HR 83 | Temp 97.9°F | Ht 71.0 in | Wt 214.6 lb

## 2024-09-18 DIAGNOSIS — Z01810 Encounter for preprocedural cardiovascular examination: Secondary | ICD-10-CM

## 2024-09-18 DIAGNOSIS — K219 Gastro-esophageal reflux disease without esophagitis: Secondary | ICD-10-CM | POA: Diagnosis not present

## 2024-09-18 DIAGNOSIS — Z860100 Personal history of colon polyps, unspecified: Secondary | ICD-10-CM

## 2024-09-18 DIAGNOSIS — Z79899 Other long term (current) drug therapy: Secondary | ICD-10-CM

## 2024-09-18 NOTE — Telephone Encounter (Signed)
°  Request for patient to stop medication prior to procedure   09/18/2024  Eric M Springfield Jr. 20-Jun-1966  What type of surgery is being performed? COLONOSCOPY  When is surgery scheduled? TBD  What type of clearance is required (medical or pharmacy to hold medication or both? MEDICATION  Are there any medications that need to be held prior to surgery and how long? ELIQUIS X 2 DAYS PRIOR  Name of physician performing surgery?  Dr. Shaaron Rouse [facility] at [facility] Phone: [phone], option 5 Fax: [phone]  Anesthesia type (none, local, MAC, general)? MAC

## 2024-09-18 NOTE — Progress Notes (Signed)
 Gastroenterology Progress Note    Primary Care Physician:  Loreli Elsie JONETTA Mickey., MD Primary Gastroenterologist:  Dr. Shaaron  Pre-Procedure History & Physical: HPI:  Eric Caliendo. is a 58 y.o. male here for   Past Medical History:  Diagnosis Date   Anxiety    Complication of anesthesia    Coronary artery disease    ectatic coronaries per cath in August 2013 - managed with Brilinta  and aspirin    Gallbladder polyp 05/2011   on abd u/s   Gallstones 05/2011   on abd u/s   GERD (gastroesophageal reflux disease)    Hemorrhoid    s/p surgery August 2013   Hypertension    he denies   MI, acute, non ST segment elevation (HCC) 03/2011   thrombus of LAD   PONV (postoperative nausea and vomiting)     Past Surgical History:  Procedure Laterality Date   BRAVO PH STUDY  02/2008   Day 1, 155 episodes of acid reflux with longest episode 29 minutes, DeMeester 41.7. Day 2, 51 episodes, [place]ter 12.4. Two of three episodes of chest pain correlated with episode of acid reflux. Study done OFF PPI.   CARDIAC CATHETERIZATION  03/11/2011   This demonstrated marked ectasia of the proximal LAD with suggestion of a large filling defect consistent  with thrombus.  There was clot extending into the first septal   perforating branch which had slow flow.    CARDIAC CATHETERIZATION  05/19/2012   Angiographic data- large and slightly ectatic left main, no stenosis or thrombi, pLAD extremely large and ectatic, no thombi, remained LAD normal; normal LCx without stenosis or ectasia; minor luminal RCA irregularities, dRCA mildly ectatic; PDA and PLSA normal size.    CHOLECYSTECTOMY N/A 04/17/2013   Procedure: LAPAROSCOPIC CHOLECYSTECTOMY;  Surgeon: Oneil DELENA Budge, MD;  Location: [facility];  Service: General;  Laterality: N/A;   COLONOSCOPY N/A 05/15/2019   Procedure: COLONOSCOPY;  Surgeon: Shaaron Lamar CHRISTELLA, MD;  [facility];  Service: Endoscopy;  Laterality: N/A;  2:15pm   ESOPHAGOGASTRODUODENOSCOPY   02/2008   small hh   ESOPHAGOGASTRODUODENOSCOPY  06/19/2011   Dr. Tisha esophagus, small hiatal hernia o/w normal stomach   HEMORRHOID SURGERY  05/23/2012   Procedure: HEMORRHOIDECTOMY;  Surgeon: Oneil DELENA Budge, MD;  Location: [facility];  Service: General;  Laterality: N/A;   HEMORROIDECTOMY     KNEE ARTHROSCOPY     LEFT HEART CATHETERIZATION WITH CORONARY ANGIOGRAM N/A 05/19/2012   Procedure: LEFT HEART CATHETERIZATION WITH CORONARY ANGIOGRAM;  Surgeon: Aleene JINNY Passe, MD;  Location: [facility] CATH LAB;  Service: Cardiovascular;  Laterality: N/A;   POLYPECTOMY  05/15/2019   Procedure: POLYPECTOMY;  Surgeon: Shaaron Lamar CHRISTELLA, MD;  [facility];  Service: Endoscopy;;  splenic flex and hepatic flex   TRANSTHORACIC ECHOCARDIOGRAM  03/13/2011   Left ventricle: Mild distal septal hypokinesis The cavity size was normal. Systolic function was normal. The estimated ejection fraction was 55%. Wall motion was normal    Prior to Admission medications  Medication Sig Start Date End Date Taking? Authorizing Provider  acetaminophen  (TYLENOL ) 500 MG tablet Take 1,000 mg by mouth daily as needed for moderate pain.    Yes [provider]  ALPRAZolam  (XANAX ) 0.5 MG tablet Take 0.5 mg by mouth 2 (two) times daily as needed for anxiety.  05/03/11  Yes [provider]  amitriptyline  (ELAVIL ) 10 MG tablet TAKE 1 TABLET BY MOUTH AT  BEDTIME 12/30/23  Yes Kolden Dupee, Lamar CHRISTELLA, MD  apixaban (ELIQUIS) 5 MG TABS tablet Take 5 mg by mouth 2 (two) times daily.   Yes [provider]  aspirin  81 MG tablet Take 81 mg by mouth every evening.    Yes [provider]  atorvastatin  (LIPITOR ) 40 MG tablet TAKE 1 TABLET BY MOUTH DAILY 01/20/24  Yes Nahser, Aleene PARAS, MD  hydrochlorothiazide  (HYDRODIURIL ) 25 MG tablet TAKE 1 TABLET BY MOUTH DAILY 01/20/24  Yes Nahser, Aleene PARAS, MD  metoprolol  tartrate (LOPRESSOR ) 25 MG tablet TAKE 1 TABLET BY MOUTH TWICE  DAILY 04/09/22  Yes Nahser, Aleene PARAS, MD  Multiple  Vitamin (MULTIVITAMIN WITH MINERALS) TABS Take 1 tablet by mouth 2 (two) times daily.    Yes [provider]  nitroGLYCERIN  (NITROSTAT ) 0.4 MG SL tablet Place 1 tablet (0.4 mg total) under the tongue every 5 (five) minutes x 3 doses as needed. Chest pain 10/17/17  Yes Nahser, Aleene PARAS, MD  potassium chloride  (KLOR-CON  M) 10 MEQ tablet TAKE 1 TABLET BY MOUTH DAILY 01/20/24  Yes Nahser, Aleene PARAS, MD  RABEprazole  (ACIPHEX ) 20 MG tablet TAKE 1 TABLET BY MOUTH DAILY 09/17/24  Yes Shaaron Lamar HERO, MD    Allergies as of 09/18/2024   (No Known Allergies)    Family History  Problem Relation Age of Onset   Arrhythmia Father        a. flutter   GER disease Father    Heart Problems Father        nonischemic cardiomyopathy, ASCVD   Colon cancer Neg Hx     Social History   Socioeconomic History   Marital status: Single    Spouse name: Not on file   Number of children: 0   Years of education: Not on file   Highest education level: Not on file  Occupational History   Occupation: maintenance    Employer: [employer]  Tobacco Use   Smoking status: Never   Smokeless tobacco: Never  Vaping Use   Vaping status: Never Used  Substance and Sexual Activity   Alcohol  use: No   Drug use: No   Sexual activity: Not Currently  Other Topics Concern   Not on file  Social History Narrative   Not on file   Social Drivers of Health   Tobacco Use: Low Risk (09/18/2024)   Patient History    Smoking Tobacco Use: Never    Smokeless Tobacco Use: Never    Passive Exposure: Not on file  Financial Resource Strain: Not on file  Food Insecurity: Not on file  Transportation Needs: Not on file  Physical Activity: Not on file  Stress: Not on file  Social Connections: Not on file  Intimate Partner Violence: Not on file  Depression (EYV7-0): Not on file  Alcohol  Screen: Not on file  Housing: Not on file  Utilities: Not on file  Health Literacy: Not on file    Review of Systems   See HPI,  otherwise negative ROS  Physical Exam: BP 117/77   Pulse 83   Temp 97.9 F (36.6 C) (Oral)   Ht 5' 11 (1.803 m)   Wt 214 lb 9.6 oz (97.3 kg)   SpO2 97%   BMI 29.93 kg/m  General:   Alert,  Well-developed, well-nourished, pleasant and cooperative in NAD Skin:  Intact without significant lesions or rashes. Eyes:  Sclera clear, no icterus.   Conjunctiva pink. Ears:  Normal auditory acuity. Nose:  No deformity, discharge,  or lesions. Mouth:  No deformity or lesions. Neck:  Supple; no  masses or thyromegaly. No significant cervical adenopathy. Lungs:  Clear throughout to auscultation.   No wheezes, crackles, or rhonchi. No acute distress. Heart:  Regular rate and rhythm; no murmurs, clicks, rubs,  or gallops. Abdomen: Non-distended, normal bowel sounds.  Soft and nontender without appreciable mass or hepatosplenomegaly.  Pulses:  Normal pulses noted. Extremities:  Without clubbing or edema.   Impression/Plan:       Notice: This dictation was prepared with Dragon dictation along with smaller phrase technology. Any transcriptional errors that result from this process are unintentional and may not be corrected upon review.

## 2024-09-18 NOTE — Telephone Encounter (Signed)
 Orders have been added and patient informed. Provided patient with the nearest [facility] to him that he may have them done at.

## 2024-09-18 NOTE — Telephone Encounter (Signed)
 Patient needs labs, CBC and BMET for pharmacy recommendations, please.

## 2024-09-18 NOTE — Progress Notes (Signed)
 Gastroenterology Progress Note    Primary Care Physician:  Loreli Elsie JONETTA Mickey., MD Primary Gastroenterologist:  Dr. Shaaron  Pre-Procedure History & Physical: HPI:  Eric Fisher. is a 58 y.o. male here for follow-up of GERD.  GERD well-controlled on Aciphex  20 mg twice daily.  He has required twice daily dosing for good control.  No dysphagia.  No bowel symptoms.  Had 2 adenomas removed from his colon 2020; due for surveillance in the near future.  Continues on Eliquis for back issues.  Past Medical History:  Diagnosis Date   Anxiety    Complication of anesthesia    Coronary artery disease    ectatic coronaries per cath in August 2013 - managed with Brilinta  and aspirin    Gallbladder polyp 05/2011   on abd u/s   Gallstones 05/2011   on abd u/s   GERD (gastroesophageal reflux disease)    Hemorrhoid    s/p surgery August 2013   Hypertension    he denies   MI, acute, non ST segment elevation (HCC) 03/2011   thrombus of LAD   PONV (postoperative nausea and vomiting)     Past Surgical History:  Procedure Laterality Date   BRAVO PH STUDY  02/2008   Day 1, 155 episodes of acid reflux with longest episode 29 minutes, DeMeester 41.7. Day 2, 51 episodes, DeMeester 12.4. Two of three episodes of chest pain correlated with episode of acid reflux. Study done OFF PPI.   CARDIAC CATHETERIZATION  03/11/2011   This demonstrated marked ectasia of the proximal LAD with suggestion of a large filling defect consistent  with thrombus.  There was clot extending into the first septal   perforating branch which had slow flow.    CARDIAC CATHETERIZATION  05/19/2012   Angiographic data- large and slightly ectatic left main, no stenosis or thrombi, pLAD extremely large and ectatic, no thombi, remained LAD normal; normal LCx without stenosis or ectasia; minor luminal RCA irregularities, dRCA mildly ectatic; PDA and PLSA normal size.    CHOLECYSTECTOMY N/A 04/17/2013   Procedure: LAPAROSCOPIC  CHOLECYSTECTOMY;  Surgeon: Oneil DELENA Budge, MD;  Location: [facility];  Service: General;  Laterality: N/A;   COLONOSCOPY N/A 05/15/2019   Procedure: COLONOSCOPY;  Surgeon: Shaaron Lamar CHRISTELLA, MD;  Location: [facility];  Service: Endoscopy;  Laterality: N/A;  2:15pm   ESOPHAGOGASTRODUODENOSCOPY  02/2008   small hh   ESOPHAGOGASTRODUODENOSCOPY  06/19/2011   Dr. Tisha esophagus, small hiatal hernia o/w normal stomach   HEMORRHOID SURGERY  05/23/2012   Procedure: HEMORRHOIDECTOMY;  Surgeon: Oneil DELENA Budge, MD;  Location: [facility];  Service: General;  Laterality: N/A;   HEMORROIDECTOMY     KNEE ARTHROSCOPY     LEFT HEART CATHETERIZATION WITH CORONARY ANGIOGRAM N/A 05/19/2012   Procedure: LEFT HEART CATHETERIZATION WITH CORONARY ANGIOGRAM;  Surgeon: Aleene JINNY Passe, MD;  Location: [facility] CATH LAB;  Service: Cardiovascular;  Laterality: N/A;   POLYPECTOMY  05/15/2019   Procedure: POLYPECTOMY;  Surgeon: Shaaron Lamar CHRISTELLA, MD;  Location: [facility];  Service: Endoscopy;;  splenic flex and hepatic flex   TRANSTHORACIC ECHOCARDIOGRAM  03/13/2011   Left ventricle: Mild distal septal hypokinesis The cavity size was normal. Systolic function was normal. The estimated ejection fraction was 55%. Wall motion was normal    Prior to Admission medications  Medication Sig Start Date End Date Taking? Authorizing Provider  acetaminophen  (TYLENOL ) 500 MG tablet Take 1,000 mg by mouth daily as needed for moderate pain.    Yes  [provider]  ALPRAZolam  (XANAX ) 0.5 MG tablet Take 0.5 mg by mouth 2 (two) times daily as needed for anxiety.  05/03/11  Yes [provider]  amitriptyline  (ELAVIL ) 10 MG tablet TAKE 1 TABLET BY MOUTH AT  BEDTIME 12/30/23  Yes Glora Hulgan, Lamar HERO, MD  apixaban (ELIQUIS) 5 MG TABS tablet Take 5 mg by mouth 2 (two) times daily.   Yes [provider]  aspirin  81 MG tablet Take 81 mg by mouth every evening.    Yes [provider]  atorvastatin  (LIPITOR ) 40 MG tablet TAKE 1  TABLET BY MOUTH DAILY 01/20/24  Yes Nahser, Aleene PARAS, MD  hydrochlorothiazide  (HYDRODIURIL ) 25 MG tablet TAKE 1 TABLET BY MOUTH DAILY 01/20/24  Yes Nahser, Aleene PARAS, MD  metoprolol  tartrate (LOPRESSOR ) 25 MG tablet TAKE 1 TABLET BY MOUTH TWICE  DAILY 04/09/22  Yes Nahser, Aleene PARAS, MD  Multiple Vitamin (MULTIVITAMIN WITH MINERALS) TABS Take 1 tablet by mouth 2 (two) times daily.    Yes [provider]  nitroGLYCERIN  (NITROSTAT ) 0.4 MG SL tablet Place 1 tablet (0.4 mg total) under the tongue every 5 (five) minutes x 3 doses as needed. Chest pain 10/17/17  Yes Nahser, Aleene PARAS, MD  potassium chloride  (KLOR-CON  M) 10 MEQ tablet TAKE 1 TABLET BY MOUTH DAILY 01/20/24  Yes Nahser, Aleene PARAS, MD  RABEprazole  (ACIPHEX ) 20 MG tablet TAKE 1 TABLET BY MOUTH DAILY 09/17/24  Yes Shaaron Lamar HERO, MD    Allergies as of 09/18/2024   (No Known Allergies)    Family History  Problem Relation Age of Onset   Arrhythmia Father        a. flutter   GER disease Father    Heart Problems Father        nonischemic cardiomyopathy, ASCVD   Colon cancer Neg Hx     Social History   Socioeconomic History   Marital status: Single    Spouse name: Not on file   Number of children: 0   Years of education: Not on file   Highest education level: Not on file  Occupational History   Occupation: maintenance    Employer: [employer]  Tobacco Use   Smoking status: Never   Smokeless tobacco: Never  Vaping Use   Vaping status: Never Used  Substance and Sexual Activity   Alcohol  use: No   Drug use: No   Sexual activity: Not Currently  Other Topics Concern   Not on file  Social History Narrative   Not on file   Social Drivers of Health   Tobacco Use: Low Risk (09/18/2024)   Patient History    Smoking Tobacco Use: Never    Smokeless Tobacco Use: Never    Passive Exposure: Not on file  Financial Resource Strain: Not on file  Food Insecurity: Not on file  Transportation Needs: Not on file  Physical Activity:  Not on file  Stress: Not on file  Social Connections: Not on file  Intimate Partner Violence: Not on file  Depression (EYV7-0): Not on file  Alcohol  Screen: Not on file  Housing: Not on file  Utilities: Not on file  Health Literacy: Not on file    Review of Systems   See HPI, otherwise negative ROS  Physical Exam: BP 117/77   Pulse 83   Temp 97.9 F (36.6 C) (Oral)   Ht 5' 11 (1.803 m)   Wt 214 lb 9.6 oz (97.3 kg)   SpO2 97%   BMI 29.93 kg/m  General:  Alert,  Well-developed, well-nourished, pleasant and cooperative in NAD Lungs:  Clear throughout to auscultation.   No wheezes, crackles, or rhonchi. No acute distress. Heart:  Regular rate and rhythm; no murmurs, clicks, rubs,  or gallops. Abdomen: Non-distended, normal bowel sounds.  Soft and nontender without appreciable mass or hepatosplenomegaly.    Impression/Plan:    Very pleasant 58 year old gentleman longstanding GERD well-controlled on twice daily rabeprazole .  No alarm symptoms.  History of multiple colonic adenomas removed 2020; due for surveillance colonoscopy now.  ASA 3 room 1 okay.  Have offered the patient a surveillance colonoscopy in the near future history of colonic polyps.  The risks, benefits, limitations, alternatives and imponderables have been reviewed with the patient. Questions have been answered. All parties are agreeable.    Get the okay from cardiology to hold his Eliquis x 2 days.    Notice: This dictation was prepared with Dragon dictation along with smaller phrase technology. Any transcriptional errors that result from this process are unintentional and may not be corrected upon review.

## 2024-09-18 NOTE — Patient Instructions (Signed)
 Very nice to see you again today!  As discussed, you will be due for a surveillance colonoscopy first of the year (history of colon polyps).  ASA 3-room 1 okay.  Continue Aciphex  or rabeprazole  20 mg twice daily for good control of GERD  I would like you to not take your Eliquis 2 days prior to the colonoscopy.  We will get the okay from your cardiologist for this.  Further recommendations to follow.

## 2024-09-18 NOTE — Addendum Note (Signed)
 Addended by: GEROME IVAL SAILOR on: 09/18/2024 11:18 AM   Modules accepted: Orders

## 2024-09-19 LAB — BASIC METABOLIC PANEL WITH GFR
BUN/Creatinine Ratio: 9 (ref 9–20)
BUN: 9 mg/dL (ref 6–24)
CO2: 26 mmol/L (ref 20–29)
Calcium: 9.7 mg/dL (ref 8.7–10.2)
Chloride: 100 mmol/L (ref 96–106)
Creatinine, Ser: 1 mg/dL (ref 0.76–1.27)
Glucose: 146 mg/dL — AB (ref 70–99)
Potassium: 4 mmol/L (ref 3.5–5.2)
Sodium: 139 mmol/L (ref 134–144)
eGFR: 88 mL/min/1.73 (ref >59)

## 2024-09-19 LAB — CBC
Hematocrit: 44.4 % (ref 37.5–51.0)
Hemoglobin: 14.8 g/dL (ref 13.0–17.7)
MCH: 30.3 pg (ref 26.6–33.0)
MCHC: 33.3 g/dL (ref 31.5–35.7)
MCV: 91 fL (ref 79–97)
Platelets: 221 x10E3/uL (ref 150–450)
RBC: 4.88 x10E6/uL (ref 4.14–5.80)
RDW: 12.9 % (ref 11.6–15.4)
WBC: 5.2 x10E3/uL (ref 3.4–10.8)

## 2024-09-23 NOTE — Telephone Encounter (Signed)
 Will route to pharm for input, labs have returned 09/18/24. Cr 1.0, Hgb 14.8, plt 221.

## 2024-09-24 NOTE — Telephone Encounter (Signed)
 Patient with diagnosis of DVT on Eliquis for anticoagulation.    What type of surgery is being performed? COLONOSCOPY   When is surgery scheduled? TBD   CHA2DS2-VASc Score =    N/A  This indicates a  % annual risk of stroke. The patient's score is based upon:       CrCl 98 ml/min Platelet count 221 K  N/A: Patient has not had an Afib/aflutter ablation in the last 3 months, DCCV within the last 4 weeks or a watchman implanted in the last 45 days   Per office protocol, patient can hold Eliquis for 2 days prior to procedure.    Patient will not need bridging with Lovenox  (enoxaparin ) around procedure.  **This guidance is not considered finalized until pre-operative APP has relayed final recommendations.**

## 2024-09-24 NOTE — Telephone Encounter (Signed)
° °  Name: Eric Fisher.  DOB: 1966/08/08  MRN: [id number]   Primary Cardiologist: Aleene Passe, MD (Inactive)  Chart reviewed as part of pre-operative protocol coverage.   Per Pharm D, patient has not had an Afib/aflutter ablation within the last 3 months, DCCV within the last 4 weeks, or Watchman in the last 45 days. Patient may hold Eliquis for 2 days prior to procedure.  Patient will not need bridging with Lovenox  around procedure.    I will route this recommendation to the requesting party via Epic fax function and remove from pre-op pool. Please call with questions.  Barnie Hila, NP 09/24/2024, 2:56 PM

## 2024-09-25 MED ORDER — PEG 3350-KCL-NA BICARB-NACL 420 G PO SOLR: 4000.0000 mL | ORAL | 0 refills | Status: AC

## 2024-09-25 NOTE — Addendum Note (Signed)
 Addended by: JEANELL GRAEME RAMAN on: 09/25/2024 08:34 AM   Modules accepted: Orders

## 2024-09-25 NOTE — Telephone Encounter (Addendum)
 Spoke with pt. He has been scheduled for TCS with Dr. Shaaron ASA 3 ok rm 1 on 10/21/24. Aware will send instructions in the mail, rx for prep to be sent to his pharmacy and he will get a pre-op phone call a few days prior to procedure with his arrival time. Also aware ok to hold his eliquis x 2 days prior.   Per carelon for PA Pre-Authorization is not Required

## 2024-10-06 DIAGNOSIS — I10 Essential (primary) hypertension: Secondary | ICD-10-CM | POA: Diagnosis not present

## 2024-10-06 DIAGNOSIS — M545 Low back pain, unspecified: Secondary | ICD-10-CM | POA: Diagnosis not present

## 2024-10-16 ENCOUNTER — Encounter (HOSPITAL_COMMUNITY): Payer: Self-pay

## 2024-10-16 ENCOUNTER — Encounter (HOSPITAL_COMMUNITY)
Admission: RE | Admit: 2024-10-16 | Discharge: 2024-10-16 | Disposition: A | Source: Ambulatory Visit | Attending: Internal Medicine

## 2024-10-16 ENCOUNTER — Other Ambulatory Visit: Payer: Self-pay

## 2024-10-21 ENCOUNTER — Ambulatory Visit (HOSPITAL_COMMUNITY): Payer: Self-pay | Admitting: Anesthesiology

## 2024-10-21 ENCOUNTER — Encounter (HOSPITAL_COMMUNITY): Payer: Self-pay | Admitting: Internal Medicine

## 2024-10-21 ENCOUNTER — Encounter (HOSPITAL_COMMUNITY): Payer: Self-pay | Admitting: Anesthesiology

## 2024-10-21 ENCOUNTER — Ambulatory Visit (HOSPITAL_COMMUNITY)
Admission: RE | Admit: 2024-10-21 | Discharge: 2024-10-21 | Disposition: A | Discharge status: Home / Self Care | Attending: Internal Medicine | Admitting: Internal Medicine

## 2024-10-21 ENCOUNTER — Encounter (HOSPITAL_COMMUNITY)
Admission: RE | Disposition: A | Discharge status: Home / Self Care | Payer: Self-pay | Source: Home / Self Care | Attending: Internal Medicine

## 2024-10-21 ENCOUNTER — Other Ambulatory Visit: Payer: Self-pay

## 2024-10-21 DIAGNOSIS — I252 Old myocardial infarction: Secondary | ICD-10-CM | POA: Diagnosis not present

## 2024-10-21 DIAGNOSIS — I10 Essential (primary) hypertension: Secondary | ICD-10-CM | POA: Diagnosis not present

## 2024-10-21 DIAGNOSIS — K219 Gastro-esophageal reflux disease without esophagitis: Secondary | ICD-10-CM | POA: Insufficient documentation

## 2024-10-21 DIAGNOSIS — F419 Anxiety disorder, unspecified: Secondary | ICD-10-CM | POA: Diagnosis not present

## 2024-10-21 DIAGNOSIS — Q438 Other specified congenital malformations of intestine: Secondary | ICD-10-CM | POA: Diagnosis not present

## 2024-10-21 DIAGNOSIS — Z860101 Personal history of adenomatous and serrated colon polyps: Secondary | ICD-10-CM | POA: Diagnosis not present

## 2024-10-21 DIAGNOSIS — I2510 Atherosclerotic heart disease of native coronary artery without angina pectoris: Secondary | ICD-10-CM | POA: Diagnosis not present

## 2024-10-21 DIAGNOSIS — Z1211 Encounter for screening for malignant neoplasm of colon: Secondary | ICD-10-CM | POA: Diagnosis not present

## 2024-10-21 DIAGNOSIS — K640 First degree hemorrhoids: Secondary | ICD-10-CM | POA: Insufficient documentation

## 2024-10-21 HISTORY — PX: COLONOSCOPY: SHX5424

## 2024-10-21 MED ORDER — LACTATED RINGERS IV SOLN: INTRAVENOUS | Status: DC

## 2024-10-21 MED ORDER — PROPOFOL 10 MG/ML IV BOLUS
INTRAVENOUS | Status: DC | PRN
  Administered 2024-10-21: 100 mg via INTRAVENOUS
  Administered 2024-10-21: 50 mg via INTRAVENOUS

## 2024-10-21 MED ORDER — PROPOFOL 500 MG/50ML IV EMUL
INTRAVENOUS | Status: DC | PRN
  Administered 2024-10-21: 150 ug/kg/min via INTRAVENOUS

## 2024-10-21 MED ORDER — LIDOCAINE 2% (20 MG/ML) 5 ML SYRINGE
INTRAMUSCULAR | Status: DC | PRN
  Administered 2024-10-21: 60 mg via INTRAVENOUS

## 2024-10-21 NOTE — H&P (Signed)
 @LOGO @   Gastroenterology Progress Note    Primary Care Physician:  Loreli Elsie JONETTA Mickey., MD Primary Gastroenterologist:  Dr. Shaaron  Pre-Procedure History & Physical: HPI:  Eric Fisher is a 59 y.o. male here for Surveillance colonoscopy.  History of colonic adenomas removed 2020.  Past Medical History:  Diagnosis Date   Anxiety    Complication of anesthesia    Coronary artery disease    ectatic coronaries per cath in August 2013 - managed with Brilinta  and aspirin    Gallbladder polyp 05/2011   on abd u/s   Gallstones 05/2011   on abd u/s   GERD (gastroesophageal reflux disease)    Hemorrhoid    s/p surgery August 2013   Hypertension    he denies   MI, acute, non ST segment elevation (HCC) 03/2011   thrombus of LAD   PONV (postoperative nausea and vomiting)     Past Surgical History:  Procedure Laterality Date   BRAVO PH STUDY  02/2008   Day 1, 155 episodes of acid reflux with longest episode 29 minutes, DeMeester 41.7. Day 2, 51 episodes, DeMeester 12.4. Two of three episodes of chest pain correlated with episode of acid reflux. Study done OFF PPI.   CARDIAC CATHETERIZATION  03/11/2011   This demonstrated marked ectasia of the proximal LAD with suggestion of a large filling defect consistent  with thrombus.  There was clot extending into the first septal   perforating branch which had slow flow.    CARDIAC CATHETERIZATION  05/19/2012   Angiographic data- large and slightly ectatic left main, no stenosis or thrombi, pLAD extremely large and ectatic, no thombi, remained LAD normal; normal LCx without stenosis or ectasia; minor luminal RCA irregularities, dRCA mildly ectatic; PDA and PLSA normal size.    CHOLECYSTECTOMY N/A 04/17/2013   Procedure: LAPAROSCOPIC CHOLECYSTECTOMY;  Surgeon: Oneil DELENA Budge, MD;  Location: [facility];  Service: General;  Laterality: N/A;   COLONOSCOPY N/A 05/15/2019   Procedure: COLONOSCOPY;  Surgeon: Shaaron Lamar CHRISTELLA, MD;  Location: [facility];  Service:  Endoscopy;  Laterality: N/A;  2:15pm   ESOPHAGOGASTRODUODENOSCOPY  02/2008   small hh   ESOPHAGOGASTRODUODENOSCOPY  06/19/2011   Dr. Tisha esophagus, small hiatal hernia o/w normal stomach   HEMORRHOID SURGERY  05/23/2012   Procedure: HEMORRHOIDECTOMY;  Surgeon: Oneil DELENA Budge, MD;  Location: [facility];  Service: General;  Laterality: N/A;   HEMORROIDECTOMY     KNEE ARTHROSCOPY     LEFT HEART CATHETERIZATION WITH CORONARY ANGIOGRAM N/A 05/19/2012   Procedure: LEFT HEART CATHETERIZATION WITH CORONARY ANGIOGRAM;  Surgeon: Aleene JINNY Passe, MD;  Location: [facility] CATH LAB;  Service: Cardiovascular;  Laterality: N/A;   POLYPECTOMY  05/15/2019   Procedure: POLYPECTOMY;  Surgeon: Shaaron Lamar CHRISTELLA, MD;  Location: [facility];  Service: Endoscopy;;  splenic flex and hepatic flex   TRANSTHORACIC ECHOCARDIOGRAM  03/13/2011   Left ventricle: Mild distal septal hypokinesis The cavity size was normal. Systolic function was normal. The estimated ejection fraction was 55%. Wall motion was normal    Prior to Admission medications  Medication Sig Start Date End Date Taking? Authorizing Provider  acetaminophen  (TYLENOL ) 500 MG tablet Take 1,000 mg by mouth daily as needed for moderate pain.    Yes [provider]  ALPRAZolam  (XANAX ) 0.5 MG tablet Take 0.5 mg by mouth 2 (two) times daily as needed for anxiety.  05/03/11  Yes [provider]  amitriptyline  (ELAVIL ) 10 MG tablet TAKE 1 TABLET BY MOUTH AT  BEDTIME  12/30/23  Yes Loreli Debruler, Lamar HERO, MD  aspirin  81 MG tablet Take 81 mg by mouth every evening.    Yes [provider]  atorvastatin  (LIPITOR ) 40 MG tablet TAKE 1 TABLET BY MOUTH DAILY 01/20/24  Yes Nahser, Aleene PARAS, MD  cyclobenzaprine (FLEXERIL) 5 MG tablet Take 5 mg by mouth 3 (three) times daily as needed for muscle spasms.   Yes [provider]  hydrochlorothiazide  (HYDRODIURIL ) 25 MG tablet TAKE 1 TABLET BY MOUTH DAILY 01/20/24  Yes Nahser, Aleene PARAS, MD  metoprolol  tartrate  (LOPRESSOR ) 25 MG tablet TAKE 1 TABLET BY MOUTH TWICE  DAILY 04/09/22  Yes Nahser, Aleene PARAS, MD  Multiple Vitamin (MULTIVITAMIN WITH MINERALS) TABS Take 1 tablet by mouth 2 (two) times daily.    Yes [provider]  potassium chloride  (KLOR-CON  M) 10 MEQ tablet TAKE 1 TABLET BY MOUTH DAILY 01/20/24  Yes Nahser, Aleene PARAS, MD  RABEprazole  (ACIPHEX ) 20 MG tablet TAKE 1 TABLET BY MOUTH DAILY 09/17/24  Yes Jesilyn Easom, Lamar HERO, MD  apixaban (ELIQUIS) 5 MG TABS tablet Take 5 mg by mouth 2 (two) times daily.    [provider]  nitroGLYCERIN  (NITROSTAT ) 0.4 MG SL tablet Place 1 tablet (0.4 mg total) under the tongue every 5 (five) minutes x 3 doses as needed. Chest pain 10/17/17   Nahser, Aleene PARAS, MD  polyethylene glycol-electrolytes (NULYTELY) 420 g solution Take 4,000 mLs by mouth as directed. 09/25/24   Shaaron Lamar HERO, MD    Allergies as of 09/25/2024   (No Known Allergies)    Family History  Problem Relation Age of Onset   Arrhythmia Father        a. flutter   GER disease Father    Heart Problems Father        nonischemic cardiomyopathy, ASCVD   Colon cancer Neg Hx     Social History   Socioeconomic History   Marital status: Single    Spouse name: Not on file   Number of children: 0   Years of education: Not on file   Highest education level: Not on file  Occupational History   Occupation: maintenance    Employer: [employer]  Tobacco Use   Smoking status: Never   Smokeless tobacco: Never  Vaping Use   Vaping status: Never Used  Substance and Sexual Activity   Alcohol  use: No   Drug use: No   Sexual activity: Not Currently  Other Topics Concern   Not on file  Social History Narrative   Not on file   Social Drivers of Health   Tobacco Use: Low Risk (10/21/2024)   Patient History    Smoking Tobacco Use: Never    Smokeless Tobacco Use: Never    Passive Exposure: Not on file  Financial Resource Strain: Not on file  Food Insecurity: Not on file  Transportation  Needs: Not on file  Physical Activity: Not on file  Stress: Not on file  Social Connections: Not on file  Intimate Partner Violence: Not on file  Depression (PHQ2-9): Not on file  Alcohol  Screen: Not on file  Housing: Not on file  Utilities: Not on file  Health Literacy: Not on file    Review of Systems   See HPI, otherwise negative ROS  Physical Exam: BP 131/84   Pulse (!) 104   Temp 98.5 F (36.9 C) (Oral)   Resp 13   Ht 5' 11 (1.803 m)   Wt 97.5 kg   SpO2 97%   BMI  29.99 kg/m  General:   Alert,  Well-developed, well-nourished, pleasant and cooperative in NAD Neck:  Supple; no masses or thyromegaly. No significant cervical adenopathy. Lungs:  Clear throughout to auscultation.   No wheezes, crackles, or rhonchi. No acute distress. Heart:  Regular rate and rhythm; no murmurs, clicks, rubs,  or gallops. Abdomen: Non-distended, normal bowel sounds.  Soft and nontender without appreciable mass or hepatosplenomegaly.   Impression/Plan:   59 year old gentleman history of colonic adenoma here for surveillance colonoscopy for plan.    Eliquis held 3 days ago.  The risks, benefits, limitations, alternatives and imponderables have been reviewed with the patient. Questions have been answered. All parties are agreeable.      Notice: This dictation was prepared with Dragon dictation along with smaller phrase technology. Any transcriptional errors that result from this process are unintentional and may not be corrected upon review.

## 2024-10-21 NOTE — Op Note (Signed)
 [name] Procedure Date: 10/21/2024 7:53 AM MRN: [id number] Date of Birth: Jan 05, 1966 Attending MD: Lamar Ozell Hollingshead , MD, [id number] CSN: [id number] Age: 59 Admit Type: Outpatient Procedure:                Colonoscopy Indications:              High risk colon cancer surveillance: Personal                            history of colonic polyps Providers:                Lamar Ozell Hollingshead, MD, Devere Lodge, Daphne Mulch                            Technician, Technician Referring MD:              Medicines:                Propofol  per Anesthesia Complications:            No immediate complications. Estimated Blood Loss:     Estimated blood loss: none. Procedure:                Pre-Anesthesia Assessment:                           - Prior to the procedure, a History and Physical                            was performed, and patient medications and                            allergies were reviewed. The patient's tolerance of                            previous anesthesia was also reviewed. The risks                            and benefits of the procedure and the sedation                            options and risks were discussed with the patient.                            All questions were answered, and informed consent                            was obtained. Prior Anticoagulants: The patient                            last took Eliquis (apixaban) 4 days prior to the                            procedure. ASA Grade Assessment: III - A patient  with severe systemic disease. After reviewing the                            risks and benefits, the patient was deemed in                            satisfactory condition to undergo the procedure.                           After obtaining informed consent, the colonoscope                            was passed under direct vision. Throughout the                             procedure, the patient's blood pressure, pulse, and                            oxygen saturations were monitored continuously. The                            [id number] ([id number]) Colon was introduced through                            the anus and advanced to the the cecum, identified                            by appendiceal orifice and ileocecal valve. The                            colonoscopy was performed without difficulty. The                            patient tolerated the procedure well. The quality                            of the bowel preparation was adequate. The                            ileocecal valve, appendiceal orifice, and rectum                            were photographed. The patient tolerated the                            procedure well. The quality of the bowel                            preparation was adequate. Scope In: 8:51:09 AM Scope Out: 9:10:44 AM Scope Withdrawal Time: 0 hours 8 minutes 27 seconds  Total Procedure Duration: 0 hours 19 minutes 35 seconds  Findings:      The perianal and digital rectal examinations were normal.      Non-bleeding internal hemorrhoids were found during retroflexion. The  hemorrhoids were moderate, medium-sized and Grade I (internal       hemorrhoids that do not prolapse).      The colon (entire examined portion) appeared normal. Redundant and       elongated      The exam was otherwise without abnormality on direct and retroflexion       views. Impression:               - Non-bleeding internal hemorrhoids.                           - The entire examined colon is normal. Redundant                            and elongated                           - The examination was otherwise normal on direct                            and retroflexion views.                           - No specimens collected. Moderate Sedation:      Moderate (conscious) sedation was administered by the nurse and       supervised by the  endoscopist. The following parameters were monitored:       oxygen saturation, heart rate, blood pressure, and response to care. Recommendation:           - Patient has a contact number available for                            emergencies. The signs and symptoms of potential                            delayed complications were discussed with the                            patient. Return to normal activities tomorrow.                            Written discharge instructions were provided to the                            patient.                           - Advance diet as tolerated.                           - Continue present medications. Resume Eliquis                            today.                           - Repeat colonoscopy in 7 years for surveillance.                           -  Return to GI office (date not yet determined). Procedure Code(s):        --- Professional ---                           [phone], Colonoscopy, flexible; diagnostic, including                            collection of specimen(s) by brushing or washing,                            when performed (separate procedure) Diagnosis Code(s):        --- Professional ---                           Z86.010, Personal history of colonic polyps                           K64.0, First degree hemorrhoids CPT copyright 2022 [employer]. All rights reserved. The codes documented in this report are preliminary and upon coder review may  be revised to meet current compliance requirements. Lamar HERO. Malashia Kamaka, MD Lamar Ozell Hollingshead, MD 10/21/2024 9:22:06 AM This report has been signed electronically. Number of Addenda: 0

## 2024-10-21 NOTE — Anesthesia Preprocedure Evaluation (Signed)
"                                    Anesthesia Evaluation  Patient identified by MRN, date of birth, ID band Patient awake    Reviewed: Allergy & Precautions, H&P , NPO status , Patient's Chart, lab work & pertinent test results, reviewed documented beta blocker date and time   History of Anesthesia Complications (+) PONV and history of anesthetic complications  Airway Mallampati: II  TM Distance: >3 FB Neck ROM: full    Dental no notable dental hx.    Pulmonary neg pulmonary ROS   Pulmonary exam normal breath sounds clear to auscultation       Cardiovascular Exercise Tolerance: Good hypertension, + CAD and + Past MI   Rhythm:regular Rate:Normal     Neuro/Psych   Anxiety     negative neurological ROS  negative psych ROS   GI/Hepatic Neg liver ROS,GERD  ,,  Endo/Other  negative endocrine ROS    Renal/GU negative Renal ROS  negative genitourinary   Musculoskeletal   Abdominal   Peds  Hematology negative hematology ROS (+)   Anesthesia Other Findings   Reproductive/Obstetrics negative OB ROS                              Anesthesia Physical Anesthesia Plan  ASA: 3  Anesthesia Plan: MAC   Post-op Pain Management:    Induction:   PONV Risk Score and Plan: Propofol  infusion  Airway Management Planned:   Additional Equipment:   Intra-op Plan:   Post-operative Plan:   Informed Consent: I have reviewed the patients History and Physical, chart, labs and discussed the procedure including the risks, benefits and alternatives for the proposed anesthesia with the patient or authorized representative who has indicated his/her understanding and acceptance.     Dental Advisory Given  Plan Discussed with: CRNA  Anesthesia Plan Comments:         Anesthesia Quick Evaluation  "

## 2024-10-21 NOTE — Anesthesia Postprocedure Evaluation (Signed)
"   Anesthesia Post Note  Patient: Eric Fisher  Procedure(s) Performed: COLONOSCOPY  Patient location during evaluation: Short Stay Anesthesia Type: MAC Level of consciousness: awake and alert Pain management: pain level controlled Vital Signs Assessment: post-procedure vital signs reviewed and stable Respiratory status: spontaneous breathing Cardiovascular status: blood pressure returned to baseline Postop Assessment: no apparent nausea or vomiting Anesthetic complications: no   No notable events documented.   Last Vitals:  Vitals:   10/21/24 0726 10/21/24 0915  BP: 131/84 (!) 109/97  Pulse: (!) 104 81  Resp: 13 18  Temp: 36.9 C 36.9 C  SpO2: 97% 96%    Last Pain:  Vitals:   10/21/24 0915  TempSrc: Axillary  PainSc:                  [name]      "

## 2024-10-21 NOTE — Transfer of Care (Signed)
 Immediate Anesthesia Transfer of Care Note  Patient: Eric Fisher  Procedure(s) Performed: COLONOSCOPY  Patient Location: Short Stay  Anesthesia Type:General  Level of Consciousness: awake  Airway & Oxygen Therapy: Patient Spontanous Breathing  Post-op Assessment: Report given to RN  Post vital signs: Reviewed and stable  Last Vitals:  Vitals Value Taken Time  BP    Temp    Pulse    Resp    SpO2      Last Pain:  Vitals:   10/21/24 0846  TempSrc:   PainSc: 6       Patients Stated Pain Goal: 7 (10/21/24 0726)  Complications: No notable events documented.

## 2024-10-21 NOTE — Discharge Instructions (Addendum)
" °  Colonoscopy Discharge Instructions  Read the instructions outlined below and refer to this sheet in the next few weeks. These discharge instructions provide you with general information on caring for yourself after you leave the hospital. Your doctor may also give you specific instructions. While your treatment has been planned according to the most current medical practices available, unavoidable complications occasionally occur. If you have any problems or questions after discharge, call Dr. Shaaron at [phone]. ACTIVITY You may resume your regular activity, but move at a slower pace for the next 24 hours.  Take frequent rest periods for the next 24 hours.  Walking will help get rid of the air and reduce the bloated feeling in your belly (abdomen).  No driving for 24 hours (because of the medicine (anesthesia) used during the test).   Do not sign any important legal documents or operate any machinery for 24 hours (because of the anesthesia used during the test).  NUTRITION Drink plenty of fluids.  You may resume your normal diet as instructed by your doctor.  Begin with a light meal and progress to your normal diet. Heavy or fried foods are harder to digest and may make you feel sick to your stomach (nauseated).  Avoid alcoholic beverages for 24 hours or as instructed.  MEDICATIONS You may resume your normal medications unless your doctor tells you otherwise.  WHAT YOU CAN EXPECT TODAY Some feelings of bloating in the abdomen.  Passage of more gas than usual.  Spotting of blood in your stool or on the toilet paper.  IF YOU HAD POLYPS REMOVED DURING THE COLONOSCOPY: No aspirin  products for 7 days or as instructed.  No alcohol  for 7 days or as instructed.  Eat a soft diet for the next 24 hours.  FINDING OUT THE RESULTS OF YOUR TEST Not all test results are available during your visit. If your test results are not back during the visit, make an appointment with your caregiver to find out the  results. Do not assume everything is normal if you have not heard from your caregiver or the medical facility. It is important for you to follow up on all of your test results.  SEEK IMMEDIATE MEDICAL ATTENTION IF: You have more than a spotting of blood in your stool.  Your belly is swollen (abdominal distention).  You are nauseated or vomiting.  You have a temperature over 101.  You have abdominal pain or discomfort that is severe or gets worse throughout the day.     No polyps found today.  It is recommended you return in 7 years for repeat colonoscopy  You may resume your Eliquis today  At patient request, I called Dontrae Morini at [phone] rolled to voicemail left detailed message.   "

## 2024-10-22 ENCOUNTER — Encounter (HOSPITAL_COMMUNITY): Payer: Self-pay | Admitting: Internal Medicine

## 2024-11-02 ENCOUNTER — Other Ambulatory Visit: Payer: Self-pay | Admitting: Internal Medicine

## 2025-01-01 ENCOUNTER — Ambulatory Visit (HOSPITAL_BASED_OUTPATIENT_CLINIC_OR_DEPARTMENT_OTHER): Admitting: Cardiology
